# Patient Record
Sex: Male | Born: 1970 | Race: Black or African American | Hispanic: No | Marital: Married | Smoking: Never smoker
Health system: Southern US, Community
[De-identification: ages and names within clinical notes are randomized; demographics above are authoritative.]

## PROBLEM LIST (undated history)

## (undated) DIAGNOSIS — M549 Dorsalgia, unspecified: Secondary | ICD-10-CM

## (undated) DIAGNOSIS — R079 Chest pain, unspecified: Secondary | ICD-10-CM

## (undated) DIAGNOSIS — I10 Essential (primary) hypertension: Secondary | ICD-10-CM

## (undated) DIAGNOSIS — E785 Hyperlipidemia, unspecified: Secondary | ICD-10-CM

## (undated) DIAGNOSIS — Z87898 Personal history of other specified conditions: Secondary | ICD-10-CM

## (undated) DIAGNOSIS — T8859XA Other complications of anesthesia, initial encounter: Secondary | ICD-10-CM

## (undated) DIAGNOSIS — E119 Type 2 diabetes mellitus without complications: Secondary | ICD-10-CM

## (undated) HISTORY — DX: Essential (primary) hypertension: I10

## (undated) HISTORY — DX: Type 2 diabetes mellitus without complications: E11.9

## (undated) HISTORY — DX: Personal history of other specified conditions: Z87.898

## (undated) HISTORY — PX: OTHER SURGICAL HISTORY: SHX169

## (undated) HISTORY — DX: Dorsalgia, unspecified: M54.9

## (undated) HISTORY — PX: LUMBAR FUSION: SHX111

## (undated) HISTORY — PX: LUMBAR LAMINECTOMY: SHX95

## (undated) HISTORY — DX: Hyperlipidemia, unspecified: E78.5

## (undated) HISTORY — DX: Chest pain, unspecified: R07.9

---

## 2000-10-07 ENCOUNTER — Ambulatory Visit (HOSPITAL_BASED_OUTPATIENT_CLINIC_OR_DEPARTMENT_OTHER): Admission: RE | Admit: 2000-10-07 | Discharge: 2000-10-08 | Payer: Self-pay | Admitting: Surgery

## 2005-02-26 ENCOUNTER — Ambulatory Visit: Payer: Self-pay | Admitting: Internal Medicine

## 2005-04-02 ENCOUNTER — Ambulatory Visit: Payer: Self-pay | Admitting: Internal Medicine

## 2005-05-08 ENCOUNTER — Ambulatory Visit: Payer: Self-pay | Admitting: Internal Medicine

## 2005-05-10 ENCOUNTER — Encounter: Admission: RE | Admit: 2005-05-10 | Discharge: 2005-08-08 | Payer: Self-pay | Admitting: Internal Medicine

## 2005-10-23 ENCOUNTER — Ambulatory Visit: Payer: Self-pay | Admitting: Internal Medicine

## 2006-01-01 ENCOUNTER — Ambulatory Visit (HOSPITAL_COMMUNITY): Admission: RE | Admit: 2006-01-01 | Discharge: 2006-01-01 | Payer: Self-pay | Admitting: Surgery

## 2006-05-26 ENCOUNTER — Ambulatory Visit: Payer: Self-pay | Admitting: Internal Medicine

## 2006-05-30 ENCOUNTER — Ambulatory Visit (HOSPITAL_COMMUNITY): Admission: RE | Admit: 2006-05-30 | Discharge: 2006-05-30 | Payer: Self-pay | Admitting: Internal Medicine

## 2006-06-04 ENCOUNTER — Ambulatory Visit (HOSPITAL_COMMUNITY): Admission: RE | Admit: 2006-06-04 | Discharge: 2006-06-04 | Payer: Self-pay | Admitting: Surgery

## 2006-06-19 ENCOUNTER — Ambulatory Visit: Payer: Self-pay | Admitting: Internal Medicine

## 2006-10-22 ENCOUNTER — Ambulatory Visit: Payer: Self-pay | Admitting: Internal Medicine

## 2007-06-11 ENCOUNTER — Telehealth: Payer: Self-pay | Admitting: *Deleted

## 2007-06-11 DIAGNOSIS — E1159 Type 2 diabetes mellitus with other circulatory complications: Secondary | ICD-10-CM | POA: Insufficient documentation

## 2007-06-11 DIAGNOSIS — E1169 Type 2 diabetes mellitus with other specified complication: Secondary | ICD-10-CM

## 2007-06-11 DIAGNOSIS — I152 Hypertension secondary to endocrine disorders: Secondary | ICD-10-CM

## 2007-06-11 DIAGNOSIS — E785 Hyperlipidemia, unspecified: Secondary | ICD-10-CM

## 2007-06-11 DIAGNOSIS — I10 Essential (primary) hypertension: Secondary | ICD-10-CM

## 2007-06-11 HISTORY — DX: Type 2 diabetes mellitus with other specified complication: E11.69

## 2007-06-11 HISTORY — DX: Hypertension secondary to endocrine disorders: I15.2

## 2007-06-26 ENCOUNTER — Ambulatory Visit: Payer: Self-pay | Admitting: Internal Medicine

## 2007-06-26 LAB — CONVERTED CEMR LAB
AST: 44 units/L — ABNORMAL HIGH (ref 0–37)
BUN: 13 mg/dL (ref 6–23)
Basophils Absolute: 0 10*3/uL (ref 0.0–0.1)
Basophils Relative: 0.5 % (ref 0.0–1.0)
Bilirubin, Direct: 0.1 mg/dL (ref 0.0–0.3)
Blood in Urine, dipstick: NEGATIVE
Calcium: 9.8 mg/dL (ref 8.4–10.5)
Cholesterol: 226 mg/dL (ref 0–200)
GFR calc non Af Amer: 56 mL/min
Glucose, Bld: 95 mg/dL (ref 70–99)
HDL: 26.3 mg/dL — ABNORMAL LOW (ref >39.0)
Hemoglobin: 13 g/dL (ref 13.0–17.0)
Ketones, urine, test strip: NEGATIVE
Lymphocytes Relative: 53.6 % — ABNORMAL HIGH (ref 12.0–46.0)
Monocytes Absolute: 0.9 10*3/uL — ABNORMAL HIGH (ref 0.2–0.7)
Neutro Abs: 2.5 10*3/uL (ref 1.4–7.7)
Neutrophils Relative %: 32.7 % — ABNORMAL LOW (ref 43.0–77.0)
Nitrite: NEGATIVE
Platelets: 231 10*3/uL (ref 150–400)
TSH: 0.98 microintl units/mL (ref 0.35–5.50)
Total Bilirubin: 1.1 mg/dL (ref 0.3–1.2)
Total CHOL/HDL Ratio: 8.6
Total Protein: 7.8 g/dL (ref 6.0–8.3)
Urobilinogen, UA: 0.2
VLDL: 29 mg/dL (ref 0–40)
WBC Urine, dipstick: NEGATIVE

## 2007-07-03 ENCOUNTER — Ambulatory Visit: Payer: Self-pay | Admitting: Internal Medicine

## 2007-07-03 DIAGNOSIS — E876 Hypokalemia: Secondary | ICD-10-CM | POA: Insufficient documentation

## 2007-07-23 ENCOUNTER — Encounter: Payer: Self-pay | Admitting: Internal Medicine

## 2007-07-23 ENCOUNTER — Telehealth: Payer: Self-pay | Admitting: Internal Medicine

## 2007-08-19 ENCOUNTER — Ambulatory Visit: Payer: Self-pay | Admitting: Internal Medicine

## 2007-08-19 DIAGNOSIS — R609 Edema, unspecified: Secondary | ICD-10-CM | POA: Insufficient documentation

## 2007-08-19 LAB — CONVERTED CEMR LAB
ALT: 38 units/L (ref 0–53)
Alkaline Phosphatase: 76 units/L (ref 39–117)
Blood in Urine, dipstick: NEGATIVE
Calcium: 9.6 mg/dL (ref 8.4–10.5)
Creatinine, Ser: 1.6 mg/dL — ABNORMAL HIGH (ref 0.4–1.5)
GFR calc Af Amer: 63 mL/min
GFR calc non Af Amer: 52 mL/min
Ketones, urine, test strip: NEGATIVE
Microalb Creat Ratio: 9.4 mg/g (ref 0.0–30.0)
Total Bilirubin: 0.7 mg/dL (ref 0.3–1.2)
Urobilinogen, UA: 0.2

## 2007-09-25 ENCOUNTER — Ambulatory Visit: Payer: Self-pay | Admitting: Internal Medicine

## 2007-09-25 DIAGNOSIS — T887XXA Unspecified adverse effect of drug or medicament, initial encounter: Secondary | ICD-10-CM | POA: Insufficient documentation

## 2007-09-25 LAB — CONVERTED CEMR LAB
Albumin: 3.9 g/dL (ref 3.5–5.2)
Basophils Relative: 0.5 % (ref 0.0–1.0)
Bilirubin, Direct: 0.1 mg/dL (ref 0.0–0.3)
Direct LDL: 171.2 mg/dL
Eosinophils Absolute: 0.2 10*3/uL (ref 0.0–0.6)
HDL: 27.5 mg/dL — ABNORMAL LOW (ref >39.0)
Hemoglobin: 13.1 g/dL (ref 13.0–17.0)
Lymphocytes Relative: 52.7 % — ABNORMAL HIGH (ref 12.0–46.0)
MCHC: 33.9 g/dL (ref 30.0–36.0)
MCV: 83.8 fL (ref 78.0–100.0)
Monocytes Absolute: 1 10*3/uL — ABNORMAL HIGH (ref 0.2–0.7)
RDW: 13.7 % (ref 11.5–14.6)
Total Bilirubin: 1 mg/dL (ref 0.3–1.2)
Total Protein: 8.1 g/dL (ref 6.0–8.3)

## 2007-10-01 ENCOUNTER — Ambulatory Visit: Payer: Self-pay | Admitting: Internal Medicine

## 2007-12-31 ENCOUNTER — Ambulatory Visit: Payer: Self-pay | Admitting: Internal Medicine

## 2007-12-31 LAB — CONVERTED CEMR LAB
ALT: 46 units/L (ref 0–53)
Alkaline Phosphatase: 79 units/L (ref 39–117)
Bilirubin, Direct: 0.2 mg/dL (ref 0.0–0.3)
Cholesterol: 138 mg/dL (ref 0–200)
LDL Cholesterol: 93 mg/dL (ref 0–99)
VLDL: 20 mg/dL (ref 0–40)

## 2008-03-09 ENCOUNTER — Ambulatory Visit: Payer: Self-pay | Admitting: Internal Medicine

## 2008-03-09 LAB — CONVERTED CEMR LAB
CO2: 30 meq/L (ref 19–32)
Calcium: 9.7 mg/dL (ref 8.4–10.5)
Chloride: 105 meq/L (ref 96–112)
GFR calc non Af Amer: 51 mL/min
Glucose, Bld: 99 mg/dL (ref 70–99)
Sodium: 143 meq/L (ref 135–145)

## 2008-05-16 ENCOUNTER — Ambulatory Visit: Payer: Self-pay | Admitting: Internal Medicine

## 2008-11-07 ENCOUNTER — Ambulatory Visit: Payer: Self-pay | Admitting: Internal Medicine

## 2009-01-19 ENCOUNTER — Ambulatory Visit: Payer: Self-pay | Admitting: Internal Medicine

## 2009-01-19 ENCOUNTER — Telehealth: Payer: Self-pay | Admitting: Internal Medicine

## 2009-01-19 DIAGNOSIS — J069 Acute upper respiratory infection, unspecified: Secondary | ICD-10-CM | POA: Insufficient documentation

## 2009-01-30 ENCOUNTER — Ambulatory Visit: Payer: Self-pay | Admitting: Internal Medicine

## 2009-01-30 LAB — CONVERTED CEMR LAB
Calcium: 9.3 mg/dL (ref 8.4–10.5)
Chloride: 106 meq/L (ref 96–112)
Cholesterol, target level: 200 mg/dL
GFR calc Af Amer: 72 mL/min
GFR calc non Af Amer: 60 mL/min
Potassium: 3.4 meq/L — ABNORMAL LOW (ref 3.5–5.1)
Sodium: 141 meq/L (ref 135–145)
Triglycerides: 101 mg/dL (ref 0–149)

## 2009-03-22 ENCOUNTER — Telehealth: Payer: Self-pay | Admitting: Internal Medicine

## 2009-05-01 ENCOUNTER — Ambulatory Visit: Payer: Self-pay | Admitting: Internal Medicine

## 2009-05-01 LAB — CONVERTED CEMR LAB
ALT: 40 units/L (ref 0–53)
AST: 38 units/L — ABNORMAL HIGH (ref 0–37)
Alkaline Phosphatase: 82 units/L (ref 39–117)
Bilirubin Urine: NEGATIVE
Eosinophils Absolute: 0.2 10*3/uL (ref 0.0–0.7)
Glucose, Urine, Semiquant: NEGATIVE
Hemoglobin: 12.8 g/dL — ABNORMAL LOW (ref 13.0–17.0)
Ketones, urine, test strip: NEGATIVE
LDL Cholesterol: 92 mg/dL (ref 0–99)
MCHC: 33.5 g/dL (ref 30.0–36.0)
MCV: 83.4 fL (ref 78.0–100.0)
Neutrophils Relative %: 37.9 % — ABNORMAL LOW (ref 43.0–77.0)
Nitrite: NEGATIVE
Platelets: 193 10*3/uL (ref 150.0–400.0)
RBC: 4.58 M/uL (ref 4.22–5.81)
RDW: 13.8 % (ref 11.5–14.6)
Sodium: 136 meq/L (ref 135–145)
Total Bilirubin: 0.8 mg/dL (ref 0.3–1.2)
Total CHOL/HDL Ratio: 5
Urobilinogen, UA: 1
VLDL: 19.6 mg/dL (ref 0.0–40.0)

## 2009-05-08 ENCOUNTER — Ambulatory Visit: Payer: Self-pay | Admitting: Internal Medicine

## 2009-05-08 DIAGNOSIS — D6489 Other specified anemias: Secondary | ICD-10-CM | POA: Insufficient documentation

## 2009-05-10 ENCOUNTER — Telehealth: Payer: Self-pay | Admitting: Internal Medicine

## 2009-08-14 ENCOUNTER — Ambulatory Visit: Payer: Self-pay | Admitting: Internal Medicine

## 2009-08-14 DIAGNOSIS — D518 Other vitamin B12 deficiency anemias: Secondary | ICD-10-CM | POA: Insufficient documentation

## 2009-08-14 LAB — CONVERTED CEMR LAB
Basophils Absolute: 0 10*3/uL (ref 0.0–0.1)
Folate: 17.6 ng/mL
Lymphs Abs: 2.4 10*3/uL (ref 0.7–4.0)
MCHC: 32.9 g/dL (ref 30.0–36.0)
Saturation Ratios: 18.9 % — ABNORMAL LOW (ref 20.0–50.0)
Vitamin B-12: 221 pg/mL (ref 211–911)
WBC: 6.7 10*3/uL (ref 4.5–10.5)

## 2010-01-19 ENCOUNTER — Ambulatory Visit: Payer: Self-pay | Admitting: Internal Medicine

## 2010-01-19 LAB — CONVERTED CEMR LAB
Basophils Absolute: 0.1 10*3/uL (ref 0.0–0.1)
Basophils Relative: 0.8 % (ref 0.0–3.0)
Eosinophils Absolute: 0.2 10*3/uL (ref 0.0–0.7)
Folate: 12.5 ng/mL
HCT: 40.3 % (ref 39.0–52.0)
Lymphocytes Relative: 40.2 % (ref 12.0–46.0)
Lymphs Abs: 3.2 10*3/uL (ref 0.7–4.0)
MCHC: 32.7 g/dL (ref 30.0–36.0)
Monocytes Absolute: 1.1 10*3/uL — ABNORMAL HIGH (ref 0.1–1.0)
Monocytes Relative: 14.3 % — ABNORMAL HIGH (ref 3.0–12.0)
RBC: 4.74 M/uL (ref 4.22–5.81)
Vitamin B-12: 300 pg/mL (ref 211–911)
WBC: 8 10*3/uL (ref 4.5–10.5)

## 2010-03-23 ENCOUNTER — Telehealth: Payer: Self-pay | Admitting: Internal Medicine

## 2010-05-14 ENCOUNTER — Ambulatory Visit: Payer: Self-pay | Admitting: Internal Medicine

## 2010-05-14 LAB — CONVERTED CEMR LAB
AST: 35 units/L (ref 0–37)
Albumin: 4.2 g/dL (ref 3.5–5.2)
Alkaline Phosphatase: 96 units/L (ref 39–117)
Bilirubin, Direct: 0.1 mg/dL (ref 0.0–0.3)
Blood in Urine, dipstick: NEGATIVE
Calcium: 9.2 mg/dL (ref 8.4–10.5)
Glucose, Urine, Semiquant: NEGATIVE
HCT: 39.3 % (ref 39.0–52.0)
Hemoglobin: 13 g/dL (ref 13.0–17.0)
Lymphocytes Relative: 47.4 % — ABNORMAL HIGH (ref 12.0–46.0)
Lymphs Abs: 3.4 10*3/uL (ref 0.7–4.0)
MCHC: 33.2 g/dL (ref 30.0–36.0)
MCV: 84.5 fL (ref 78.0–100.0)
Monocytes Relative: 12.1 % — ABNORMAL HIGH (ref 3.0–12.0)
Neutrophils Relative %: 37.6 % — ABNORMAL LOW (ref 43.0–77.0)
Nitrite: NEGATIVE
PSA: 1.57 ng/mL (ref 0.10–4.00)
Platelets: 225 10*3/uL (ref 150.0–400.0)
RDW: 14.9 % — ABNORMAL HIGH (ref 11.5–14.6)
Specific Gravity, Urine: >=1.03
TSH: 1.12 microintl units/mL (ref 0.35–5.50)
Total CHOL/HDL Ratio: 5
Triglycerides: 86 mg/dL (ref 0.0–149.0)
WBC Urine, dipstick: NEGATIVE
pH: 5.5

## 2010-05-18 ENCOUNTER — Ambulatory Visit: Payer: Self-pay | Admitting: Internal Medicine

## 2010-10-23 ENCOUNTER — Telehealth: Payer: Self-pay | Admitting: Internal Medicine

## 2010-11-05 ENCOUNTER — Ambulatory Visit: Payer: Self-pay | Admitting: Internal Medicine

## 2010-11-05 LAB — CONVERTED CEMR LAB
ALT: 44 units/L (ref 0–53)
LDL Cholesterol: 89 mg/dL (ref 0–99)
Total CHOL/HDL Ratio: 5

## 2010-11-20 ENCOUNTER — Ambulatory Visit
Admission: RE | Admit: 2010-11-20 | Discharge: 2010-11-20 | Payer: Self-pay | Source: Home / Self Care | Attending: Internal Medicine | Admitting: Internal Medicine

## 2010-12-16 LAB — CONVERTED CEMR LAB
Folate: >20 ng/mL
Hgb F Quant: 0 % (ref 0.0–2.0)
Hgb S Quant: 0 % (ref 0.0–0.0)
Iron: 96 ug/dL (ref 42–165)
Transferrin: 251.7 mg/dL (ref 212.0–360.0)
Vitamin B-12: 226 pg/mL (ref 211–911)

## 2010-12-18 NOTE — Progress Notes (Signed)
Summary: REQUEST FOR SAMPLES / Rx  Phone Note Call from Patient   Caller: Patient   [phone] Summary of Call: Pt would like to have samples of Lipitor 20mg  if available..... If no samples are available then he would like to have a Rx for Lipitor 20mg  (30-day supply) [facility] [address]Marland KitchenMarland Kitchen Pt has changed insurance and his new policy will not go into effect for one more week / 7 days.Marland KitchenMarland KitchenTherefore, he is only requesting samples or Rx for enough of med to do him till his insurance goes into effect.  Initial call taken by: Debbra Riding,  October 23, 2010 3:30 PM    Prescriptions: LIPITOR 20 MG  TABS (ATORVASTATIN CALCIUM) one by mouth daily  #7 x 2   Entered by:   Willy Eddy, LPN   Authorized by:   Stacie Glaze MD   Signed by:   Willy Eddy, LPN on 09/81/1914   Method used:   [facility]ically [facility]        [facility] [address] [phone]* (retail)       [address]       [place], [place]  [address]       Ph: [phone] or [phone]       Fax: [phone]   RxID:   [id number]

## 2010-12-18 NOTE — Letter (Signed)
Summary: Out of Work  Adult nurse at [employer]  [address]   [place], [place] [address]   Phone: [phone]  Fax: [phone]    January 19, 2010   Employee:  Vincent Cantu    To Whom It May Concern:   For Medical reasons, please excuse the above named employee from work for the following dates:  Start:   01-16-2010  End:   01-19-2010  If you need additional information, please feel free to contact our office.         Sincerely,    Raechel Ache, RN

## 2010-12-18 NOTE — Assessment & Plan Note (Signed)
Summary: cpx/njr ok to create a slot/njr   Vital Signs:  Patient profile:   40 year old male Height:      75 inches Weight:      326 pounds BMI:     40.89 Temp:     98.2 degrees F oral Pulse rate:   76 / minute Resp:     14 per minute BP sitting:   124 / 80  (left arm) Cuff size:   large  Vitals Entered By: Willy Eddy, LPN (May 18, 6044 8:55 AM) CC: cpx   CC:  cpx.  History of Present Illness: The pt was asked about all immunizations, health maint. 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) 2)  Other Specified Anemias  (ICD-285.8) 3)  Uri  (ICD-465.9) 4)  Uns Advrs Eff Uns Rx Medicinal&biological Sbstnc  (ICD-995.20) 5)  Symptom, Edema  (ICD-782.3) 6)  Preventive Health Care  (ICD-V70.0) 7)  Hypokalemia, Mild  (ICD-276.8) 8)  Family History Seizures  (ICD-V17.2) 9)  Hypertension  (ICD-401.9) 10)  Hyperlipidemia  (ICD-272.4)  Current Problems (verified): 1)  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 Once Daily 2)  Hydrochlorothiazide 25 Mg  Tabs (Hydrochlorothiazide) .... Once Daily 3)  Azor 10-40 Mg Tabs (Amlodipine-Olmesartan) .Marland Kitchen.. 1 Once Daily 4)  Lipitor 20 Mg  Tabs (Atorvastatin Calcium) .... One By Mouth Daily 5)  Folbee 2.5-25-1 Mg Tabs (Folic Acid-Vit B6-Vit  B12) .... One By Mouth Daily  Current Medications (verified): 1)  Toprol Xl 100 Mg  Tb24 (Metoprolol Succinate) .... Once Daily 2)  Hydrochlorothiazide 25 Mg  Tabs (Hydrochlorothiazide) .... Once Daily 3)  Azor 10-40 Mg Tabs (Amlodipine-Olmesartan) .Marland Kitchen.. 1 Once Daily 4)  Lipitor 20 Mg  Tabs (Atorvastatin Calcium) .... One By Mouth Daily 5)  Folbee 2.5-25-1 Mg Tabs (Folic Acid-Vit B6-Vit B12) .... One By Mouth Daily  Allergies (verified): No Known Drug Allergies  Past History:  Family History: Last updated: 08/19/2007 Family History Hypertension Family History Seizures No family hx of DM or renal dz  Social History: Last updated: 06/11/2007 Occupation:[employer] Never Smoked Alcohol use-no  Risk Factors: Smoking Status: never (05/18/2010)  Past medical, surgical, family and social histories (including risk factors) reviewed, and no changes noted (except as noted below).  Past Medical History: Reviewed history from 06/11/2007 and no changes required. Hyperlipidemia Hypertension  Past Surgical History: Reviewed history from 07/03/2007 and no changes required. Inguinal herniorrhaphy Lumbar laminectomy Lumbar fusion arthroscopic to knee for meniscus tear  Family History: Reviewed history from 08/19/2007 and no changes required. Family History Hypertension Family History Seizures No family hx of DM or renal dz  Social History: Reviewed history from 06/11/2007 and no changes required. Occupation:[employer] Never Smoked Alcohol use-no  Review of Systems  The patient denies anorexia,  fever, weight loss, weight gain, vision loss, decreased hearing, hoarseness, chest pain, syncope, dyspnea on exertion, peripheral edema, prolonged cough, headaches, hemoptysis, abdominal pain, melena, hematochezia, severe indigestion/heartburn, hematuria, incontinence, genital sores, muscle weakness, suspicious skin lesions, transient blindness, difficulty  walking, depression, unusual weight change, abnormal bleeding, enlarged lymph nodes, angioedema, breast masses, and testicular masses.    Physical Exam  General:  overweight-appearing.  120/88 Head:  Normocephalic and atraumatic without obvious abnormalities. No apparent alopecia or balding. Eyes:  No corneal or conjunctival inflammation noted. EOMI. Perrla. Funduscopic exam benign, without hemorrhages, exudates or papilledema. Vision grossly normal. Ears:  R ear normal and L ear normal.   Nose:  no external deformity and no nasal discharge.   Neck:  No deformities, masses, or tenderness noted. Lungs:  Normal respiratory effort, chest expands symmetrically. Lungs are clear to auscultation, no crackles or wheezes. Heart:  Normal rate and regular rhythm. S1 and S2 normal without gallop, murmur, click, rub or other extra sounds. Msk:  No deformity or scoliosis noted of thoracic or lumbar spine.   Extremities:  No clubbing, cyanosis, edema, or deformity noted with normal full range of motion of all joints.   Neurologic:  No cranial nerve deficits noted. Station and gait are normal. Plantar reflexes are down-going bilaterally. DTRs are symmetrical throughout. Sensory, motor and coordinative functions appear intact.   Impression & Recommendations:  Problem # 1:  PREVENTIVE HEALTH CARE (ICD-V70.0) The pt was asked about all immunizations, health maint. services that are appropriate to their age and was given guidance on diet exercize  and weight management  Td Booster: Td (11/18/2002)   Flu Vax: Fluvax 3+ (08/14/2009)   Chol: 145 (01/19/2010)   HDL: 33.30 (01/19/2010)   LDL: 92 (05/01/2009)   TG: 98.0 (05/01/2009) TSH: 1.14 (05/01/2009)   PSA: 1.09 (05/01/2009)  Discussed using sunscreen, use of alcohol, drug use, self testicular exam, routine dental care, routine eye care, routine physical exam, seat belts, multiple vitamins, osteoporosis prevention, adequate calcium intake in diet, and  recommendations for immunizations.  Discussed exercise and checking cholesterol.  Discussed gun safety, safe sex, and contraception. Also recommend checking PSA.  Problem # 2:  HYPOKALEMIA, MILD (ICD-276.8) resolved  Complete Medication List: 1)  Toprol Xl 100 Mg Tb24 (Metoprolol succinate) .... Once daily 2)  Hydrochlorothiazide 25 Mg Tabs (Hydrochlorothiazide) .... Once daily 3)  Azor 10-40 Mg Tabs (Amlodipine-olmesartan) .Marland Kitchen.. 1 once daily 4)  Lipitor 20 Mg Tabs (Atorvastatin calcium) .... One by mouth daily 5)  Folbee 2.5-25-1 Mg Tabs (Folic acid-vit b6-vit b12) .... One by mouth daily  Patient Instructions: 1)  Please schedule a follow-up appointment in 6 months. 2)  Hepatic Panel prior to visit, ICD-9:995.20 3)  Lipid Panel prior to visit, ICD-9:272.4   Prevention & Chronic Care Immunizations   Influenza vaccine: Fluvax 3+  (08/14/2009)   Influenza vaccine due: 07/19/2010    Tetanus booster: 11/18/2002: Td   Tetanus booster due: 11/18/2012    Pneumococcal vaccine: Not documented  Other Screening   Smoking status: never  (05/18/2010)  Lipids   Total Cholesterol: 145  (01/19/2010)   Lipid panel action/deferral: Lipid Panel ordered   LDL: 92  (05/01/2009)   LDL Direct: 110.0  (01/19/2010)   HDL: 33.30  (01/19/2010)   Triglycerides: 98.0  (05/01/2009)    SGOT (AST): 38  (05/01/2009)   SGPT (ALT): 40  (05/01/2009)   Alkaline phosphatase: 82  (05/01/2009)   Total bilirubin: 0.8  (05/01/2009)    Lipid flowsheet reviewed?: Yes  Progress toward LDL goal: At goal    Stage of readiness to change (lipid management): Action  Hypertension   Last Blood Pressure: 124 / 80  (05/18/2010)   Serum creatinine: 1.6  (05/01/2009)   Serum potassium 3.4  (05/01/2009)    Hypertension flowsheet reviewed?: Yes   Progress toward BP goal: At goal  Self-Management Support :    Patient will work on the following items until the next clinic visit to reach self-care goals:      Medications and monitoring: take my medicines every day  (05/18/2010)     Eating: use fresh or frozen vegetables  (05/18/2010)     Activity: take a 30 minute walk every day  (05/18/2010)    Hypertension self-management support: BP self-monitoring log  (05/18/2010)    Lipid self-management support: Not documented

## 2010-12-18 NOTE — Progress Notes (Signed)
Summary: refill metoprolol  Phone Note Refill Request Message from:  Fax from Pharmacy on Mar 23, 2010 9:56 AM  Refills Requested: Medication #1:  TOPROL XL 100 MG  TB24 once daily [facility] fax   Method Requested: Fax to Local Pharmacy Initial call taken by: Duard Brady LPN,  Mar 23, 4009 9:57 AM    Prescriptions: TOPROL XL 100 MG  TB24 (METOPROLOL SUCCINATE) once daily  #90 x 3   Entered by:   Duard Brady LPN   Authorized by:   Gordy Savers  MD   Signed by:   Duard Brady LPN on 27/25/3664   Method used:   Faxed to ...       [facility]* (mail-order)             ,          Ph: [phone]       Fax: [phone]   RxID:   [id number]

## 2010-12-18 NOTE — Assessment & Plan Note (Signed)
Summary: 6 month rov/njr   Vital Signs:  Patient profile:   40 year old male Weight:      329 pounds BMI:     41.27 Temp:     98.4 degrees F oral BP sitting:   110 / 80  (left arm) Cuff size:   large  Vitals Entered By: Raechel Ache, RN (January 19, 2010 12:02 PM) CC: 6 mo F/u; c/o some congestion. Needs FMLA., Hypertension Management, Lipid Management   CC:  6 mo F/u; c/o some congestion. Needs FMLA., Hypertension Management, and Lipid Management.  Hypertension History:      He denies headache, chest pain, palpitations, dyspnea with exertion, orthopnea, PND, peripheral edema, visual symptoms, neurologic problems, syncope, and side effects from treatment.        Positive major cardiovascular risk factors include hyperlipidemia and hypertension.  Negative major cardiovascular risk factors include male age less than 26 years old and non-tobacco-user status.        Further assessment for target organ damage reveals no history of ASHD, stroke/TIA, or peripheral vascular disease.    Lipid Management History:      Positive NCEP/ATP III risk factors include HDL cholesterol less than 40 and hypertension.  Negative NCEP/ATP III risk factors include male age less than 16 years old, non-tobacco-user status, no ASHD (atherosclerotic heart disease), no prior stroke/TIA, no peripheral vascular disease, and no history of aortic aneurysm.      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 Once Daily 2)  Hydrochlorothiazide 25 Mg  Tabs  (Hydrochlorothiazide) .... Once Daily 3)  Azor 10-40 Mg Tabs (Amlodipine-Olmesartan) .Marland Kitchen.. 1 Once Daily 4)  Lipitor 20 Mg  Tabs (Atorvastatin Calcium) .... One By Mouth Daily  Current Medications (verified): 1)  Toprol Xl 100 Mg  Tb24 (Metoprolol Succinate) .... Once Daily 2)  Hydrochlorothiazide 25 Mg  Tabs (Hydrochlorothiazide) .... Once Daily 3)  Azor 10-40 Mg Tabs (Amlodipine-Olmesartan) .Marland Kitchen.. 1 Once Daily 4)  Lipitor 20 Mg  Tabs (Atorvastatin Calcium) .... One By Mouth Daily 5)  Folbee 2.5-25-1 Mg Tabs (Folic Acid-Vit B6-Vit B12) .... One By Mouth Daily  Allergies (verified): No Known Drug Allergies  Past History:  Family History: Last updated: 08/19/2007 Family History Hypertension Family History Seizures No family hx of DM or renal dz  Social History: Last updated: 06/11/2007 Occupation:[employer] Never Smoked Alcohol use-no  Risk Factors: Smoking Status: never (06/11/2007)  Past medical, surgical, family and social histories (including risk factors) reviewed, and no changes noted (except as noted below).  Past Medical History: Reviewed history from 06/11/2007 and no changes required. Hyperlipidemia Hypertension  Past Surgical History: Reviewed history from 07/03/2007 and no changes required. Inguinal herniorrhaphy Lumbar laminectomy Lumbar fusion arthroscopic to knee for meniscus tear  Family History: Reviewed history from 08/19/2007 and no changes required. Family History Hypertension Family History Seizures No family hx of DM or renal dz  Social History: Reviewed history from 06/11/2007 and no changes required. Occupation:[employer] Never Smoked Alcohol use-no  Review of  Systems  The patient denies anorexia, fever, weight loss, weight gain, vision loss, decreased hearing, hoarseness, chest pain, syncope, dyspnea on exertion, peripheral edema, prolonged cough, headaches, hemoptysis, abdominal pain, melena, hematochezia,  severe indigestion/heartburn, hematuria, incontinence, genital sores, muscle weakness, suspicious skin lesions, transient blindness, difficulty walking, depression, unusual weight change, abnormal bleeding, enlarged lymph nodes, angioedema, breast masses, and testicular masses.    Physical Exam  General:  overweight-appearing.  120/88 Head:  Normocephalic and atraumatic without obvious abnormalities. No apparent alopecia or balding. Eyes:  No corneal or conjunctival inflammation noted. EOMI. Perrla. Funduscopic exam benign, without hemorrhages, exudates or papilledema. Vision grossly normal. Ears:  R ear normal and L ear normal.   Nose:  no external deformity and no nasal discharge.   Lungs:  Normal respiratory effort, chest expands symmetrically. Lungs are clear to auscultation, no crackles or wheezes. Heart:  Normal rate and regular rhythm. S1 and S2 normal without gallop, murmur, click, rub or other extra sounds. Genitalia:  Testes bilaterally descended without nodularity, tenderness or masses. No scrotal masses or lesions. No penis lesions or urethral discharge. Prostate:  Prostate gland firm and smooth, no enlargement, nodularity, tenderness, mass, asymmetry or induration.   Impression & Recommendations:  Problem # 1:  OTHER VITAMIN B12 DEFICIENCY ANEMIA (ICD-281.1)  Hgb: 13.1 (08/14/2009)   Hct: 39.8 (08/14/2009)   Platelets: 193.0 (08/14/2009) RBC: 4.73 (08/14/2009)   RDW: 13.8 (08/14/2009)   WBC: 6.7 (08/14/2009) MCV: 84.2 (08/14/2009)   MCHC: 32.9 (08/14/2009) Iron: 61 (08/14/2009)   % Sat: 18.9 (08/14/2009) B12: 221 (08/14/2009)   Folate: 17.6 (08/14/2009)   TSH: 1.14 (05/01/2009)  Orders: Venipuncture ([address]) TLB-B12 + Folate Pnl (82746_82607-B12/FOL)  His updated medication list for this problem includes:    Folbee 2.5-25-1 Mg Tabs (Folic acid-vit b6-vit b12) ..... One by mouth daily  Problem # 2:  HYPERTENSION (ICD-401.9)  His updated medication list for this problem  includes:    Toprol Xl 100 Mg Tb24 (Metoprolol succinate) ..... Once daily    Hydrochlorothiazide 25 Mg Tabs (Hydrochlorothiazide) ..... Once daily    Azor 10-40 Mg Tabs (Amlodipine-olmesartan) .Marland Kitchen... 1 once daily  Orders: TLB-CBC Platelet - w/Differential (85025-CBCD)  BP today: 110/80 Prior BP: 136/84 (08/14/2009)  Prior 10 Yr Risk Heart Disease: 6 % (01/30/2009)  Labs Reviewed: K+: 3.4 (05/01/2009) Creat: : 1.6 (05/01/2009)   Chol: 138 (05/01/2009)   HDL: 26.90 (05/01/2009)   LDL: 92 (05/01/2009)   TG: 98.0 (05/01/2009)  Problem # 3:  HYPERLIPIDEMIA (ICD-272.4)  His updated medication list for this problem includes:    Lipitor 20 Mg Tabs (Atorvastatin calcium) ..... One by mouth daily  Orders: TLB-Cholesterol, HDL (83718-HDL) TLB-Cholesterol, Direct LDL (83721-DIRLDL) TLB-Cholesterol, Total (82465-CHO)  Labs Reviewed: SGOT: 38 (05/01/2009)   SGPT: 40 (05/01/2009)  Lipid Goals: Chol Goal: 200 (01/30/2009)   HDL Goal: 40 (01/30/2009)   LDL Goal: 130 (01/30/2009)   TG Goal: 150 (01/30/2009)  Prior 10 Yr Risk Heart Disease: 6 % (01/30/2009)   HDL:26.90 (05/01/2009), 23.8 (01/30/2009)  LDL:92 (05/01/2009), 94 (01/30/2009)  Chol:138 (05/01/2009), 138 (01/30/2009)  Trig:98.0 (05/01/2009), 101 (01/30/2009)  Complete Medication List: 1)  Toprol Xl 100 Mg Tb24 (Metoprolol succinate) .... Once daily 2)  Hydrochlorothiazide 25 Mg Tabs (Hydrochlorothiazide) .... Once daily 3)  Azor 10-40 Mg Tabs (Amlodipine-olmesartan) .Marland Kitchen.. 1 once daily 4)  Lipitor 20 Mg Tabs (Atorvastatin calcium) .... One by mouth daily 5)  Folbee 2.5-25-1 Mg Tabs (Folic acid-vit b6-vit b12) .... One by mouth daily  Hypertension Assessment/Plan:  The patient's hypertensive risk group is category B: At least one risk factor (excluding diabetes) with no target organ damage.  His calculated 10 year risk of coronary heart disease is 4 %.  Today's blood pressure is 110/80.  His blood pressure goal is <  140/90.  Lipid Assessment/Plan:      Based on NCEP/ATP III, the patient's risk factor category is "2 or more risk factors and a calculated 10 year CAD risk of < 20%".  The patient's lipid goals are as follows: Total cholesterol goal is 200; LDL cholesterol goal is 130; HDL cholesterol goal is 40; Triglyceride goal is 150.  His LDL cholesterol goal has been met.    Patient Instructions: 1)  June and August  range for CPX may place at 8 am OR 9 am ON DAYS i START AT 9  FOR 30 MG MAY CREATE CPX SPOT Prescriptions: FOLBEE 2.5-25-1 MG TABS (FOLIC ACID-VIT B6-VIT B12) one by mouth daily  #30 x 11   Entered and Authorized by:   Stacie Glaze MD   Signed by:   Stacie Glaze MD on 01/19/2010   Method used:   [facility]        [facility] [address] [phone]* (retail)       [address]       [place], [place]  [address]       Ph: [phone] or [phone]       Fax: [phone]   RxID:   [id number]

## 2010-12-20 NOTE — Assessment & Plan Note (Signed)
Summary: 6 month follow up/cjr----PT RSC (BMP) // RS   Vital Signs:  Patient profile:   40 year old male Height:      75 inches Weight:      322 pounds BMI:     40.39 Temp:     98.2 degrees F oral Pulse rate:   68 / minute Resp:     14 per minute BP sitting:   140 / 88  (left arm) Cuff size:   large  Vitals Entered By: Willy Eddy, LPN (November 20, 2010 9:19 AM) CC: roa -labs- has not taken meds this am, Lipid Management Is Patient Diabetic? No   Primary Care Provider:  Stacie Glaze MD  CC:  roa -labs- has not taken meds this am and Lipid Management.  History of Present Illness:  Hyperlipidemia Follow-Up      This is a 40 year old man who presents for Hyperlipidemia follow-up.  The patient denies muscle aches, GI upset, abdominal pain, flushing, itching, constipation, diarrhea, and fatigue.  The patient denies the following symptoms: chest pain/pressure, exercise intolerance, dypsnea, palpitations, syncope, and pedal edema.  Compliance with medications (by patient report) has been near 100%.  Dietary compliance has been excellent.  The patient reports exercising daily.  Adjunctive measures currently used by the patient include fish oil supplements.    Lipid Management History:      Positive NCEP/ATP III risk factors include HDL cholesterol less than 40 and hypertension.  Negative NCEP/ATP III risk factors include male age less than 63 years old, non-tobacco-user status, no ASHD (atherosclerotic heart disease), no prior stroke/TIA, no peripheral vascular disease, and no history of aortic aneurysm.     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 Once Daily 2)  Hydrochlorothiazide 25 Mg  Tabs (Hydrochlorothiazide) .... Once Daily 3)  Azor 10-40 Mg Tabs (Amlodipine-Olmesartan) .Marland Kitchen.. 1 Once Daily 4)  Lipitor 20 Mg  Tabs (Atorvastatin Calcium) .... One By Mouth Daily 5)  Folbee 2.5-25-1 Mg Tabs (Folic Acid-Vit B6-Vit B12) .... One By Mouth Daily  Allergies (verified): No Known Drug Allergies  Past History:  Family History: Last updated: 08/19/2007 Family History Hypertension Family History Seizures No family hx of DM or renal dz  Social History: Last updated: 06/11/2007 Occupation:[employer] Single Never Smoked Alcohol use-no  Risk Factors: Smoking Status: never (11/20/2010)  Past medical, surgical, family and social histories (including risk factors) reviewed, and no changes noted (except as noted below).  Past Medical History: Reviewed history from 06/11/2007 and no changes required. Hyperlipidemia Hypertension  Past Surgical History: Reviewed history from 07/03/2007 and no changes required. Inguinal herniorrhaphy Lumbar laminectomy Lumbar fusion arthroscopic to knee for meniscus tear  Family History: Reviewed history from 08/19/2007 and no changes required. Family History Hypertension Family History Seizures No family hx of DM or renal dz  Social History: Reviewed history from 06/11/2007 and no changes  required. Occupation:[employer] Single Never Smoked Alcohol use-no  Review of Systems  The patient denies anorexia, fever, weight loss, weight gain, vision loss, decreased hearing, hoarseness, chest pain, syncope, dyspnea on exertion, peripheral edema, prolonged cough, headaches, hemoptysis, abdominal  pain, melena, hematochezia, severe indigestion/heartburn, hematuria, incontinence, genital sores, muscle weakness, suspicious skin lesions, transient blindness, difficulty walking, depression, unusual weight change, abnormal bleeding, enlarged lymph nodes, angioedema, breast masses, and testicular masses.    Physical Exam  General:  overweight-appearing.  Head:  Normocephalic and atraumatic without obvious abnormalities. No apparent alopecia or balding. Eyes:  No corneal or conjunctival inflammation noted. EOMI. Perrla. Funduscopic exam benign, without hemorrhages, exudates or papilledema. Vision grossly normal. Nose:  no external deformity and no nasal discharge.   Neck:  No deformities, masses, or tenderness noted. Lungs:  Normal respiratory effort, chest expands symmetrically. Lungs are clear to auscultation, no crackles or wheezes. Heart:  Normal rate and regular rhythm. S1 and S2 normal without gallop, murmur, click, rub or other extra sounds. Abdomen:  soft and normal bowel sounds.   Msk:  No deformity or scoliosis noted of thoracic or lumbar spine.   Pulses:  R and L carotid,radial,femoral,dorsalis pedis and posterior tibial pulses are full and equal bilaterally Extremities:  No clubbing, cyanosis, edema, or deformity noted with normal full range of motion of all joints.     Impression & Recommendations:  Problem # 1:  HYPERTENSION (ICD-401.9) Assessment Deteriorated missed dose this AM but has been stable His updated medication list for this problem includes:    Toprol Xl 100 Mg Tb24 (Metoprolol succinate) ..... Once daily    Hydrochlorothiazide 25 Mg Tabs (Hydrochlorothiazide) ..... Once daily    Azor 10-40 Mg Tabs (Amlodipine-olmesartan) .Marland Kitchen... 1 once daily  BP today: 140/88 Prior BP: 124/80 (05/18/2010)  10 Yr Risk Heart Disease: 6 % Prior 10 Yr Risk Heart Disease: 4 % (01/19/2010)  Labs Reviewed: K+: 3.8 (05/14/2010) Creat: : 1.5 (05/14/2010)   Chol: 141 (11/05/2010)    HDL: 28.20 (11/05/2010)   LDL: 89 (11/05/2010)   TG: 117.0 (11/05/2010)  Problem # 2:  HYPERLIPIDEMIA (ICD-272.4) Assessment: Improved  His updated medication list for this problem includes:    Lipitor 20 Mg Tabs (Atorvastatin calcium) ..... One by mouth daily  Labs Reviewed: SGOT: 33 (11/05/2010)   SGPT: 44 (11/05/2010)  Lipid Goals: Chol Goal: 200 (01/30/2009)   HDL Goal: 40 (01/30/2009)   LDL Goal: 130 (01/30/2009)   TG Goal: 150 (01/30/2009)  10 Yr Risk Heart Disease: 6 % Prior 10 Yr Risk Heart Disease: 4 % (01/19/2010)   HDL:28.20 (11/05/2010), 29.50 (05/14/2010)  LDL:89 (11/05/2010), 105 (04/54/0981)  Chol:141 (11/05/2010), 152 (05/14/2010)  Trig:117.0 (11/05/2010), 86.0 (05/14/2010)  Problem # 3:  SYMPTOM, EDEMA (ICD-782.3) Assessment: Unchanged resolved His updated medication list for this problem includes:    Hydrochlorothiazide 25 Mg Tabs (Hydrochlorothiazide) ..... Once daily  Discussed elevation of the legs, use of compression stockings, sodium restiction, and medication use.   Problem # 4:  OTHER VITAMIN B12 DEFICIENCY ANEMIA (ICD-281.1) Assessment: Unchanged  His updated medication list for this problem includes:    Folbee 2.5-25-1 Mg Tabs (Folic acid-vit b6-vit b12) ..... One by mouth daily  Hgb: 13.0 (05/14/2010)   Hct: 39.3 (05/14/2010)   Platelets: 225.0 (05/14/2010) RBC: 4.65 (05/14/2010)   RDW: 14.9 (05/14/2010)   WBC: 7.1 (05/14/2010) MCV: 84.5 (05/14/2010)   MCHC: 33.2 (05/14/2010) Iron: 61 (08/14/2009)   % Sat: 18.9 (08/14/2009) B12: 300 (01/19/2010)   Folate: 12.5 (01/19/2010)  TSH: 1.12 (05/14/2010)  Complete Medication List: 1)  Toprol Xl 100 Mg Tb24 (Metoprolol succinate) .... Once daily 2)  Hydrochlorothiazide 25 Mg Tabs (Hydrochlorothiazide) .... Once daily 3)  Azor 10-40 Mg Tabs (Amlodipine-olmesartan) .Marland Kitchen.. 1 once daily 4)  Lipitor 20 Mg Tabs (Atorvastatin calcium) .... One by mouth daily 5)  Folbee 2.5-25-1 Mg Tabs (Folic acid-vit b6-vit b12)  .... One by mouth daily  Lipid Assessment/Plan:      Based on NCEP/ATP III, the patient's risk factor category is "2 or more risk factors and a calculated 10 year CAD risk of < 20%".  The patient's lipid goals are as follows: Total cholesterol goal is 200; LDL cholesterol goal is 130; HDL cholesterol goal is 40; Triglyceride goal is 150.  His LDL cholesterol goal has been met.    Patient Instructions: 1)  July for CPX   Orders Added: 1)  Est. Patient Level IV [47829]  Appended Document: Orders Update    Clinical Lists Changes  Orders: Added new Service order of Admin 1st Vaccine (56213) - Signed Added new Service order of Flu Vaccine 15yrs + [id number]) - Signed Observations: Added new observation of ROS: Flu Vaccine Consent Questions     Do you have a history of severe allergic reactions to this vaccine? no    Any prior history of allergic reactions to egg and/or gelatin? no    Do you have a sensitivity to the preservative Thimersol? no    Do you have a past history of Guillan-Barre Syndrome? no    Do you currently have an acute febrile illness? no    Have you ever had a severe reaction to latex? no    Vaccine information given and explained to patient? yes    Are you currently pregnant? no    Lot Number:[id number]   Exp Date:05/18/2011   Site Given  Left Deltoid IM given by C.Wyrick,cma (11/20/2010 11:07) Added new observation of FLU VAX VIS: 06/12/10 version (11/20/2010 11:07) Added new observation of FLU VAXLOT: [id number] (11/20/2010 11:07) Added new observation of FLU VAXMFR: [employer] (11/20/2010 11:07) Added new observation of FLU VAX EXP: 05/18/2011 (11/20/2010 11:07) Added new observation of FLU VAX DSE: 0.63ml (11/20/2010 11:07) Added new observation of FLU VAX: Fluvax 3+ (11/20/2010 11:07)       Orders Added: 1)  Admin 1st Vaccine [90471] 2)  Flu Vaccine 71yrs + [41324]   Review of Systems       Flu Vaccine Consent Questions     Do you have a history of  severe allergic reactions to this vaccine? no    Any prior history of allergic reactions to egg and/or gelatin? no    Do you have a sensitivity to the preservative Thimersol? no    Do you have a past history of Guillan-Barre Syndrome? no    Do you currently have an acute febrile illness? no    Have you ever had a severe reaction to latex? no    Vaccine information given and explained to patient? yes    Are you currently pregnant? no    Lot Number:[id number]   Exp Date:05/18/2011   Site Given  Left Deltoid IM given by C.Wyrick,cma

## 2011-01-13 ENCOUNTER — Other Ambulatory Visit: Payer: Self-pay | Admitting: Internal Medicine

## 2011-02-01 ENCOUNTER — Other Ambulatory Visit: Payer: Self-pay | Admitting: Internal Medicine

## 2011-04-05 NOTE — Op Note (Signed)
[name], Vincent Cantu               ACCOUNT NO.:  [id number]   MEDICAL RECORD NO.:  [id number]          PATIENT TYPE:  AMB   LOCATION:  DAY                          FACILITY:  [facility]   PHYSICIAN:  Thornton Park. Daphine Deutscher, MD  DATE OF BIRTH:  [birth year]   DATE OF PROCEDURE:  01/01/2006  DATE OF DISCHARGE:                                 OPERATIVE REPORT   PREOPERATIVE DIAGNOSIS:  Large umbilical hernia.   POSTOPERATIVE DIAGNOSIS:  Fairly complex umbilical hernia with status post  repair with ventral X-8-cm mesh ring.   SURGEON:  Thornton Park. Daphine Deutscher, MD   ANESTHESIA:  General endotracheal.   DESCRIPTION OF PROCEDURE:  Lawson Mahone was brought to the OR January 01, 2006 and given general anesthesia. The abdomen was prepped widely with  chlorhexidine and draped sterilely. I made a curvilinear incision beneath  his umbilicus and then elevated the umbilical skin off of a very large  complex umbilical hernia. It had a portion going cephalad; and a portion  going to the right, that were separate pockets. I reduced a lot of omental  fat that was up in it; and cleaned it back to where it was one ring. I then  inserted a piece of ventral X-mesh and sutured it with at least seven  horizontal mattress sutures of #0 Prolene through the intercises of the  polypropylene on the inside and this seemed to  deploy nicely into the the  abdomen with the Gore-Tex facing the bowel and the polypropylene side  sutured to the upper fascia. I went around this and sutured it in nicely. It  was well secured. There was no bleeding noted. I then tacked the umbilical  skin down to the fascia and closed the skin with 4-0 Vicryl with Benzoin and  Steri-Strips. The patient seemed to tolerate the procedure well. He was  given an abdominal binder. I also injected the area with Marcaine at the end  of the case. He will be given Percocet to take for pain; and will be  discharged home.      Thornton Park Daphine Deutscher, MD  Electronically Signed     MBM/MEDQ  D:  01/01/2006  T:  01/01/2006  Job:  [id number]   cc:   Stacie Glaze, M.D. [facility]  [address] [place]  [place] [address]

## 2011-04-05 NOTE — Op Note (Signed)
[name], Vincent Cantu               ACCOUNT NO.:  [id number]   MEDICAL RECORD NO.:  [id number]          PATIENT TYPE:  AMB   LOCATION:  SDS                          FACILITY:  [facility]   PHYSICIAN:  Thornton Park. Daphine Deutscher, MD  DATE OF BIRTH:  04/10/1971   DATE OF PROCEDURE:  06/04/2006  DATE OF DISCHARGE:                                 OPERATIVE REPORT   PREOPERATIVE DIAGNOSIS:  Recurrent umbilical hernia with incarceration of  omentum.   POSTOPERATIVE DIAGNOSIS:  Recurrent umbilical hernia with incarceration of  omentum.   PROCEDURE:  Removal of Ventralex mesh patch and repair with Prolite mesh  anteriorly.   SURGEON:  Thornton Park. Daphine Deutscher, MD   ASSISTANT:  None.   ANESTHESIA:  General.   DESCRIPTION OF PROCEDURE:  Mr. Goldman was taken to room 17 at [facility] on  06/04/06 and given general anesthesia.  A curvilinear incision was made  removing his old incision.  I carried this down and then had to dissect free  the umbilical skin from the incarcerated hernia containing fat by CT scan.  I got around this and was able to reduce the fat and then grasp the edges of  fascia and Bovied backward to enter the fascia to get a good rim of fascia  present.  I found the mesh had torn out inferiorly and had rotated upward  allowing this hernia to recur.  With some tedious dissection, I was able to  retrieve the entire specimen and I sent it back to the manufacturer.  When I  had nice free edges present,  I then closed the defect transversely with  interrupted #1 Novofils, first placing interrupted sutures and then  threading them back on to a piece of Prolite mesh and then tying this down  through the mesh so that the mesh actually buttressed the sutures in the  fascial defect.  Intact closure was present.  I infiltrated the area with  0.5% Marcaine and then closed the skin and subcutaneous tissue with 4-0  Vicryl and with Dermabond on the skin.  An abdominal binder will be applied.  The patient  will be taken to recovery room and be given Tylox to take for  pain.  Will be followed up in the office in 3 weeks.      Thornton Park Daphine Deutscher, MD  Electronically Signed     MBM/MEDQ  D:  06/04/2006  T:  06/04/2006  Job:  [id number]

## 2011-05-14 ENCOUNTER — Other Ambulatory Visit: Payer: Self-pay

## 2011-05-21 ENCOUNTER — Encounter: Payer: Self-pay | Admitting: Internal Medicine

## 2011-06-12 ENCOUNTER — Other Ambulatory Visit (INDEPENDENT_AMBULATORY_CARE_PROVIDER_SITE_OTHER): Payer: BC Managed Care – PPO

## 2011-06-12 DIAGNOSIS — Z0000 Encounter for general adult medical examination without abnormal findings: Secondary | ICD-10-CM

## 2011-06-12 LAB — HEPATIC FUNCTION PANEL
AST: 39 U/L — ABNORMAL HIGH (ref 0–37)
Albumin: 4.3 g/dL (ref 3.5–5.2)
Total Bilirubin: 0.8 mg/dL (ref 0.3–1.2)
Total Protein: 8 g/dL (ref 6.0–8.3)

## 2011-06-12 LAB — CBC WITH DIFFERENTIAL/PLATELET
Eosinophils Absolute: 0.1 10*3/uL (ref 0.0–0.7)
Eosinophils Relative: 1.4 % (ref 0.0–5.0)
Lymphocytes Relative: 50 % — ABNORMAL HIGH (ref 12.0–46.0)
Lymphs Abs: 2.8 10*3/uL (ref 0.7–4.0)
MCV: 84.9 fl (ref 78.0–100.0)
Monocytes Absolute: 0.6 10*3/uL (ref 0.1–1.0)
Neutro Abs: 2 10*3/uL (ref 1.4–7.7)
Neutrophils Relative %: 36.9 % — ABNORMAL LOW (ref 43.0–77.0)
Platelets: 215 10*3/uL (ref 150.0–400.0)
RDW: 15.1 % — ABNORMAL HIGH (ref 11.5–14.6)
WBC: 5.6 10*3/uL (ref 4.5–10.5)

## 2011-06-12 LAB — LIPID PANEL
HDL: 33.9 mg/dL — ABNORMAL LOW (ref >39.00)
LDL Cholesterol: 91 mg/dL (ref 0–99)
VLDL: 16.6 mg/dL (ref 0.0–40.0)

## 2011-06-12 LAB — POCT URINALYSIS DIPSTICK
Glucose, UA: NEGATIVE
Ketones, UA: NEGATIVE
Protein, UA: NEGATIVE
Spec Grav, UA: 1.03

## 2011-06-12 LAB — BASIC METABOLIC PANEL
BUN: 12 mg/dL (ref 6–23)
Chloride: 108 mEq/L (ref 96–112)
Creatinine, Ser: 1.4 mg/dL (ref 0.4–1.5)
Potassium: 3.8 mEq/L (ref 3.5–5.1)

## 2011-06-17 ENCOUNTER — Encounter: Payer: Self-pay | Admitting: Internal Medicine

## 2011-06-19 ENCOUNTER — Ambulatory Visit (INDEPENDENT_AMBULATORY_CARE_PROVIDER_SITE_OTHER): Payer: BC Managed Care – PPO | Admitting: Internal Medicine

## 2011-06-19 ENCOUNTER — Encounter: Payer: Self-pay | Admitting: Internal Medicine

## 2011-06-19 VITALS — BP 136/90 | HR 76 | Temp 98.6°F | Resp 16 | Ht 75.0 in | Wt 326.0 lb

## 2011-06-19 DIAGNOSIS — Z0000 Encounter for general adult medical examination without abnormal findings: Secondary | ICD-10-CM

## 2011-06-19 DIAGNOSIS — B351 Tinea unguium: Secondary | ICD-10-CM

## 2011-06-19 DIAGNOSIS — I10 Essential (primary) hypertension: Secondary | ICD-10-CM

## 2011-06-19 DIAGNOSIS — E785 Hyperlipidemia, unspecified: Secondary | ICD-10-CM

## 2011-06-19 DIAGNOSIS — T887XXA Unspecified adverse effect of drug or medicament, initial encounter: Secondary | ICD-10-CM

## 2011-06-19 MED ORDER — TERBINAFINE HCL 250 MG PO TABS: 250.0000 mg | ORAL_TABLET | Freq: Every day | ORAL | Status: DC

## 2011-06-19 NOTE — Progress Notes (Signed)
Addended by: Stacie Glaze MD E on: 06/19/2011 11:07 AM   Modules accepted: Orders

## 2011-06-19 NOTE — Progress Notes (Signed)
  Subjective:    Patient ID: Vincent Cantu, male    DOB: 04-19-1971, 40 y.o.   MRN: [id number]  HPI Patient is a 40 year old African American male who presents for complete physical examination his current active problems include hyperlipidemia which is treated hypertension which is treated keloid on his neck which was removed once and has recurred.  He also has a chief complaint of fungal toenails involving both feet have become discolored and painful. There   Review of Systems  Constitutional: Negative for fever and fatigue.  HENT: Negative for hearing loss, congestion, neck pain and postnasal drip.   Eyes: Negative for discharge, redness and visual disturbance.  Respiratory: Negative for cough, shortness of breath and wheezing.   Cardiovascular: Negative for leg swelling.  Gastrointestinal: Negative for abdominal pain, constipation and abdominal distention.  Genitourinary: Negative for urgency and frequency.  Musculoskeletal: Negative for joint swelling and arthralgias.  Skin: Negative for color change and rash.  Neurological: Negative for weakness and light-headedness.  Hematological: Negative for adenopathy.  Psychiatric/Behavioral: Negative for behavioral problems.   Past Medical History  Diagnosis Date  . Hyperlipidemia   . Hypertension    Past Surgical History  Procedure Date  . Inguinal herniorrhapy   . Lumbar laminectomy   . Lumbar fusion   . Arthroscopic to knee for meniscus tear     reports that he has never smoked. He does not have any smokeless tobacco history on file. He reports that he does not drink alcohol or use illicit drugs. family history includes Diabetes in his other; Hypertension in his other; and Seizures in his other. Allergies not on file     Objective:   Physical Exam  Nursing note and vitals reviewed. Constitutional: He appears well-developed and well-nourished.  HENT:  Head: Normocephalic and atraumatic.  Eyes: Conjunctivae are normal.  Pupils are equal, round, and reactive to light.  Neck: Normal range of motion. Neck supple.  Cardiovascular: Normal rate and regular rhythm.   Pulmonary/Chest: Effort normal and breath sounds normal.  Abdominal: Soft. Bowel sounds are normal.  Genitourinary: Rectum normal and prostate normal.  Skin: Skin is warm and dry.       Normal skin texture keloid on side of right neck toenails with fungal involvement of all 10 toenails both feet  Psychiatric: He has a normal mood and affect. His behavior is normal.          Assessment & Plan:   Patient presents for yearly preventative medicine examination.   all immunizations and health maintenance protocols were reviewed with the patient and they are up to date with these protocols.   screening laboratory values were reviewed with the patient including screening of hyperlipidemia PSA renal function and hepatic function.   There medications past medical history social history problem list and allergies were reviewed in detail.   Goals were established with regard to weight loss exercise diet in compliance with medications  Patient's blood toes well controlled at goal his current medications his blood pressures excellent control.  He is involved in an exercise program which resulted in weight loss.  He has fungal toenails bilaterally which we will treat with Lamisil 250 mg by mouth daily for 90 days we also recommended vinegar foot soaks

## 2011-08-02 ENCOUNTER — Telehealth: Payer: Self-pay | Admitting: Orthopedic Surgery

## 2011-08-02 DIAGNOSIS — I10 Essential (primary) hypertension: Secondary | ICD-10-CM

## 2011-08-02 MED ORDER — AMLODIPINE-OLMESARTAN 10-40 MG PO TABS: 1.0000 | ORAL_TABLET | Freq: Every day | ORAL | Status: DC

## 2011-08-02 NOTE — Telephone Encounter (Signed)
Pt informed

## 2011-08-02 NOTE — Telephone Encounter (Signed)
Pt requesting refillof samples on Azor 10mg . Pt is requesting a 90 day supply. Please contact pt.

## 2011-09-12 ENCOUNTER — Other Ambulatory Visit (INDEPENDENT_AMBULATORY_CARE_PROVIDER_SITE_OTHER): Payer: BC Managed Care – PPO

## 2011-09-12 DIAGNOSIS — T887XXA Unspecified adverse effect of drug or medicament, initial encounter: Secondary | ICD-10-CM

## 2011-09-12 LAB — HEPATIC FUNCTION PANEL
ALT: 35 U/L (ref 0–53)
AST: 31 U/L (ref 0–37)
Albumin: 4.1 g/dL (ref 3.5–5.2)
Alkaline Phosphatase: 92 U/L (ref 39–117)

## 2011-09-19 ENCOUNTER — Ambulatory Visit (INDEPENDENT_AMBULATORY_CARE_PROVIDER_SITE_OTHER): Payer: BC Managed Care – PPO | Admitting: Internal Medicine

## 2011-09-19 ENCOUNTER — Encounter: Payer: Self-pay | Admitting: Internal Medicine

## 2011-09-19 VITALS — BP 122/80 | HR 80 | Temp 98.2°F | Resp 16 | Ht 75.0 in | Wt 330.0 lb

## 2011-09-19 DIAGNOSIS — Z23 Encounter for immunization: Secondary | ICD-10-CM

## 2011-09-19 NOTE — Progress Notes (Signed)
  Subjective:    Patient ID: Vincent Cantu, male    DOB: 1971-10-20, 40 y.o.   MRN: [id number]  HPI any and a recent history of elevated lower functions.  We discussed fatty infiltration of the liver and the need to modify fats and lose weight he was able to modify his intake but he did not lose weight.  His blood pressure stable his current medications he has no chest pain shortness of breath PND orthopnea he has no sequelae from the liver disease and his liver functions were normal a recheck of his liver functions.      Review of Systems  Constitutional: Negative for fever and fatigue.  HENT: Negative for hearing loss, congestion, neck pain and postnasal drip.   Eyes: Negative for discharge, redness and visual disturbance.  Respiratory: Negative for cough, shortness of breath and wheezing.   Cardiovascular: Negative for leg swelling.  Gastrointestinal: Negative for abdominal pain, constipation and abdominal distention.  Genitourinary: Negative for urgency and frequency.  Musculoskeletal: Negative for joint swelling and arthralgias.  Skin: Negative for color change and rash.  Neurological: Negative for weakness and light-headedness.  Hematological: Negative for adenopathy.  Psychiatric/Behavioral: Negative for behavioral problems.   Past Medical History  Diagnosis Date  . Hyperlipidemia   . Hypertension    Past Surgical History  Procedure Date  . Inguinal herniorrhapy   . Lumbar laminectomy   . Lumbar fusion   . Arthroscopic to knee for meniscus tear     reports that he has never smoked. He does not have any smokeless tobacco history on file. He reports that he does not drink alcohol or use illicit drugs. family history includes Diabetes in his other; Hypertension in his other; and Seizures in his other. No Known Allergies      Objective:   Physical Exam  Nursing note and vitals reviewed. Constitutional: He appears well-developed and well-nourished.  HENT:  Head:  Normocephalic and atraumatic.  Eyes: Conjunctivae are normal. Pupils are equal, round, and reactive to light.  Neck: Normal range of motion. Neck supple.  Cardiovascular: Normal rate and regular rhythm.   Pulmonary/Chest: Effort normal and breath sounds normal.  Abdominal: Soft. Bowel sounds are normal.          Assessment & Plan:  Follow up of blood pressure currently very stable on the combination of amlodipine Benicar and HCTZ. Side effects of medications.  Discussed diet and the use of liquid meal replacement for one of his meals to help lose weight he'll return in 4 months for monitoring of his weight.

## 2011-09-19 NOTE — Patient Instructions (Signed)
Myoplex shake for lunch

## 2011-10-30 ENCOUNTER — Other Ambulatory Visit: Payer: Self-pay | Admitting: *Deleted

## 2011-10-30 ENCOUNTER — Telehealth: Payer: Self-pay

## 2011-10-30 DIAGNOSIS — I10 Essential (primary) hypertension: Secondary | ICD-10-CM

## 2011-10-30 MED ORDER — METOPROLOL SUCCINATE ER 100 MG PO TB24: 100.0000 mg | ORAL_TABLET | Freq: Every day | ORAL | Status: DC

## 2011-10-30 MED ORDER — AMLODIPINE-OLMESARTAN 10-40 MG PO TABS: 1.0000 | ORAL_TABLET | Freq: Every day | ORAL | Status: DC

## 2011-10-30 MED ORDER — ATORVASTATIN CALCIUM 20 MG PO TABS: 20.0000 mg | ORAL_TABLET | Freq: Every day | ORAL | Status: DC

## 2011-10-30 NOTE — Telephone Encounter (Signed)
Done an d Left message on machine For pt that it has been done

## 2011-10-30 NOTE — Telephone Encounter (Signed)
Pt states he needs a refill of 5 pills for Lipitor and metoprolol.  Pls send to pharmacy.

## 2012-01-20 ENCOUNTER — Ambulatory Visit (INDEPENDENT_AMBULATORY_CARE_PROVIDER_SITE_OTHER): Payer: BC Managed Care – PPO | Admitting: Internal Medicine

## 2012-01-20 ENCOUNTER — Encounter: Payer: Self-pay | Admitting: Internal Medicine

## 2012-01-20 DIAGNOSIS — E785 Hyperlipidemia, unspecified: Secondary | ICD-10-CM

## 2012-01-20 DIAGNOSIS — T887XXA Unspecified adverse effect of drug or medicament, initial encounter: Secondary | ICD-10-CM

## 2012-01-20 LAB — LDL CHOLESTEROL, DIRECT: Direct LDL: 103.5 mg/dL

## 2012-01-20 LAB — BASIC METABOLIC PANEL
CO2: 25 mEq/L (ref 19–32)
Calcium: 9.3 mg/dL (ref 8.4–10.5)
Creatinine, Ser: 1.5 mg/dL (ref 0.4–1.5)
Glucose, Bld: 110 mg/dL — ABNORMAL HIGH (ref 70–99)
Potassium: 3.6 mEq/L (ref 3.5–5.1)

## 2012-01-20 LAB — LIPID PANEL: VLDL: 19.6 mg/dL (ref 0.0–40.0)

## 2012-01-20 NOTE — Progress Notes (Signed)
  Subjective:    Patient ID: Vincent Cantu, male    DOB: 27-May-1971, 41 y.o.   MRN: [id number]  HPI  [place] weight loss and hypertension  Review of Systems  Constitutional: Negative for fever and fatigue.  HENT: Negative for hearing loss, congestion, neck pain and postnasal drip.   Eyes: Negative for discharge, redness and visual disturbance.  Respiratory: Negative for cough, shortness of breath and wheezing.   Cardiovascular: Negative for leg swelling.  Gastrointestinal: Negative for abdominal pain, constipation and abdominal distention.  Genitourinary: Negative for urgency and frequency.  Musculoskeletal: Negative for joint swelling and arthralgias.  Skin: Negative for color change and rash.  Neurological: Negative for weakness and light-headedness.  Hematological: Negative for adenopathy.  Psychiatric/Behavioral: Negative for behavioral problems.       Objective:   Physical Exam  Nursing note reviewed. Constitutional: He appears well-developed and well-nourished.  HENT:  Head: Normocephalic and atraumatic.  Eyes: Conjunctivae are normal. Pupils are equal, round, and reactive to light.  Neck: Normal range of motion. Neck supple.  Cardiovascular: Normal rate and regular rhythm.   Pulmonary/Chest: Effort normal and breath sounds normal.  Abdominal: Soft. Bowel sounds are normal.          Assessment & Plan:  Patient presents for followup of weight hyperlipidemia and hypertension.  His blood pressure is well-controlled with current medications he has a history of hypokalemia which appears to have resolved he has continued to gain weight and has not lost weight as we had set up as a goal for him but his blood pressure is within normal limits.   We will see him back in 3 months with a goal of 10-20 pounds of weight loss continue his medication and we'll schedule him for his physical

## 2012-01-20 NOTE — Patient Instructions (Signed)
One cup of white vinegar in i gallon of warm water and soak feet for 15 min three times a week

## 2012-04-23 ENCOUNTER — Other Ambulatory Visit: Payer: Self-pay | Admitting: Internal Medicine

## 2012-05-05 ENCOUNTER — Telehealth: Payer: Self-pay | Admitting: Internal Medicine

## 2012-05-05 DIAGNOSIS — I10 Essential (primary) hypertension: Secondary | ICD-10-CM

## 2012-05-05 MED ORDER — ATORVASTATIN CALCIUM 20 MG PO TABS: 20.0000 mg | ORAL_TABLET | Freq: Every day | ORAL | Status: DC

## 2012-05-05 MED ORDER — AMLODIPINE-OLMESARTAN 10-40 MG PO TABS: 1.0000 | ORAL_TABLET | Freq: Every day | ORAL | Status: DC

## 2012-05-05 MED ORDER — METOPROLOL SUCCINATE ER 100 MG PO TB24: 100.0000 mg | ORAL_TABLET | Freq: Every day | ORAL | Status: DC

## 2012-05-05 MED ORDER — HYDROCHLOROTHIAZIDE 25 MG PO TABS: 25.0000 mg | ORAL_TABLET | Freq: Every day | ORAL | Status: DC

## 2012-05-05 MED ORDER — FOLIC ACID-VIT B6-VIT B12 2.5-25-1 MG PO TABS: 1.0000 | ORAL_TABLET | Freq: Every day | ORAL | Status: DC

## 2012-05-05 NOTE — Telephone Encounter (Signed)
Pt is in [place]. His car has broken down and they are stranded there. He is calling MD to see if he can get several days of his meds called in (he told nurse he didn't know them all-- we should know that); Toprol; HCTZ, etc. Please call them into [facility]/ [address] [place] [address] [place] [address]. Pt uses mail order usually

## 2012-05-06 ENCOUNTER — Telehealth: Payer: Self-pay | Admitting: Family Medicine

## 2012-05-06 NOTE — Telephone Encounter (Signed)
confidential Office Message [address] [place], [place] [address] p. [phone] f. [phone] To: Lacey Jensen (Daytime Triage) Fax: [phone] From: [facility] Date/ Time: 05/05/2012 4:28 PM Taken By: Lesli Albee, RN Caller: Earvin Sohum [place] Facility: not collected Patient: Vincent Cantu, Vincent Cantu DOB: October 03, 1971 Phone: [phone] Reason for Call: Pts car broke down in [place]. He is stuck there for several days. Pt is calling to say he [facility] [address] [place] [address] [place] [address]. Pt doesn't know all the meds he takes but "Toprol and HCTZ, etc". 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 Regarding Appointment: Appt Date: Appt Time: Unknown

## 2012-06-05 ENCOUNTER — Other Ambulatory Visit: Payer: Self-pay | Admitting: *Deleted

## 2012-06-08 ENCOUNTER — Other Ambulatory Visit (INDEPENDENT_AMBULATORY_CARE_PROVIDER_SITE_OTHER): Payer: BC Managed Care – PPO

## 2012-06-08 DIAGNOSIS — Z0000 Encounter for general adult medical examination without abnormal findings: Secondary | ICD-10-CM

## 2012-06-08 LAB — POCT URINALYSIS DIPSTICK
Bilirubin, UA: NEGATIVE
Blood, UA: NEGATIVE
Glucose, UA: NEGATIVE
Nitrite, UA: NEGATIVE
Spec Grav, UA: >=1.03
Urobilinogen, UA: 1

## 2012-06-08 LAB — LIPID PANEL
Cholesterol: 142 mg/dL (ref 0–200)
LDL Cholesterol: 96 mg/dL (ref 0–99)
Triglycerides: 76 mg/dL (ref 0.0–149.0)

## 2012-06-08 LAB — HEPATIC FUNCTION PANEL
ALT: 56 U/L — ABNORMAL HIGH (ref 0–53)
AST: 44 U/L — ABNORMAL HIGH (ref 0–37)
Albumin: 4.1 g/dL (ref 3.5–5.2)
Alkaline Phosphatase: 83 U/L (ref 39–117)

## 2012-06-08 LAB — BASIC METABOLIC PANEL
GFR: 61.75 mL/min (ref >60.00)
Potassium: 3.8 mEq/L (ref 3.5–5.1)
Sodium: 140 mEq/L (ref 135–145)

## 2012-06-08 LAB — CBC WITH DIFFERENTIAL/PLATELET
Eosinophils Relative: 1.4 % (ref 0.0–5.0)
HCT: 39.3 % (ref 39.0–52.0)
Hemoglobin: 12.6 g/dL — ABNORMAL LOW (ref 13.0–17.0)
Lymphs Abs: 3.4 10*3/uL (ref 0.7–4.0)
Monocytes Relative: 11.1 % (ref 3.0–12.0)
Neutro Abs: 2.6 10*3/uL (ref 1.4–7.7)
RBC: 4.58 Mil/uL (ref 4.22–5.81)
WBC: 7 10*3/uL (ref 4.5–10.5)

## 2012-06-08 LAB — TSH: TSH: 2.17 u[IU]/mL (ref 0.35–5.50)

## 2012-06-08 LAB — PSA: PSA: 1.13 ng/mL (ref 0.10–4.00)

## 2012-06-15 ENCOUNTER — Ambulatory Visit (INDEPENDENT_AMBULATORY_CARE_PROVIDER_SITE_OTHER): Payer: BC Managed Care – PPO | Admitting: Internal Medicine

## 2012-06-15 ENCOUNTER — Encounter: Payer: Self-pay | Admitting: Internal Medicine

## 2012-06-15 VITALS — BP 140/82 | HR 72 | Temp 98.2°F | Resp 16 | Ht 75.0 in | Wt 342.0 lb

## 2012-06-15 DIAGNOSIS — E785 Hyperlipidemia, unspecified: Secondary | ICD-10-CM

## 2012-06-15 DIAGNOSIS — I10 Essential (primary) hypertension: Secondary | ICD-10-CM

## 2012-06-15 DIAGNOSIS — Z0000 Encounter for general adult medical examination without abnormal findings: Secondary | ICD-10-CM

## 2012-06-15 NOTE — Progress Notes (Signed)
Subjective:    Patient ID: Vincent Cantu, male    DOB: November 01, 1971, 41 y.o.   MRN: [id number]  HPI The patient  Admits to having increased sweets the weekend prior to labs as an explanation for elevated liver enzymes   Review of Systems  Constitutional: Negative for fever and fatigue.  HENT: Negative for hearing loss, congestion, neck pain and postnasal drip.   Eyes: Negative for discharge, redness and visual disturbance.  Respiratory: Negative for cough, shortness of breath and wheezing.   Cardiovascular: Negative for leg swelling.  Gastrointestinal: Negative for abdominal pain, constipation and abdominal distention.  Genitourinary: Negative for urgency and frequency.  Musculoskeletal: Negative for joint swelling and arthralgias.  Skin: Negative for color change and rash.  Neurological: Negative for weakness and light-headedness.  Hematological: Negative for adenopathy.  Psychiatric/Behavioral: Negative for behavioral problems.       Past Medical History  Diagnosis Date  . Hyperlipidemia   . Hypertension     History   Social History  . Marital Status: Married    Spouse Name: N/A    Number of Children: N/A  . Years of Education: N/A   Occupational History  . Not on file.   Social History Main Topics  . Smoking status: Never Smoker   . Smokeless tobacco: Not on file  . Alcohol Use: No  . Drug Use: No  . Sexually Active: Not on file   Other Topics Concern  . Not on file   Social History Narrative   Time Berlinda Last Single    Past Surgical History  Procedure Date  . Inguinal herniorrhapy   . Lumbar laminectomy   . Lumbar fusion   . Arthroscopic to knee for meniscus tear     Family History  Problem Relation Age of Onset  . Hypertension Other   . Seizures Other   . Diabetes Other     with renal dz    No Known Allergies  Current Outpatient Prescriptions on File Prior to Visit  Medication Sig Dispense Refill  . amLODipine-olmesartan (AZOR) 10-40  MG per tablet Take 1 tablet by mouth daily.  10 tablet  3  . atorvastatin (LIPITOR) 20 MG tablet Take 1 tablet (20 mg total) by mouth daily.  10 tablet  3  . Folic Acid-Vit B6-Vit B12 (FOLBEE) 2.5-25-1 MG TABS Take 1 tablet by mouth daily.  10 tablet  3  . hydrochlorothiazide (HYDRODIURIL) 25 MG tablet Take 1 tablet (25 mg total) by mouth daily.  10 tablet  3  . metoprolol succinate (TOPROL-XL) 100 MG 24 hr tablet Take 1 tablet (100 mg total) by mouth daily. Take with or immediately following a meal.  10 tablet  3    BP 140/82  Pulse 72  Temp 98.2 F (36.8 C)  Resp 16  Ht 6\' 3"  (1.905 m)  Wt 342 lb (155.13 kg)  BMI 42.75 kg/m2    Objective:   Physical Exam  Nursing note and vitals reviewed. Constitutional: He is oriented to person, place, and time. He appears well-developed and well-nourished.  HENT:  Head: Normocephalic and atraumatic.  Eyes: Conjunctivae are normal. Pupils are equal, round, and reactive to light.  Neck: Normal range of motion. Neck supple.  Cardiovascular: Normal rate and regular rhythm.   Pulmonary/Chest: Effort normal and breath sounds normal.  Abdominal: Soft. Bowel sounds are normal.  Genitourinary: Rectum normal and prostate normal.  Musculoskeletal: Normal range of motion.  Neurological: He is alert and oriented to person, place, and time.  Skin: Skin is warm and dry.  Psychiatric: He has a normal mood and affect. His behavior is normal.          Assessment & Plan:   Patient presents for yearly preventative medicine examination.   all immunizations and health maintenance protocols were reviewed with the patient and they are up to date with these protocols.   screening laboratory values were reviewed with the patient including screening of hyperlipidemia PSA renal function and hepatic function.   There medications past medical history social history problem list and allergies were reviewed in detail.   Goals were established with regard to  weight loss exercise diet in compliance with medications   Elevated liver functions Mild anemia Repeat liver today If still high stop lipitor, order [place] of liver resumed

## 2012-06-15 NOTE — Patient Instructions (Signed)
The patient is instructed to continue all medications as prescribed. Schedule followup with check out clerk upon leaving the clinic reviewed "paleo: diet"

## 2012-08-24 ENCOUNTER — Telehealth: Payer: Self-pay | Admitting: Internal Medicine

## 2012-08-24 DIAGNOSIS — I10 Essential (primary) hypertension: Secondary | ICD-10-CM

## 2012-08-24 MED ORDER — AMLODIPINE-OLMESARTAN 10-40 MG PO TABS: 1.0000 | ORAL_TABLET | Freq: Every day | ORAL | Status: DC

## 2012-08-24 NOTE — Telephone Encounter (Signed)
No samples available- pt informed and sent to [facility]

## 2012-08-24 NOTE — Telephone Encounter (Signed)
Patient called stating that he would like to have samples of Azor 10mg . Please assist.

## 2012-09-01 ENCOUNTER — Ambulatory Visit (INDEPENDENT_AMBULATORY_CARE_PROVIDER_SITE_OTHER): Payer: BC Managed Care – PPO | Admitting: Family Medicine

## 2012-09-01 ENCOUNTER — Encounter: Payer: Self-pay | Admitting: Family Medicine

## 2012-09-01 VITALS — BP 142/100 | HR 99 | Temp 98.4°F | Wt 344.0 lb

## 2012-09-01 DIAGNOSIS — J309 Allergic rhinitis, unspecified: Secondary | ICD-10-CM

## 2012-09-01 DIAGNOSIS — Z2089 Contact with and (suspected) exposure to other communicable diseases: Secondary | ICD-10-CM

## 2012-09-01 DIAGNOSIS — Z20818 Contact with and (suspected) exposure to other bacterial communicable diseases: Secondary | ICD-10-CM

## 2012-09-01 DIAGNOSIS — J069 Acute upper respiratory infection, unspecified: Secondary | ICD-10-CM

## 2012-09-01 LAB — POCT RAPID STREP A (OFFICE): Rapid Strep A Screen: NEGATIVE

## 2012-09-01 MED ORDER — FLUTICASONE PROPIONATE 50 MCG/ACT NA SUSP: 2.0000 | Freq: Every day | NASAL | Status: DC

## 2012-09-01 NOTE — Progress Notes (Signed)
Chief Complaint  Patient presents with  . Sinusitis    HPI:  Started 3 days ago: Symptoms: sinus pressure in nose, L frontal pain, nasal congestion, pressure around L eye Denies: SOB, cough, tooth pain, sore throat, NVD Has history of rare headaches Seems to have improved some with tylenol sinus Youngest son is sick with strep  ROS: See pertinent positives and negatives per HPI.  Past Medical History  Diagnosis Date  . Hyperlipidemia   . Hypertension     Family History  Problem Relation Age of Onset  . Hypertension Other   . Seizures Other   . Diabetes Other     with renal dz    History   Social History  . Marital Status: Married    Spouse Name: N/A    Number of Children: N/A  . Years of Education: N/A   Social History Main Topics  . Smoking status: Never Smoker   . Smokeless tobacco: None  . Alcohol Use: No  . Drug Use: No  . Sexually Active: None   Other Topics Concern  . None   Social History Narrative   Time Physiological scientist Single    Current outpatient prescriptions:amLODipine-olmesartan (AZOR) 10-40 MG per tablet, Take 1 tablet by mouth daily., Disp: 90 tablet, Rfl: 3;  atorvastatin (LIPITOR) 20 MG tablet, Take 1 tablet (20 mg total) by mouth daily., Disp: 10 tablet, Rfl: 3;  Folic Acid-Vit B6-Vit B12 (FOLBEE) 2.5-25-1 MG TABS, Take 1 tablet by mouth daily., Disp: 10 tablet, Rfl: 3 hydrochlorothiazide (HYDRODIURIL) 25 MG tablet, Take 1 tablet (25 mg total) by mouth daily., Disp: 10 tablet, Rfl: 3;  metoprolol succinate (TOPROL-XL) 100 MG 24 hr tablet, Take 1 tablet (100 mg total) by mouth daily. Take with or immediately following a meal., Disp: 10 tablet, Rfl: 3;  fluticasone (FLONASE) 50 MCG/ACT nasal spray, Place 2 sprays into the nose daily., Disp: 16 g, Rfl: 6  EXAM:  Filed Vitals:   09/01/12 1416  BP: 142/100  Pulse: 99  Temp: 98.4 F (36.9 C)    There is no height on file to calculate BMI.  GENERAL: vitals reviewed and listed above, alert,  oriented, appears well hydrated and in no acute distress  HEENT: atraumatic, conjunttiva clear, no obvious abnormalities on inspection of external nose and ears, ear canals clear and TMs normal, clear rhinorrhea with large boggy turbinates, PND with enlarged exudative tonsil. TTP over maxillary sinuses and above L eyebrow. No Temp art TTP.  NECK: no obvious masses on inspection, No LAD  LUNGS: clear to auscultation bilaterally, no wheezes, rales or rhonchi, good air movement  CV: HRRR, no peripheral edema  MS: moves all extremities without noticeable abnormality  PSYCH: pleasant and cooperative, no obvious depression or anxiety  ASSESSMENT AND PLAN:  Discussed the following assessment and plan:  1. Upper respiratory infection  POC Rapid Strep A  2. Allergic rhinitis  fluticasone (FLONASE) 50 MCG/ACT nasal spray  3. Strep throat exposure  POC Rapid Strep A   -on exam tonsils enlarged and exudative, also with impressive turbinate enlargement and bogginess. Will screen for strep given recent exposure and tx if Positive. Otherwise with tx as rhinosinusitis per orders and instructions below with return precautions. -Patient advised to return or notify a doctor immediately if symptoms worsen or persist or new concerns arise.  There are no Patient Instructions on file for this visit.   Kriste Basque R.

## 2012-09-01 NOTE — Patient Instructions (Addendum)
NSTRUCTIONS FOR UPPER RESPIRATORY INFECTION:  -plenty of rest and fluids  -nasal saline wash 2-3 times daily (use prepackaged nasal saline or bottled/distilled water if making your own)   -clean nose with nasal saline before using the nasal steroid   -can use tylenol or ibuprofen as directed for aches   -in the winter time, using a humidifier at night is helpful (please follow cleaning instructions)  -if you are taking a cough medication - use only as directed, may also try a teaspoon of honey to coat the throat and throat lozenges  -follow up if you have fevers, are worsening or not getting better over next several days

## 2012-09-17 ENCOUNTER — Ambulatory Visit: Payer: BC Managed Care – PPO | Admitting: Internal Medicine

## 2012-10-26 ENCOUNTER — Ambulatory Visit: Payer: BC Managed Care – PPO | Admitting: Internal Medicine

## 2012-11-02 ENCOUNTER — Ambulatory Visit: Payer: BC Managed Care – PPO | Admitting: Internal Medicine

## 2012-11-19 ENCOUNTER — Ambulatory Visit: Payer: BC Managed Care – PPO | Admitting: Internal Medicine

## 2013-04-29 ENCOUNTER — Telehealth: Payer: Self-pay | Admitting: Internal Medicine

## 2013-04-29 DIAGNOSIS — I10 Essential (primary) hypertension: Secondary | ICD-10-CM

## 2013-04-29 MED ORDER — FOLIC ACID-VIT B6-VIT B12 2.5-25-1 MG PO TABS: 1.0000 | ORAL_TABLET | Freq: Every day | ORAL | Status: DC

## 2013-04-29 MED ORDER — ATORVASTATIN CALCIUM 20 MG PO TABS: 20.0000 mg | ORAL_TABLET | Freq: Every day | ORAL | Status: DC

## 2013-04-29 MED ORDER — METOPROLOL SUCCINATE ER 100 MG PO TB24: 100.0000 mg | ORAL_TABLET | Freq: Every day | ORAL | Status: DC

## 2013-04-29 MED ORDER — HYDROCHLOROTHIAZIDE 25 MG PO TABS: 25.0000 mg | ORAL_TABLET | Freq: Every day | ORAL | Status: DC

## 2013-04-29 MED ORDER — AMLODIPINE-OLMESARTAN 10-40 MG PO TABS: 1.0000 | ORAL_TABLET | Freq: Every day | ORAL | Status: DC

## 2013-04-29 NOTE — Telephone Encounter (Signed)
Pt has switched pharmacies due to work change. Now using [facility]. However, he is OUT of his meds. Pt requesting we send a 30 day supply of [facility] to [facility]. Then send 90-day supply for all of them to the [facility] to start after the 30 days. Please send his rx for:  amLODipine-olmesartan (AZOR) 10-40 MG per tablet atorvastatin (LIPITOR) 20 MG tablet Folic Acid-Vit B6-Vit B12 (FOLBEE) 2.5-25-1 MG TABS hydrochlorothiazide (HYDRODIURIL) 25 MG tablet metoprolol succinate (TOPROL-XL) 100 MG 24 hr tablet

## 2013-04-29 NOTE — Telephone Encounter (Signed)
This was done.

## 2013-05-03 ENCOUNTER — Telehealth: Payer: Self-pay | Admitting: Internal Medicine

## 2013-05-03 NOTE — Telephone Encounter (Signed)
Caller left a message with questions regarding medications. RN attempted to reach patient. RN left a voicemail for patient to return our call.

## 2013-05-06 NOTE — Telephone Encounter (Signed)
Pt is on 3 different BP meds. Pt states one is particulaly expensive. Not sure which one, but would like to know if he could possibly go to something less expensive, all generic, or go off some of these. Pt past due for CPX. Made appt first available 10/18/13.  Pls advise

## 2013-05-06 NOTE — Telephone Encounter (Signed)
Pt states it is azor he would like changed-will be Friday before call is returned

## 2013-05-07 ENCOUNTER — Other Ambulatory Visit: Payer: Self-pay | Admitting: *Deleted

## 2013-05-07 MED ORDER — OLMESARTAN MEDOXOMIL 40 MG PO TABS: 40.0000 mg | ORAL_TABLET | Freq: Every day | ORAL | Status: DC

## 2013-05-07 MED ORDER — AMLODIPINE BESYLATE 10 MG PO TABS: 10.0000 mg | ORAL_TABLET | Freq: Every day | ORAL | Status: DC

## 2013-05-07 NOTE — Telephone Encounter (Signed)
Per dr Lovell Sheehan- may break up into norvasc and olmesartan-Left message on machine For pt and sent to [facility]

## 2013-05-27 ENCOUNTER — Telehealth: Payer: Self-pay | Admitting: Family Medicine

## 2013-05-27 NOTE — Telephone Encounter (Signed)
TSD  

## 2013-06-22 ENCOUNTER — Encounter: Payer: Self-pay | Admitting: Family Medicine

## 2013-06-22 ENCOUNTER — Ambulatory Visit (INDEPENDENT_AMBULATORY_CARE_PROVIDER_SITE_OTHER): Payer: Managed Care, Other (non HMO) | Admitting: Family Medicine

## 2013-06-22 VITALS — BP 140/100 | HR 80 | Ht 75.0 in | Wt 342.0 lb

## 2013-06-22 DIAGNOSIS — Z0000 Encounter for general adult medical examination without abnormal findings: Secondary | ICD-10-CM

## 2013-06-22 DIAGNOSIS — J309 Allergic rhinitis, unspecified: Secondary | ICD-10-CM

## 2013-06-22 DIAGNOSIS — R195 Other fecal abnormalities: Secondary | ICD-10-CM

## 2013-06-22 DIAGNOSIS — I10 Essential (primary) hypertension: Secondary | ICD-10-CM

## 2013-06-22 DIAGNOSIS — E785 Hyperlipidemia, unspecified: Secondary | ICD-10-CM

## 2013-06-22 DIAGNOSIS — K439 Ventral hernia without obstruction or gangrene: Secondary | ICD-10-CM

## 2013-06-22 HISTORY — DX: Allergic rhinitis, unspecified: J30.9

## 2013-06-22 HISTORY — DX: Morbid (severe) obesity due to excess calories: E66.01

## 2013-06-22 LAB — CBC WITH DIFFERENTIAL/PLATELET
Eosinophils Absolute: 0.1 10*3/uL (ref 0.0–0.7)
Eosinophils Relative: 1 % (ref 0–5)
HCT: 38.1 % — ABNORMAL LOW (ref 39.0–52.0)
Lymphocytes Relative: 46 % (ref 12–46)
Lymphs Abs: 3.4 10*3/uL (ref 0.7–4.0)
MCH: 28 pg (ref 26.0–34.0)
MCV: 80.9 fL (ref 78.0–100.0)
Monocytes Absolute: 0.8 10*3/uL (ref 0.1–1.0)
Monocytes Relative: 11 % (ref 3–12)
Platelets: 232 10*3/uL (ref 150–400)
RBC: 4.71 MIL/uL (ref 4.22–5.81)
WBC: 7.3 10*3/uL (ref 4.0–10.5)

## 2013-06-22 LAB — POCT URINALYSIS DIPSTICK
Bilirubin, UA: NEGATIVE
Blood, UA: NEGATIVE
Ketones, UA: NEGATIVE
Protein, UA: NEGATIVE
Spec Grav, UA: 1.02
pH, UA: 5

## 2013-06-22 LAB — HEMOCCULT GUIAC POC 1CARD (OFFICE)

## 2013-06-22 NOTE — Patient Instructions (Signed)
Take a good multivitamin. Bring in your list of blood pressures medicines that the insurance will cover him up which on some based on that and keep the cost down

## 2013-06-22 NOTE — Progress Notes (Signed)
Pt is scheduled with Dr. Elnoria Howard Monday August 11th @ 3:00pm for his + stool. Faxed over notes to them @ [phone]

## 2013-06-22 NOTE — Progress Notes (Signed)
Subjective:    Patient ID: Vincent Cantu, male    DOB: Dec 25, 1970, 42 y.o.   MRN: [id number]  HPI He is here as a new patient for complete examination. He is switching to my care from Dr. Lovell Sheehan. He has an underlying history of hypertension however his medications are quite extensive. He has run out of one of them recently. He has hyperlipidemia. He and his wife are working on making dietary as well as exercise changes. He has lost 5 pounds within the last month or so with making these changes. He has a previous history of ventral hernia repair however has noted a recurrence of this in the last several months. He has no other concerns or complaints.   Review of Systems  HENT: Positive for congestion, rhinorrhea, sneezing and postnasal drip.   Eyes: Negative.   Respiratory: Negative.   Cardiovascular: Negative.   Gastrointestinal: Negative.   Endocrine: Negative.   Genitourinary: Negative.   Musculoskeletal: Negative.   Allergic/Immunologic: Negative.   Neurological: Negative.   Hematological: Negative.   Psychiatric/ Behavioral: Negative.        Objective:   Physical Exam BP 140/100  Pulse 80  Ht 6\' 3"  (1.905 m)  Wt 342 lb (155.13 kg)  BMI 42.75 kg/m2  General Appearance:    Alert, cooperative, no distress, appears stated age  Head:    Normocephalic, without obvious abnormality, atraumatic  Eyes:    PERRL, conjunctiva/corneas clear, EOM's intact, fundi    benign  Ears:    Normal TM's and external ear canals  Nose:   Nares normal, mucosa normal, no drainage or sinus   tenderness  Throat:   Lips, mucosa, and tongue normal; teeth and gums normal  Neck:   Supple, no lymphadenopathy;  thyroid:  no   enlargement/tenderness/nodules; no carotid   bruit or JVD  Back:    Spine nontender, no curvature, ROM normal, no CVA     tenderness  Lungs:     Clear to auscultation bilaterally without wheezes, rales or     ronchi; respirations unlabored  Chest Wall:    No tenderness or  deformity   Heart:    Regular rate and rhythm, S1 and S2 normal, no murmur, rub   or gallop  Breast Exam:    No chest wall tenderness, masses or gynecomastia  Abdomen:     Soft, non-tender, nondistended, normoactive bowel sounds,    no masses, no hepatosplenomegaly  Genitalia:    Normal male external genitalia without lesions.  Testicles without masses.  No inguinal hernias.  Rectal:    Normal sphincter tone, no masses or tenderness; guaiac positive stool.  Prostate smooth, no nodules, not enlarged.  Extremities:   No clubbing, cyanosis or edema  Pulses:   2+ and symmetric all extremities  Skin:   Skin color, texture, turgor normal, no rashes or lesions  Lymph nodes:   Cervical, supraclavicular, and axillary nodes normal  Neurologic:   CNII-XII intact, normal strength, sensation and gait; reflexes 2+ and symmetric throughout          Psych:   Normal mood, affect, hygiene and grooming.          Assessment & Plan:  Routine general medical examination at a health care facility - Plan: CBC with Differential, Comprehensive metabolic panel, Lipid panel, Hemoccult - 1 Card (office)  HYPERLIPIDEMIA  HYPERTENSION  Morbid obesity - Plan: Amb ref to Medical Nutrition Therapy-MNT  Ventral hernia  Stool guaiac positive - Plan: HM COLONOSCOPY  Allergic  rhinitis  he is to get a copy of the insurance company's  hypertension medications and I would just his medications accordingly. Discussed diet and exercise with him extensively. Discussed low carbohydrate diet specifically the [employer] or bacteremia diet. I will refer to nutritionist for help with this. We will readdress of ventral hernia at some point in the future.

## 2013-06-22 NOTE — Addendum Note (Signed)
Addended by: Barbette Or A on: 06/22/2013 04:48 PM   Modules accepted: Orders

## 2013-06-23 LAB — LIPID PANEL
LDL Cholesterol: 150 mg/dL — ABNORMAL HIGH (ref 0–99)
Triglycerides: 110 mg/dL (ref <150)
VLDL: 22 mg/dL (ref 0–40)

## 2013-06-23 LAB — COMPREHENSIVE METABOLIC PANEL
ALT: 65 U/L — ABNORMAL HIGH (ref 0–53)
BUN: 13 mg/dL (ref 6–23)
CO2: 24 mEq/L (ref 19–32)
Calcium: 9.5 mg/dL (ref 8.4–10.5)
Chloride: 103 mEq/L (ref 96–112)
Creat: 1.4 mg/dL — ABNORMAL HIGH (ref 0.50–1.35)
Glucose, Bld: 98 mg/dL (ref 70–99)
Total Bilirubin: 0.9 mg/dL (ref 0.3–1.2)

## 2013-07-02 ENCOUNTER — Ambulatory Visit: Payer: Managed Care, Other (non HMO) | Admitting: Medical

## 2013-07-14 ENCOUNTER — Encounter: Payer: Managed Care, Other (non HMO) | Attending: Family Medicine | Admitting: Dietician

## 2013-07-14 ENCOUNTER — Encounter: Payer: Self-pay | Admitting: Dietician

## 2013-07-14 DIAGNOSIS — Z713 Dietary counseling and surveillance: Secondary | ICD-10-CM | POA: Insufficient documentation

## 2013-07-14 NOTE — Progress Notes (Signed)
  Medical Nutrition Therapy:  Appt start time: 1630 end time:  1730.   Assessment:  Primary concerns today: Vincent Cantu states that he would like to lose weight. Reports being "rail thin" in high school and was about 260 pounds 12 years ago. Weight gain has been gradual. Has started making some changes such as cutting out fast food and and started exercising and lost 5-10 pounds so far. Weight loss goal is 260 lbs.   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 States that he primarily does the grocery shopping and shares the cooking duties with his wife. Says that he doesn't feel hungry ever.   MEDICATIONS: see list   DIETARY INTAKE:  24-hr recall:  B ( AM): nutrigrain bar with sweet tea, will skip 6  Snk ( AM): none  L ( PM): wraps with [place] from [employer] with salad with soda (20 oz) Snk ( PM): none D ( PM): pasta with chicken and bread with sweet tea Snk ( PM): chips or ice cream (not everyday) Beverages: drinks mostly sweet tea (16 oz) or water with flavor packet  Usual physical activity: walk and elliptical for 30 minutes 5 x day/week   Estimated energy needs: 2200 calories 248 g carbohydrates 165 g protein 61 g fat  Progress Towards Goal(s):  In progress.   Nutritional Diagnosis:  NB-1.1 Food and nutrition-related knowledge deficit As related to large portion sizes and meal skipping.  As evidenced by BMI of 42.2.    Intervention:  Nutrition counseling provided. Discussed appropriate portion sizes and explained the importance of eating 3 meals and 2-3 snacks per day. Recommend paying attention to fullness cues and avoiding distractions such as eating with the TV on.   Plan: Eat 3 meals per day and 2-3 snacks with protein. Switch beverages to mostly water or no calorie beverages. Fill half of your plate with vegetables, limit start to a quarter of plate, and protein the size of the palm of your hand. Continue getting 30 minutes of physical activity 5 x day.   Consider having only a small amount of potato chips at night in a small bowl or try peanut butter crackers/bananas.  Eat meals at the table with the TV off.  Remember the two fists rule - serve up small portions of food and you can eat more if you are hungry after 20 minutes.    Handouts given during visit include:  MyPlate Handout  40J CHO Snacks  Monitoring/Evaluation:  Dietary intake, exercise, mindful eating, and body weight in 4 week(s).

## 2013-07-14 NOTE — Patient Instructions (Addendum)
Eat 3 meals per day and 2-3 snacks with protein. Switch beverages to mostly water or no calorie beverages. Fill half of your plate with vegetables, limit start to a quarter of plate, and protein the size of the palm of your hand. Continue getting 30 minutes of physical activity 5 x day.  Consider having only a small amount of potato chips at night in a small bowl or try peanut butter crackers/bananas.  Eat meals at the table with the TV off.  Remember the two fists rule - serve up small portions of food and you can eat more if you are hungry after 20 minutes.

## 2013-07-15 ENCOUNTER — Encounter: Payer: Self-pay | Admitting: Internal Medicine

## 2013-07-23 ENCOUNTER — Other Ambulatory Visit: Payer: Self-pay | Admitting: Internal Medicine

## 2013-07-29 ENCOUNTER — Encounter: Payer: Self-pay | Admitting: Family Medicine

## 2013-08-11 ENCOUNTER — Ambulatory Visit: Payer: BC Managed Care – PPO | Admitting: Dietician

## 2013-09-10 ENCOUNTER — Other Ambulatory Visit: Payer: Self-pay | Admitting: Internal Medicine

## 2013-09-16 ENCOUNTER — Ambulatory Visit (INDEPENDENT_AMBULATORY_CARE_PROVIDER_SITE_OTHER): Payer: Managed Care, Other (non HMO) | Admitting: Family Medicine

## 2013-09-16 VITALS — BP 130/90 | HR 93 | Temp 98.9°F | Wt 343.0 lb

## 2013-09-16 DIAGNOSIS — M722 Plantar fascial fibromatosis: Secondary | ICD-10-CM

## 2013-09-16 DIAGNOSIS — I10 Essential (primary) hypertension: Secondary | ICD-10-CM

## 2013-09-16 DIAGNOSIS — J069 Acute upper respiratory infection, unspecified: Secondary | ICD-10-CM

## 2013-09-16 MED ORDER — LOSARTAN POTASSIUM-HCTZ 100-12.5 MG PO TABS: 1.0000 | ORAL_TABLET | Freq: Every day | ORAL | Status: DC

## 2013-09-16 MED ORDER — AMLODIPINE BESYLATE 10 MG PO TABS: 10.0000 mg | ORAL_TABLET | Freq: Every day | ORAL | Status: DC

## 2013-09-16 NOTE — Progress Notes (Signed)
  Subjective:    Patient ID: Vincent Cantu, male    DOB: 1971/09/26, 42 y.o.   MRN: [id number]  HPI He has a 2 day history of sore throat, nasal congestion and cough. No fever, earache, he also is been having difficulty with right heel pain especially when he gets up in the morning or if he sits for long periods of time. He would also like his blood pressure medications readjusted to help cut down on the cost.   Review of Systems     Objective:   Physical Exam alert and in no distress. Tympanic membranes and canals are normal. Throat is clear. Tonsils are normal. Neck is supple without adenopathy or thyromegaly. Cardiac exam shows a regular sinus rhythm without murmurs or gallops. Lungs are clear to auscultation. Tender to palpation over the calcaneal spur with good motion of the ankle. No Achilles tendon pain. Foot is relatively flat.        Assessment & Plan:  HYPERTENSION - Plan: losartan-hydrochlorothiazide (HYZAAR) 100-12.5 MG per tablet, amLODipine (NORVASC) 10 MG tablet  URI (upper respiratory infection)  Plantar fasciitis of right foot  his medications were readjusted to cut down on cost. History turned here in one month for recheck on that. No therapy for the URI. Discussed proper care of plantar fasciitis with stretching, heel cups and possibly use of arch supports. Return here in one month.

## 2013-09-16 NOTE — Patient Instructions (Signed)
Stop the HydroDIURIL and the Azor. start the 2 new medications. Come back here in one month

## 2013-10-06 ENCOUNTER — Other Ambulatory Visit: Payer: Self-pay | Admitting: Internal Medicine

## 2013-10-12 ENCOUNTER — Other Ambulatory Visit: Payer: BC Managed Care – PPO

## 2013-10-15 ENCOUNTER — Other Ambulatory Visit: Payer: Self-pay | Admitting: Internal Medicine

## 2013-10-18 ENCOUNTER — Encounter: Payer: BC Managed Care – PPO | Admitting: Internal Medicine

## 2013-10-21 ENCOUNTER — Encounter: Payer: Self-pay | Admitting: Family Medicine

## 2013-10-21 ENCOUNTER — Ambulatory Visit (INDEPENDENT_AMBULATORY_CARE_PROVIDER_SITE_OTHER): Payer: Managed Care, Other (non HMO) | Admitting: Family Medicine

## 2013-10-21 VITALS — BP 126/84 | HR 96 | Wt 340.0 lb

## 2013-10-21 DIAGNOSIS — I10 Essential (primary) hypertension: Secondary | ICD-10-CM

## 2013-10-21 MED ORDER — METOPROLOL SUCCINATE ER 100 MG PO TB24: ORAL_TABLET | ORAL | Status: DC

## 2013-10-21 MED ORDER — ATORVASTATIN CALCIUM 20 MG PO TABS: ORAL_TABLET | ORAL | Status: DC

## 2013-10-21 NOTE — Progress Notes (Signed)
   Subjective:    Patient ID: Vincent Cantu, male    DOB: 01/04/1971, 42 y.o.   MRN: [id number]  HPI Will put her in a new blood pressure medication. He was started to help cut down on the cost of his medications. He has no particular concerns or complaints.   Review of Systems     Objective:   Physical Exam Alert and in no distress. Blood pressure is recorded.       Assessment & Plan:  HYPERTENSION  he will continue on his present medication regimen. Encouraged him to call me at the beginning of the year when he goes on to a different nsurance plan to get 90 day supply.

## 2013-12-21 ENCOUNTER — Other Ambulatory Visit: Payer: Self-pay | Admitting: Family Medicine

## 2013-12-22 ENCOUNTER — Other Ambulatory Visit: Payer: Self-pay | Admitting: Internal Medicine

## 2014-01-05 ENCOUNTER — Telehealth: Payer: Self-pay | Admitting: Family Medicine

## 2014-01-05 DIAGNOSIS — I10 Essential (primary) hypertension: Secondary | ICD-10-CM

## 2014-01-05 NOTE — Telephone Encounter (Signed)
[facility] for Atorvastatin 20 mg 90 day with 4 refills, Amlodipine 10 mg 90 day with 4 refills,Metoprolo 100 mg succ er tab 90 day with 4 refills, Losartan/hctz 100/12h  90 day with 4 refills.

## 2014-01-06 MED ORDER — AMLODIPINE BESYLATE 10 MG PO TABS: 10.0000 mg | ORAL_TABLET | Freq: Every day | ORAL | Status: DC

## 2014-01-06 MED ORDER — ATORVASTATIN CALCIUM 20 MG PO TABS: ORAL_TABLET | ORAL | Status: DC

## 2014-01-06 MED ORDER — METOPROLOL SUCCINATE ER 100 MG PO TB24: ORAL_TABLET | ORAL | Status: DC

## 2014-01-06 NOTE — Telephone Encounter (Signed)
Refilled all med except losartan/hctz. It looks like he discontinued that med

## 2014-01-10 ENCOUNTER — Telehealth: Payer: Self-pay | Admitting: Internal Medicine

## 2014-01-10 NOTE — Telephone Encounter (Signed)
Refill request for losartan/hctz 100-12.5mg  [facility]

## 2014-01-10 NOTE — Telephone Encounter (Signed)
Looks like med was discontinued, faxed back to pharmacy

## 2014-01-16 ENCOUNTER — Other Ambulatory Visit: Payer: Self-pay | Admitting: Family Medicine

## 2014-01-17 NOTE — Telephone Encounter (Signed)
Medication sent in. 

## 2014-01-17 NOTE — Telephone Encounter (Signed)
Forwarding to you :)

## 2014-02-02 ENCOUNTER — Ambulatory Visit (INDEPENDENT_AMBULATORY_CARE_PROVIDER_SITE_OTHER): Payer: BC Managed Care – PPO | Admitting: Family Medicine

## 2014-02-02 ENCOUNTER — Encounter: Payer: Self-pay | Admitting: Family Medicine

## 2014-02-02 VITALS — BP 130/92 | HR 68 | Temp 98.5°F | Wt 339.0 lb

## 2014-02-02 DIAGNOSIS — J029 Acute pharyngitis, unspecified: Secondary | ICD-10-CM

## 2014-02-02 DIAGNOSIS — G479 Sleep disorder, unspecified: Secondary | ICD-10-CM

## 2014-02-02 LAB — POCT RAPID STREP A (OFFICE): Rapid Strep A Screen: NEGATIVE

## 2014-02-02 NOTE — Progress Notes (Signed)
   Subjective:    Patient ID: Vincent Cantu, male    DOB: [birth year], 43 y.o.   MRN: [id number]  HPI 3 days ago he woke up with sore throat, nasal congestion, dry cough, chills . No ear ache. He does not smoke. He also has difficulty with his sleep. He states that 2 years ago he went from third to first shift and has had difficulty since then. Further discussion indicates he is waking up every few hours stating that he feels as if his bladder is full. When he does urinate it is light yellow. He has tried to cut back on fluids after 8:00 at night.   Review of Systems     Objective:   Physical Exam alert and in no distress. Tympanic membranes and canals are normal. Throat is clear. Tonsils are normal. Neck is supple without adenopathy or thyromegaly. Cardiac exam shows a regular sinus rhythm without murmurs or gallops. Lungs are clear to auscultation. Strep screen is negative       Assessment & Plan:  Acute pharyngitis  Sleep disturbance  recommend supportive care for the pharyngitis and if continued difficulty after 7-10 days, he will call me. Also discussed cutting back further on his fluids. Recommend no fluids after dinner around 6:00. If he continues to have difficulty, I will have him return for followup.

## 2014-02-02 NOTE — Patient Instructions (Addendum)
Pharyngitis Pharyngitis is redness, pain, and swelling (inflammation) of your pharynx.  CAUSES  Pharyngitis is usually caused by infection. Most of the time, these infections are from viruses (viral) and are part of a cold. However, sometimes pharyngitis is caused by bacteria (bacterial). Pharyngitis can also be caused by allergies. Viral pharyngitis may be spread from person to person by coughing, sneezing, and personal items or utensils (cups, forks, spoons, toothbrushes). Bacterial pharyngitis may be spread from person to person by more intimate contact, such as kissing.  SIGNS AND SYMPTOMS  Symptoms of pharyngitis include:   Sore throat.   Tiredness (fatigue).   Low-grade fever.   Headache.  Joint pain and muscle aches.  Skin rashes.  Swollen lymph nodes.  Plaque-like film on throat or tonsils (often seen with bacterial pharyngitis). DIAGNOSIS  Your health care provider will ask you questions about your illness and your symptoms. Your medical history, along with a physical exam, is often all that is needed to diagnose pharyngitis. Sometimes, a rapid strep test is done. Other lab tests may also be done, depending on the suspected cause.  TREATMENT  Viral pharyngitis will usually get better in 3 4 days without the use of medicine. Bacterial pharyngitis is treated with medicines that kill germs (antibiotics).  HOME CARE INSTRUCTIONS   Drink enough water and fluids to keep your urine clear or pale yellow.   Only take over-the-counter or prescription medicines as directed by your health care provider:   If you are prescribed antibiotics, make sure you finish them even if you start to feel better.   Do not take aspirin.   Get lots of rest.   Gargle with 8 oz of salt water ( tsp of salt per 1 qt of water) as often as every 1 2 hours to soothe your throat.   Throat lozenges (if you are not at risk for choking) or sprays may be used to soothe your throat. SEEK MEDICAL  CARE IF:   You have large, tender lumps in your neck.  You have a rash.  You cough up green, yellow-brown, or bloody spit. SEEK IMMEDIATE MEDICAL CARE IF:   Your neck becomes stiff.  You drool or are unable to swallow liquids.  You vomit or are unable to keep medicines or liquids down.  You have severe pain that does not go away with the use of recommended medicines.  You have trouble breathing (not caused by a stuffy nose). MAKE SURE YOU:   Understand these instructions.  Will watch your condition.  Will get help right away if you are not doing well or get worse. Document Released: 11/04/2005 Document Revised: 08/25/2013 Document Reviewed: 07/12/2013 [facility] Patient Information 2014 [place]. No fluids after dinner make sure you him to your bladder before you go to bed

## 2014-04-21 ENCOUNTER — Ambulatory Visit (INDEPENDENT_AMBULATORY_CARE_PROVIDER_SITE_OTHER): Payer: BC Managed Care – PPO | Admitting: Family Medicine

## 2014-04-21 ENCOUNTER — Encounter: Payer: Self-pay | Admitting: Family Medicine

## 2014-04-21 VITALS — BP 140/86 | Wt 348.0 lb

## 2014-04-21 DIAGNOSIS — I10 Essential (primary) hypertension: Secondary | ICD-10-CM

## 2014-04-21 MED ORDER — LOSARTAN POTASSIUM-HCTZ 100-12.5 MG PO TABS: ORAL_TABLET | ORAL | Status: DC

## 2014-04-21 MED ORDER — AMLODIPINE BESYLATE 10 MG PO TABS: 10.0000 mg | ORAL_TABLET | Freq: Every day | ORAL | Status: DC

## 2014-04-21 MED ORDER — METOPROLOL SUCCINATE ER 100 MG PO TB24: ORAL_TABLET | ORAL | Status: DC

## 2014-04-21 NOTE — Progress Notes (Signed)
   Subjective:    Patient ID: Vincent Cantu, male    DOB: 1971-04-27, 43 y.o.   MRN: [id number]  HPI He is here for recheck on his blood pressure. Presently he is on Toprol and Norvasc however did recently run out of Hyzaar.   Review of Systems     Objective:   Physical Exam Alert and in no distress. Blood pressure is recorded.       Assessment & Plan:  HYPERTENSION - Plan: metoprolol succinate (TOPROL-XL) 100 MG 24 hr tablet, losartan-hydrochlorothiazide (HYZAAR) 100-12.5 MG per tablet, amLODipine (NORVASC) 10 MG tablet  I will do all of his medications today and keep him on the present 3.

## 2014-05-18 DIAGNOSIS — E119 Type 2 diabetes mellitus without complications: Secondary | ICD-10-CM

## 2014-05-18 HISTORY — DX: Type 2 diabetes mellitus without complications: E11.9

## 2014-06-06 ENCOUNTER — Encounter: Payer: Self-pay | Admitting: Medical

## 2014-06-06 ENCOUNTER — Ambulatory Visit (INDEPENDENT_AMBULATORY_CARE_PROVIDER_SITE_OTHER): Payer: BC Managed Care – PPO | Admitting: Medical

## 2014-06-06 VITALS — BP 112/70 | HR 92 | Temp 98.0°F | Resp 16 | Wt 313.0 lb

## 2014-06-06 DIAGNOSIS — R3589 Other polyuria: Secondary | ICD-10-CM

## 2014-06-06 DIAGNOSIS — R682 Dry mouth, unspecified: Secondary | ICD-10-CM

## 2014-06-06 DIAGNOSIS — R358 Other polyuria: Secondary | ICD-10-CM

## 2014-06-06 DIAGNOSIS — E669 Obesity, unspecified: Secondary | ICD-10-CM

## 2014-06-06 DIAGNOSIS — R252 Cramp and spasm: Secondary | ICD-10-CM

## 2014-06-06 DIAGNOSIS — K117 Disturbances of salivary secretion: Secondary | ICD-10-CM

## 2014-06-06 DIAGNOSIS — R209 Unspecified disturbances of skin sensation: Secondary | ICD-10-CM

## 2014-06-06 DIAGNOSIS — R202 Paresthesia of skin: Secondary | ICD-10-CM

## 2014-06-06 DIAGNOSIS — I10 Essential (primary) hypertension: Secondary | ICD-10-CM

## 2014-06-06 LAB — BASIC METABOLIC PANEL
BUN: 21 mg/dL (ref 6–23)
CO2: 25 mEq/L (ref 19–32)
CREATININE: 1.67 mg/dL — AB (ref 0.50–1.35)
Calcium: 10.1 mg/dL (ref 8.4–10.5)
Chloride: 92 mEq/L — ABNORMAL LOW (ref 96–112)
Glucose, Bld: 517 mg/dL (ref 70–99)
Potassium: 4.3 mEq/L (ref 3.5–5.3)
Sodium: 130 mEq/L — ABNORMAL LOW (ref 135–145)

## 2014-06-06 LAB — LIPID PANEL
CHOL/HDL RATIO: 5.4 ratio
CHOLESTEROL: 145 mg/dL (ref 0–200)
HDL: 27 mg/dL — ABNORMAL LOW (ref >39)
LDL Cholesterol: 75 mg/dL (ref 0–99)
Triglycerides: 214 mg/dL — ABNORMAL HIGH (ref <150)
VLDL: 43 mg/dL — ABNORMAL HIGH (ref 0–40)

## 2014-06-06 LAB — POCT URINALYSIS DIPSTICK
BILIRUBIN UA: NEGATIVE
Blood, UA: NEGATIVE
Glucose, UA: 500
Leukocytes, UA: NEGATIVE
Nitrite, UA: NEGATIVE
PROTEIN UA: NEGATIVE
Urobilinogen, UA: NEGATIVE
pH, UA: 5

## 2014-06-06 LAB — HEPATIC FUNCTION PANEL
ALT: 43 U/L (ref 0–53)
AST: 31 U/L (ref 0–37)
Albumin: 4.2 g/dL (ref 3.5–5.2)
Alkaline Phosphatase: 132 U/L — ABNORMAL HIGH (ref 39–117)
BILIRUBIN INDIRECT: 0.8 mg/dL (ref 0.2–1.2)
Bilirubin, Direct: 0.3 mg/dL (ref 0.0–0.3)
Total Bilirubin: 1.1 mg/dL (ref 0.2–1.2)
Total Protein: 8.3 g/dL (ref 6.0–8.3)

## 2014-06-06 LAB — TSH: TSH: 1.13 u[IU]/mL (ref 0.350–4.500)

## 2014-06-06 LAB — CBC
HCT: 41.5 % (ref 39.0–52.0)
Hemoglobin: 13.6 g/dL (ref 13.0–17.0)
MCH: 26.9 pg (ref 26.0–34.0)
MCHC: 32.8 g/dL (ref 30.0–36.0)
MCV: 82 fL (ref 78.0–100.0)
PLATELETS: 222 10*3/uL (ref 150–400)
RBC: 5.06 MIL/uL (ref 4.22–5.81)
RDW: 13.9 % (ref 11.5–15.5)
WBC: 7.6 10*3/uL (ref 4.0–10.5)

## 2014-06-06 NOTE — Progress Notes (Signed)
   Subjective:    Patient ID: Vincent Cantu, male    DOB: July 30, 1971, 43 y.o.   MRN: [id number]  HPI  Here for several symptoms he attributes to medication, Losartan HCT.  Was started on this in January.  Had run out of the medication for a few months, and it was restarted last visit June.  Since then he notes dry mouth for 3 weeks. Has been having tingling in left index finger and thumb, frequent urination ~20x (nocturia every hour) x 3 wk, weight loss, leg cramps at night, wakes him up at night, fatigue.  Diet consists of a banana or fast food for breakfast or lunch, some salads or vegetables but also starchy foods like pastas, drinks plenty of water, cut back on sweet teas, 20 oz soda per day. Elliptical for 30 minutes just a few times a month. He is unable to tell if he's had vision changes.  Last meal last night.  Fasting this morning.  Still taking Norvasc, Toprol, been on those for years.  Non-smoker, not drinking alcohol.  denies urinary hesitance, dribbling.  No pelvic pain.  No other aggravating or relieving factors.  No other c/o.  Review of Systems As in subjective.    Objective:   Physical Exam  BP 112/70  Pulse 92  Temp(Src) 98 F (36.7 C) (Oral)  Resp 16  Wt 313 lb (141.976 kg)  Wt Readings from Last 3 Encounters:  06/06/14 313 lb (141.976 kg)  04/21/14 348 lb (157.852 kg)  02/02/14 339 lb (153.769 kg)   BP Readings from Last 3 Encounters:  06/06/14 112/70  04/21/14 140/86  02/02/14 130/92    General appearance: alert, no distress, WD/WN, obese African-American male HEENT: normocephalic, sclerae anicteric, TMs pearly, nares patent, no discharge or erythema, pharynx normal Oral cavity: MMM, no lesions Neck: supple, no lymphadenopathy, no thyromegaly, no masses Heart: RRR, normal S1, S2, no murmurs Lungs: CTA bilaterally, no wheezes, rhonchi, or rales Abdomen: +bs, soft, non tender, non distended, umbilical hernia non-reducible nontender, no hepatomegaly, no  splenomegaly Pulses: 2+ symmetric, upper extremities, normal cap refill, pedal pulses 1+ Ext: no edema Neuro: +tinels and phalens on left hand, otherwise nonfocal exam       Assessment & Plan:   Encounter Diagnoses  Name Primary?  . Polyuria   . Dry mouth Yes  . Essential hypertension, benign   . Cramp of both lower extremities   . Paresthesia   . Obesity, unspecified    We discussed possible causes of his symptoms.  Could be diuretic related, doesn't have typical BPH symptoms, but can't rule out new onset diabetes.   discussed his weight, medications, possible side effects, diet, exercise.   Advised the need to work harder on weight loss, improving diet, getting more exercise.  Will call with lab results and plan.

## 2014-06-07 ENCOUNTER — Ambulatory Visit (INDEPENDENT_AMBULATORY_CARE_PROVIDER_SITE_OTHER): Payer: BC Managed Care – PPO | Admitting: Medical

## 2014-06-07 ENCOUNTER — Telehealth: Payer: Self-pay | Admitting: Medical

## 2014-06-07 ENCOUNTER — Encounter: Payer: Self-pay | Admitting: Medical

## 2014-06-07 VITALS — BP 120/80 | HR 80 | Temp 98.2°F | Resp 16 | Wt 313.0 lb

## 2014-06-07 DIAGNOSIS — E669 Obesity, unspecified: Secondary | ICD-10-CM

## 2014-06-07 DIAGNOSIS — R682 Dry mouth, unspecified: Secondary | ICD-10-CM

## 2014-06-07 DIAGNOSIS — R358 Other polyuria: Secondary | ICD-10-CM

## 2014-06-07 DIAGNOSIS — K117 Disturbances of salivary secretion: Secondary | ICD-10-CM

## 2014-06-07 DIAGNOSIS — R3589 Other polyuria: Secondary | ICD-10-CM

## 2014-06-07 DIAGNOSIS — E119 Type 2 diabetes mellitus without complications: Secondary | ICD-10-CM

## 2014-06-07 LAB — POCT URINALYSIS DIPSTICK
Bilirubin, UA: NEGATIVE
Blood, UA: NEGATIVE
Glucose, UA: 500
LEUKOCYTES UA: NEGATIVE
Nitrite, UA: NEGATIVE
PROTEIN UA: NEGATIVE
Spec Grav, UA: <=1.005
UROBILINOGEN UA: NEGATIVE
pH, UA: 5

## 2014-06-07 LAB — INSULIN, RANDOM: INSULIN: 52 u[IU]/mL — AB (ref 3–28)

## 2014-06-07 LAB — KETONES, QUALITATIVE: Acetone, Bld: NEGATIVE

## 2014-06-07 LAB — POCT CBG (FASTING - GLUCOSE)-MANUAL ENTRY: Glucose Fasting, POC: 566 mg/dL — AB (ref 70–99)

## 2014-06-07 LAB — C-PEPTIDE: C-Peptide: 3.01 ng/mL (ref 0.80–3.90)

## 2014-06-07 LAB — HEMOGLOBIN A1C
Hgb A1c MFr Bld: 14.1 % — ABNORMAL HIGH (ref <5.7)
MEAN PLASMA GLUCOSE: 358 mg/dL — AB (ref <117)

## 2014-06-07 MED ORDER — INSULIN ASPART 100 UNIT/ML ~~LOC~~ SOLN: 10.0000 [IU] | Freq: Three times a day (TID) | SUBCUTANEOUS | Status: DC

## 2014-06-07 MED ORDER — INSULIN DETEMIR 100 UNIT/ML ~~LOC~~ SOLN: 10.0000 [IU] | Freq: Two times a day (BID) | SUBCUTANEOUS | Status: DC

## 2014-06-07 NOTE — Telephone Encounter (Signed)
pls call as he left before I was actually done.  I just wanted to make sure he was clear on instructions, comfortable with the plan.    If so, good, and I want him to call tomorrow to give me glucose readings for the last 24 hours.    We may adjust insulin if numbers still quite high.   Just make sure he is doing 10 units Novolog before meals, and 10 units Levemir BID long acting insulin.

## 2014-06-07 NOTE — Patient Instructions (Signed)
Thank you for giving me the opportunity to serve you today.    Your diagnosis today includes: Encounter Diagnoses  Name Primary?  . Diabetes mellitus, new onset Yes  . Dry mouth      Specific recommendations today include:  Begin Novolog meal time insulin, 10 units with meals.  This is a FAST acting insulin.  Begin Levemir LONG ACTING insulin, 10 units morning and 10 units night time.  Start making the diet changes we discussed  Increase water intake as we discussed  Check glucose BEFORE each meal and at bedtime, and log them in the chart.   Call back daily with some glucose updates for me  Return call back 48 hours with glucose readings.    I have included other useful information below for your review.  Diabetes Meal Planning Guide The diabetes meal planning guide is a tool to help you plan your meals and snacks. It is important for people with diabetes to manage their blood glucose (sugar) levels. Choosing the right foods and the right amounts throughout your day will help control your blood glucose. Eating right can even help you improve your blood pressure and reach or maintain a healthy weight.  CARBOHYDRATE COUNTING MADE EASY When you eat carbohydrates, they turn to sugar. This raises your blood glucose level. Counting carbohydrates can help you control this level so you feel better. When you plan your meals by counting carbohydrates, you can have more flexibility in what you eat and balance your medicine with your food intake.  Carbohydrate counting simply means adding up the total amount of carbohydrate grams in your meals and snacks. Try to eat about the same amount at each meal. Foods with carbohydrates are listed below.   Each portion below is 1 carbohydrate serving or 15 grams of carbohydrates.    1800 Calorie Diet for Diabetes Meal Planning The 1800 calorie diet is designed for eating up to 1800 calories each day. Following this diet and making healthy meal  choices can help improve overall health. This diet controls blood sugar (glucose) levels and can also help lower blood pressure and cholesterol.  SERVING SIZES Measuring foods and serving sizes helps to make sure you are getting the right amount of food. The list below tells how big or small some common serving sizes are:  1 oz.........4 stacked dice.  3 oz........Marland KitchenDeck of cards.  1 tsp.......Marland KitchenTip of little finger.  1 tbs......Marland KitchenMarland KitchenThumb.  2 tbs.......Marland KitchenGolf ball.   cup......Marland KitchenHalf of a fist.  1 cup.......Marland KitchenA fist.   GUIDELINES FOR CHOOSING FOODS The goal of this diet is to eat a variety of foods and limit calories to 1800 each day. This can be done by choosing foods that are low in calories and fat. The diet also suggests eating small amounts of food frequently. Doing this helps control your blood glucose levels so they do not get too high or too low. Each meal or snack may include a protein food source to help you feel more satisfied and to stabilize your blood glucose. Try to eat about the same amount of food around the same time each day. This includes weekend days, travel days, and days off work. Space your meals about 4 to 5 hours apart and add a snack between them if you wish.  For example, a daily food plan could include breakfast, a morning snack, lunch, dinner, and an evening snack. Healthy meals and snacks include whole grains, vegetables, fruits, lean meats, poultry, fish, and dairy products. As you plan  your meals, select a variety of foods. Choose from the bread and starch, vegetable, fruit, dairy, and meat/protein groups. Examples of foods from each group and their suggested serving sizes are listed below. Use measuring cups and spoons to become familiar with what a healthy portion looks like. Bread and Starch Each serving equals 15 grams of carbohydrates.  1 slice bread.   bagel.   cup cold cereal (unsweetened).   cup hot cereal or mashed potatoes.  1 small potato  (size of a computer mouse).   cup cooked pasta or rice.   English muffin.  1 cup broth-based soup.  3 cups of popcorn.  4 to 6 whole-wheat crackers.   cup cooked beans, peas, or corn. Vegetable Each serving equals 5 grams of carbohydrates.   cup cooked vegetables.  1 cup raw vegetables.   cup tomato or vegetable juice. Fruit Each serving equals 15 grams of carbohydrates.  1 small apple or orange.  1 cup watermelon or strawberries.   cup applesauce (no sugar added).  2 tbs raisins.   banana.   cup canned fruit, packed in water, its own juice, or sweetened with a sugar substitute.   cup unsweetened fruit juice. Dairy Each serving equals 12 to 15 grams of carbohydrates.  1 cup fat-free milk.  6 oz artificially sweetened yogurt or plain yogurt.  1 cup low-fat buttermilk.  1 cup soy milk.  1 cup almond milk. Meat/Protein  1 large egg.  2 to 3 oz meat, poultry, or fish.   cup low-fat cottage cheese.  1 tbs peanut butter.  1 oz low-fat cheese.   cup tuna in water.   cup tofu. Fat  1 tsp oil.  1 tsp trans-fat-free margarine.  1 tsp butter.  1 tsp mayonnaise.  2 tbs avocado.  1 tbs salad dressing.  1 tbs cream cheese.  2 tbs sour cream.  SAMPLE 1800 CALORIE DIET PLAN Breakfast   cup unsweetened cereal (1 carb serving).  1 cup fat-free milk (1 carb serving).  1 slice whole-wheat toast (1 carb serving).   small banana (1 carb serving).  1 scrambled egg.  1 tsp trans-fat-free margarine. Lunch  Tuna sandwich.  2 slices whole-wheat bread (2 carb servings).   cup canned tuna in water, drained.  1 tbs reduced fat mayonnaise.  1 stalk celery, chopped.  2 slices tomato.  1 lettuce leaf.  1 cup carrot sticks.  24 to 30 seedless grapes (2 carb servings).  6 oz light yogurt (1 carb serving). Afternoon Snack  3 graham cracker squares (1 carb serving).  Fat-free milk, 1 cup (1 carb serving).  1 tbs  peanut butter. Dinner  3 oz salmon, broiled with 1 tsp oil.  1 cup mashed potatoes (2 carb servings) with 1 tsp trans-fat-free margarine.  1 cup fresh or frozen green beans.  1 cup steamed asparagus.  1 cup fat-free milk (1 carb serving). Evening Snack  3 cups air-popped popcorn (1 carb serving).  2 tbs parmesan cheese sprinkled on top.  MEAL PLAN Use this worksheet to help you make a daily meal plan based on the 1800 calorie diet suggestions. If you are using this plan to help you control your blood glucose, you may interchange carbohydrate-containing foods (dairy, starches, and fruits). Select a variety of fresh foods of varying colors and flavors. The total amount of carbohydrate in your meals or snacks is more important than making sure you include all of the food groups every time you eat. Choose from the  following foods to build your day's meals:  8 Starches.  4 Vegetables.  3 Fruits.  2 Dairy.  6 to 7 oz Meat/Protein.  Up to 4 Fats.  Your dietician can use this worksheet to help you decide how many servings and which types of foods are right for you. BREAKFAST Food Group and Servings / Food Choice Starch ________________________________________________________ Dairy _________________________________________________________ Fruit _________________________________________________________ Meat/Protein __________________________________________________ Fat ___________________________________________________________ LUNCH Food Group and Servings / Food Choice Starch ________________________________________________________ Meat/Protein __________________________________________________ Vegetable _____________________________________________________ Fruit _________________________________________________________ Dairy _________________________________________________________ Fat ___________________________________________________________ Wilhemina Bonito Food Group  and Servings / Food Choice Starch ________________________________________________________ Meat/Protein __________________________________________________ Fruit __________________________________________________________ Dairy _________________________________________________________ Wonda Cheng Food Group and Servings / Food Choice Starch _________________________________________________________ Meat/Protein ___________________________________________________ Dairy __________________________________________________________ Vegetable ______________________________________________________ Fruit ___________________________________________________________ Fat ____________________________________________________________ Fermin Schwab Food Group and Servings / Food Choice Fruit __________________________________________________________ Meat/Protein ___________________________________________________ Dairy __________________________________________________________ Starch _________________________________________________________ DAILY TOTALS Starch ____________________________ Vegetable _________________________ Fruit _____________________________ Dairy _____________________________ Meat/Protein______________________ Fat _______________________________ Document Released: 05/27/2005 Document Revised: 01/27/2012 Document Reviewed: 09/20/2011 [employer] Patient Information 2013 [place], [place].

## 2014-06-07 NOTE — Progress Notes (Signed)
   Subjective:    Patient ID: Vincent Cantu, male    DOB: 09/12/1971, 43 y.o.   MRN: [id number]  HPI  Here for work in appt at my request.  We saw him yesterday morning for symptoms concerning for diabetes, but after getting labs back abnormal, had him return for aggressive therapy.   This morning he feels fine, no c/o.  He did take to heart the diet and exercise recommendations we discussed yesterday.   Again accompanied by wife today.  Hx/o from yesterday - lately having several symptoms he attributes to medication, Losartan HCT.  Was started on this in January.  Had run out of the medication for a few months, and it was restarted last visit June.  Since then he notes dry mouth for 3 weeks. Has been having tingling in left index finger and thumb, frequent urination ~20x (nocturia every hour) x 3 wk, weight loss, leg cramps at night, wakes him up at night, fatigue.  Diet consists of a banana or fast food for breakfast or lunch, some salads or vegetables but also starchy foods like pastas, drinks plenty of water, cut back on sweet teas, 20 oz soda per day. Elliptical for 30 minutes just a few times a month. He is unable to tell if he's had vision changes.  Last meal last night.  Fasting this morning.  Still taking Norvasc, Toprol, been on those for years.  Non-smoker, not drinking alcohol.  denies urinary hesitance, dribbling.  No pelvic pain.  No other aggravating or relieving factors.  No other c/o.  Review of Systems As in subjective.    Objective:   Physical Exam  BP 120/80  Pulse 80  Temp(Src) 98.2 F (36.8 C) (Oral)  Resp 16  Wt 313 lb (141.976 kg)   General appearance: alert, no distress, WD/WN, obese African-American male HEENT: normocephalic, sclerae anicteric, TMs pearly, nares patent, no discharge or erythema, pharynx normal Oral cavity: MMM, no lesions Neck: supple, no lymphadenopathy, no thyromegaly, no masses Heart: RRR, normal S1, S2, no murmurs Lungs: CTA bilaterally, no  wheezes, rhonchi, or rales Abdomen: +bs, soft, non tender, non distended, umbilical hernia reducible nontender, no hepatomegaly, no splenomegaly Pulses: 2+ symmetric, upper extremities, normal cap refill, pedal pulses 1+ Ext: no edema      Assessment & Plan:   Encounter Diagnoses  Name Primary?  . Diabetes mellitus, new onset Yes  . Dry mouth   . Obesity, unspecified   . Polyuria    Discussed the new diabetes diagnosis.   Additional labs today.  Discussed the need to get him out of the toxic range quickly.    Today CMA educated him on diabetic testing, glucometer kit given, he was educated on use of long acting and meal time insulins.  Gave first injections of Levemir 10u and Novolog 10u in office.    Discussed diet and education in great detail.  Educated about hypoglycemia.   Specific recommendations today include:  Begin Novolog meal time insulin, 10 units with meals.  This is a FAST acting insulin.  Begin Levemir LONG ACTING insulin, 10 units morning and 10 units night time.  Start making the diet changes we discussed  Increase water intake as we discussed  Check glucose BEFORE each meal and at bedtime, and log them in the chart.   Call back daily with some glucose updates for me  We will monitor closely the next few days.

## 2014-06-08 ENCOUNTER — Telehealth: Payer: Self-pay | Admitting: Family Medicine

## 2014-06-08 ENCOUNTER — Telehealth: Payer: Self-pay | Admitting: Medical

## 2014-06-08 NOTE — Telephone Encounter (Signed)
Patients wife called and said the husbands before lunch BS was 420. She wanted to know if they should be concerned? I explain to her that Dorothea Ogle [facility] and Dr. Tomi Bamberger wanted to decrease his BS slowly and not too fast, but if her husband is having any Sx. Of increase sweating or trouble breathing to please call us or head over to the ER. I ask them to please call us back on Friday to give [place] an up date on his BS reading after increasing his Novolog to 12 units 3 x a day. CLS

## 2014-06-08 NOTE — Telephone Encounter (Signed)
What was pre breakfast glucose this morning?  I assume he gave himself 10 Novolog and 10 Levemir this morning, correct?  If pre-breakfast glucose this morning was over 300, then bump up meal time insulin to 12 units each meal the next few days and lets see how this does.

## 2014-06-08 NOTE — Telephone Encounter (Signed)
Patient states that his pre- breakfast reading was 345. I advise him to up his Novolog to 12 units each meal. I ask him to call us back around lunch time with a up date of his Sx. CLS

## 2014-06-10 ENCOUNTER — Telehealth: Payer: Self-pay | Admitting: Medical

## 2014-06-10 DIAGNOSIS — R7989 Other specified abnormal findings of blood chemistry: Secondary | ICD-10-CM

## 2014-06-10 NOTE — Telephone Encounter (Signed)
Entered in error

## 2014-06-10 NOTE — Telephone Encounter (Signed)
Patient called in glucose numbers Thursday 7/24 All numbers Wednesday mid to high 300s, some low 400s. I increased his Levemir to 13 u BID and Novolog increased to 15 u TID   Patient called in glucose number Friday 7/25: All numbers Thursday low to mid 300s QID I advised he increase levemir to 16u BID, increase mealtime Novolog to 16u TID  Advise he return Monday for BMET lab, and we will likely add Metformin at that time.

## 2014-06-13 ENCOUNTER — Other Ambulatory Visit: Payer: BC Managed Care – PPO

## 2014-06-13 ENCOUNTER — Other Ambulatory Visit: Payer: Self-pay | Admitting: Family Medicine

## 2014-06-13 ENCOUNTER — Telehealth: Payer: Self-pay | Admitting: Internal Medicine

## 2014-06-13 DIAGNOSIS — R7989 Other specified abnormal findings of blood chemistry: Secondary | ICD-10-CM

## 2014-06-13 LAB — BASIC METABOLIC PANEL
BUN: 18 mg/dL (ref 6–23)
CALCIUM: 10 mg/dL (ref 8.4–10.5)
CO2: 23 mEq/L (ref 19–32)
CREATININE: 1.49 mg/dL — AB (ref 0.50–1.35)
Chloride: 101 mEq/L (ref 96–112)
Glucose, Bld: 200 mg/dL — ABNORMAL HIGH (ref 70–99)
Potassium: 3.9 mEq/L (ref 3.5–5.3)
Sodium: 133 mEq/L — ABNORMAL LOW (ref 135–145)

## 2014-06-13 NOTE — Telephone Encounter (Signed)
Patient came in for his lab work and he also drop off FMLA forms to be filled out. CLS     Patient is aware of Dorothea Ogle [facility] message. CLS

## 2014-06-13 NOTE — Telephone Encounter (Signed)
Pt gave his blood sugar reading. Yesterday @ 10:05am 194, @ 12:54pm 251, @2 :31pm 251, @7 :59pm 174, and this morning it was 140.  Also pt can not make it in this morning for his labs

## 2014-06-13 NOTE — Telephone Encounter (Signed)
I didn't actually take him out of work other than the time needed for appt here.  FMLA is for protecting job when missing work.  I don't anticipate him needing to be out of work other than the 2 times I saw him, so FMLA not warrented.

## 2014-06-13 NOTE — Telephone Encounter (Signed)
Have him continue current insulin regimen, but I need him to check lab/BMET ASAP

## 2014-06-14 NOTE — Telephone Encounter (Signed)
Patient states that his FMLA is intermit in case he needs to be out any as far as a full day to a half a day due to his high blood pressure and his diabetes. CLS

## 2014-06-14 NOTE — Telephone Encounter (Signed)
I am not trying to be difficult, but he should not be out regularly for either issue. If he is having that much symptoms even now that he's been on insulin then have him back in for office visit to figure out a better plan.  We wouldn't expect him to miss work for either issue other than the 2 times he was out for recent visits

## 2014-06-15 NOTE — Telephone Encounter (Signed)
Get paper back to me

## 2014-06-15 NOTE — Telephone Encounter (Signed)
I called the patient back and I went over your message with him in detail. He states that he has to have this in for his office to cover his job for when he has to come in for our office for doctor appointments. CLS

## 2014-06-21 ENCOUNTER — Telehealth: Payer: Self-pay | Admitting: Medical

## 2014-06-21 ENCOUNTER — Other Ambulatory Visit: Payer: Self-pay | Admitting: Family Medicine

## 2014-06-21 MED ORDER — INSULIN DETEMIR 100 UNIT/ML ~~LOC~~ SOLN: 10.0000 [IU] | Freq: Two times a day (BID) | SUBCUTANEOUS | Status: DC

## 2014-06-21 MED ORDER — INSULIN ASPART 100 UNIT/ML ~~LOC~~ SOLN: 10.0000 [IU] | Freq: Three times a day (TID) | SUBCUTANEOUS | Status: DC

## 2014-06-21 NOTE — Telephone Encounter (Signed)
I sent over the refill for his insulin to the [facility]. CLS

## 2014-06-22 ENCOUNTER — Telehealth: Payer: Self-pay | Admitting: Medical

## 2014-06-22 NOTE — Telephone Encounter (Signed)
I sent a 30 day supply to his local pharmacy until he receives his medication through the mail. CLS

## 2014-06-23 ENCOUNTER — Other Ambulatory Visit: Payer: Self-pay

## 2014-06-23 NOTE — Telephone Encounter (Signed)
Called new prescription for Levemir Flex pen to pharmacy, patient uses 18 units bid, and I provided 3 refills.

## 2014-06-28 ENCOUNTER — Encounter: Payer: Self-pay | Admitting: Medical

## 2014-06-28 ENCOUNTER — Ambulatory Visit (INDEPENDENT_AMBULATORY_CARE_PROVIDER_SITE_OTHER): Payer: BC Managed Care – PPO | Admitting: Medical

## 2014-06-28 ENCOUNTER — Telehealth: Payer: Self-pay | Admitting: Medical

## 2014-06-28 VITALS — BP 120/78 | HR 74 | Temp 98.1°F | Resp 16 | Wt 315.0 lb

## 2014-06-28 DIAGNOSIS — IMO0001 Reserved for inherently not codable concepts without codable children: Secondary | ICD-10-CM

## 2014-06-28 DIAGNOSIS — E1165 Type 2 diabetes mellitus with hyperglycemia: Principal | ICD-10-CM

## 2014-06-28 DIAGNOSIS — I10 Essential (primary) hypertension: Secondary | ICD-10-CM

## 2014-06-28 DIAGNOSIS — N289 Disorder of kidney and ureter, unspecified: Secondary | ICD-10-CM

## 2014-06-28 NOTE — Progress Notes (Signed)
Subjective: His last visit in July he was a new onset symptomatic diabetic with glucose in the 450-500 range. We started him on mealtime insulin around 16 units each meal and 18 units Levemir twice daily. Week by week he gradually has seen his sugar is improving as he is significantly modified his diet and exercise. In the last 2 weeks his numbers pre-meal 3 times a day have ranged in the mid 100s to low 100s.  He call the after hours line last week and Dr. Redmond School had him stop the mealtime insulin and just use Levemir twice daily 18u each which he is doing.  He feels much better, he is happy that he has been able to get his diet and exercising check. He has no complaints with the medication.  No other concerns  ROS as in subjective  Objective: Filed Vitals:   06/28/14 1419  BP: 120/78  Pulse: 74  Temp: 98.1 F (36.7 C)  Resp: 16    General appearence: alert, no distress, WD/WN  Assessment: Encounter Diagnoses  Name Primary?  . Type II or unspecified type diabetes mellitus without mention of complication, uncontrolled Yes  . Renal insufficiency   . Essential hypertension, benign    Plan: We reviewed his last several week glucose readings.  3-4 weeks ago he was in the mid to low 200 range pre-meal.  In the last 2 weeks he has seen a significant decrease down into the low to mid 100 glucose premeal.  He has significantly make diet changes, exercise changes, exercising daily, drinking a lot of water, limiting portion size, cut out sweets and soda.  A week ago he stopped mealtime NovoLog insulin after calling the after hours line and speaking with Dr Redmond School about his sugars.  He will continue Levemir at 18 units twice daily.  Labs today, and if his renal function is back down we'll consider adding metformin or other medication.  congratuated him on his efforts.  He has stopped NovoLog. Continue the medications as usual

## 2014-06-28 NOTE — Telephone Encounter (Signed)
error 

## 2014-06-29 ENCOUNTER — Other Ambulatory Visit: Payer: Self-pay | Admitting: Medical

## 2014-06-29 LAB — BASIC METABOLIC PANEL
BUN: 12 mg/dL (ref 6–23)
CO2: 26 mEq/L (ref 19–32)
Calcium: 9.8 mg/dL (ref 8.4–10.5)
Chloride: 106 mEq/L (ref 96–112)
Creat: 1.25 mg/dL (ref 0.50–1.35)
Glucose, Bld: 113 mg/dL — ABNORMAL HIGH (ref 70–99)
POTASSIUM: 3.8 meq/L (ref 3.5–5.3)
SODIUM: 140 meq/L (ref 135–145)

## 2014-06-29 MED ORDER — INSULIN DETEMIR 100 UNIT/ML FLEXPEN: 18.0000 [IU] | PEN_INJECTOR | Freq: Two times a day (BID) | SUBCUTANEOUS | Status: DC

## 2014-06-29 MED ORDER — METFORMIN HCL 1000 MG PO TABS: 1000.0000 mg | ORAL_TABLET | Freq: Two times a day (BID) | ORAL | Status: DC

## 2014-06-30 ENCOUNTER — Telehealth: Payer: Self-pay

## 2014-06-30 NOTE — Telephone Encounter (Signed)
Spoke with patient about FMLA dates, which he confirms. Faxed forms to [employer] per Audelia Acton.

## 2014-07-08 ENCOUNTER — Telehealth: Payer: Self-pay | Admitting: Family Medicine

## 2014-07-08 NOTE — Telephone Encounter (Signed)
Please call blood sugar at 292, normally runs about 100-120   Just ate

## 2014-07-08 NOTE — Telephone Encounter (Signed)
PT WAS ASKED WHEN DID HE LAST ATE HE SAID 30 MIN AGO I ASKED WHEN DID HE TAKE THAT BLOOD SUGAR READING HE SAID 15 MIN AGO I HAD TO LET HIM KNOW THAT HE IS TO TEST BEFORE EATING OR 2 HRS AFTER AND TO DISREGARD THAT READING PT VERBALIZED UNDERSTANDING

## 2014-07-11 ENCOUNTER — Telehealth: Payer: Self-pay | Admitting: Internal Medicine

## 2014-07-11 NOTE — Telephone Encounter (Signed)
Pt called stating that his FMLA was not done correctly and needs the duration of the immediate flma and would like to be 6 months for him to finish the year out incase he needs to come to the doctor or something else. Please correct

## 2014-07-11 NOTE — Telephone Encounter (Signed)
I am not sure what the issue is, I just took the phone call and sent it back to you. Also I have put fmla papers on your desk for review.

## 2014-07-11 NOTE — Telephone Encounter (Signed)
I completed the forms, and updated the forms appropriately.  Not sure what the issue is.   You can get the prior form to me to review.

## 2014-07-13 NOTE — Telephone Encounter (Signed)
See forms

## 2014-07-13 NOTE — Telephone Encounter (Signed)
Vincent Cantu- shane says these forms should be coming up from the back. If you see these can you fax these back.

## 2014-07-13 NOTE — Telephone Encounter (Signed)
I add ended the FMLA to show up to 4 hours missed every 3 months for office visit.  These forms are generally good for 6-7mo depending upon the employer policy.   Thus, this should be adequate.

## 2014-07-13 NOTE — Telephone Encounter (Signed)
This needs to be faxed back, so please fax forms back as I do not have them.

## 2014-07-14 NOTE — Telephone Encounter (Signed)
done

## 2014-07-18 ENCOUNTER — Other Ambulatory Visit: Payer: Self-pay | Admitting: Family Medicine

## 2014-07-19 ENCOUNTER — Telehealth: Payer: Self-pay | Admitting: Medical

## 2014-07-19 ENCOUNTER — Other Ambulatory Visit: Payer: Self-pay | Admitting: Family Medicine

## 2014-07-19 ENCOUNTER — Other Ambulatory Visit: Payer: Self-pay | Admitting: Medical

## 2014-07-19 MED ORDER — METFORMIN HCL 1000 MG PO TABS: 1000.0000 mg | ORAL_TABLET | Freq: Two times a day (BID) | ORAL | Status: DC

## 2014-07-19 MED ORDER — INSULIN DETEMIR 100 UNIT/ML FLEXPEN: 18.0000 [IU] | PEN_INJECTOR | Freq: Two times a day (BID) | SUBCUTANEOUS | Status: DC

## 2014-07-19 NOTE — Telephone Encounter (Signed)
Rx sent. CLS

## 2014-07-19 NOTE — Telephone Encounter (Signed)
i sent Metformin.  Please call out the needles.

## 2014-07-29 ENCOUNTER — Telehealth: Payer: Self-pay | Admitting: Family Medicine

## 2014-07-29 NOTE — Telephone Encounter (Signed)
   Please call re: FMLA forms   Forms state 3 hours but there is no duration

## 2014-07-29 NOTE — Telephone Encounter (Signed)
pls add/amend to 3 hours per quarter for office visits.

## 2014-07-29 NOTE — Telephone Encounter (Signed)
This one was for you

## 2014-08-01 NOTE — Telephone Encounter (Signed)
Forward to [employer]

## 2014-08-02 NOTE — Telephone Encounter (Signed)
LMOM TO CB. CLS 

## 2014-08-03 NOTE — Telephone Encounter (Signed)
Patient is aware of [facility] message. CLS

## 2014-08-03 NOTE — Telephone Encounter (Signed)
Vincent Cantu states that he has filled out the forms and there is nothing more that he can do on the forms. CLS

## 2014-08-23 ENCOUNTER — Ambulatory Visit (INDEPENDENT_AMBULATORY_CARE_PROVIDER_SITE_OTHER): Payer: BC Managed Care – PPO | Admitting: Family Medicine

## 2014-08-23 ENCOUNTER — Encounter: Payer: Self-pay | Admitting: Family Medicine

## 2014-08-23 VITALS — BP 130/86 | HR 71 | Ht 75.0 in | Wt 300.0 lb

## 2014-08-23 DIAGNOSIS — E119 Type 2 diabetes mellitus without complications: Secondary | ICD-10-CM

## 2014-08-23 DIAGNOSIS — I10 Essential (primary) hypertension: Secondary | ICD-10-CM

## 2014-08-23 DIAGNOSIS — E782 Mixed hyperlipidemia: Secondary | ICD-10-CM

## 2014-08-23 DIAGNOSIS — Z23 Encounter for immunization: Secondary | ICD-10-CM

## 2014-08-23 DIAGNOSIS — Z0000 Encounter for general adult medical examination without abnormal findings: Secondary | ICD-10-CM

## 2014-08-23 DIAGNOSIS — E1159 Type 2 diabetes mellitus with other circulatory complications: Secondary | ICD-10-CM

## 2014-08-23 DIAGNOSIS — E1169 Type 2 diabetes mellitus with other specified complication: Secondary | ICD-10-CM

## 2014-08-23 DIAGNOSIS — I152 Hypertension secondary to endocrine disorders: Secondary | ICD-10-CM

## 2014-08-23 LAB — POCT URINALYSIS DIPSTICK
Bilirubin, UA: NEGATIVE
Blood, UA: NEGATIVE
Glucose, UA: NEGATIVE
KETONES UA: NEGATIVE
LEUKOCYTES UA: NEGATIVE
Nitrite, UA: NEGATIVE
PROTEIN UA: NEGATIVE
Urobilinogen, UA: NEGATIVE
pH, UA: 7

## 2014-08-23 LAB — POCT GLYCOSYLATED HEMOGLOBIN (HGB A1C): Hemoglobin A1C: 7.2

## 2014-08-23 LAB — LIPID PANEL
CHOL/HDL RATIO: 3.5 ratio
Cholesterol: 115 mg/dL (ref 0–200)
HDL: 33 mg/dL — ABNORMAL LOW (ref >39)
LDL CALC: 70 mg/dL (ref 0–99)
Triglycerides: 62 mg/dL (ref <150)
VLDL: 12 mg/dL (ref 0–40)

## 2014-08-23 NOTE — Patient Instructions (Signed)
Down to 16 units twice a day and monitor your blood sugars. As long as her blood sugars stay under 120 you can keep reducing your insulin. Go to the American diabetes association website or [facility]

## 2014-08-23 NOTE — Progress Notes (Signed)
Subjective:    Patient ID: Vincent Cantu, male    DOB: [birth year], 43 y.o.   MRN: [id number]  HPI He is here for a complete examination. He was recently diagnosed with diabetes and did have a hemoglobin A1c of 14. Since then he has made major dietary and exercise changes. He is also placed on insulin. He has been checking his blood sugars and states that in the mornings they run between 90 and 110. He is taking his insulin twice per day and continues on metformin. He has not been signed up to go to the diabetes education class as yet. He is exercising regularly. His wife is helping with his dietary modification. He is now taking food to work rather than eating out as he used to do. He has no other concerns or complaints. Family and social history was reviewed  Review of Systems  All other systems reviewed and are negative.      Objective:   Physical Exam BP 130/86  Pulse 71  Ht 6\' 3"  (1.905 m)  Wt 300 lb (136.079 kg)  BMI 37.50 kg/m2  SpO2 98%  General Appearance:    Alert, cooperative, no distress, appears stated age  Head:    Normocephalic, without obvious abnormality, atraumatic  Eyes:    PERRL, conjunctiva/corneas clear, EOM's intact, fundi    benign  Ears:    Normal TM's and external ear canals  Nose:   Nares normal, mucosa normal, no drainage or sinus   tenderness  Throat:   Lips, mucosa, and tongue normal; teeth and gums normal  Neck:   Supple, no lymphadenopathy;  thyroid:  no   enlargement/tenderness/nodules; no carotid   bruit or JVD  Back:    Spine nontender, no curvature, ROM normal, no CVA     tenderness  Lungs:     Clear to auscultation bilaterally without wheezes, rales or     ronchi; respirations unlabored  Chest Wall:    No tenderness or deformity   Heart:    Regular rate and rhythm, S1 and S2 normal, no murmur, rub   or gallop  Breast Exam:    No chest wall tenderness, masses or gynecomastia  Abdomen:     Soft, non-tender, nondistended, normoactive bowel  sounds,    no masses, no hepatosplenomegaly  Genitalia:   deferred   Rectal:   deferred   Extremities:   No clubbing, cyanosis or edema  Pulses:   2+ and symmetric all extremities  Skin:   Skin color, texture, turgor normal, no rashes or lesions  Lymph nodes:   Cervical, supraclavicular, and axillary nodes normal  Neurologic:   CNII-XII intact, normal strength, sensation and gait; reflexes 2+ and symmetric throughout          Psych:   Normal mood, affect, hygiene and grooming.          Assessment & Plan:  Routine general medical examination at a health care facility  Hypertension associated with diabetes  Morbid obesity  Immunization due - Plan: Flu Vaccine QUAD 36+ mos IM, Tdap vaccine greater than or equal to 7yo IM  Diabetes mellitus without complication - Plan: POCT glycosylated hemoglobin (Hb A1C), POCT Urinalysis Dipstick, Amb Referral to Nutrition and Diabetic E  Mixed hyperlipidemia due to type 2 diabetes mellitus - Plan: Lipid panel  he does work and 95 Monday through Friday and will therefore need an FMLA which has been filled out. He is to call me in one month to let me know  how his blood sugars are doing. Would like to slowly withdraw him from insulin and will probably need at least one other oral medication. I will do this over the next several months. Strongly encouraged him to continue with his diet and exercise regimen. He will be sent to diabetes and nutrition classes. He and his wife do tend to have good idea what to do for his eating habits.

## 2014-08-28 ENCOUNTER — Encounter (HOSPITAL_COMMUNITY): Payer: BC Managed Care – PPO | Admitting: Anesthesiology

## 2014-08-28 ENCOUNTER — Encounter (HOSPITAL_COMMUNITY): Payer: Self-pay | Admitting: Emergency Medicine

## 2014-08-28 ENCOUNTER — Encounter (HOSPITAL_COMMUNITY): Admission: EM | Disposition: A | Discharge status: Home / Self Care | Payer: Self-pay | Source: Home / Self Care

## 2014-08-28 ENCOUNTER — Emergency Department (INDEPENDENT_AMBULATORY_CARE_PROVIDER_SITE_OTHER)
Admission: EM | Admit: 2014-08-28 | Discharge: 2014-08-28 | Disposition: A | Discharge status: Nursing Home (Includes ICF) | Payer: BC Managed Care – PPO | Source: Home / Self Care | Attending: Family Medicine | Admitting: Family Medicine

## 2014-08-28 ENCOUNTER — Observation Stay (HOSPITAL_COMMUNITY): Payer: BC Managed Care – PPO | Admitting: Anesthesiology

## 2014-08-28 ENCOUNTER — Observation Stay (HOSPITAL_COMMUNITY): Payer: BC Managed Care – PPO

## 2014-08-28 ENCOUNTER — Inpatient Hospital Stay (HOSPITAL_COMMUNITY)
Admission: EM | Admit: 2014-08-28 | Discharge: 2014-09-01 | DRG: 355 | Disposition: A | Discharge status: Home / Self Care | Payer: BC Managed Care – PPO | Attending: General Surgery | Admitting: General Surgery

## 2014-08-28 DIAGNOSIS — I10 Essential (primary) hypertension: Secondary | ICD-10-CM | POA: Diagnosis present

## 2014-08-28 DIAGNOSIS — K430 Incisional hernia with obstruction, without gangrene: Secondary | ICD-10-CM | POA: Diagnosis present

## 2014-08-28 DIAGNOSIS — E785 Hyperlipidemia, unspecified: Secondary | ICD-10-CM | POA: Diagnosis present

## 2014-08-28 DIAGNOSIS — E119 Type 2 diabetes mellitus without complications: Secondary | ICD-10-CM | POA: Diagnosis present

## 2014-08-28 DIAGNOSIS — Z833 Family history of diabetes mellitus: Secondary | ICD-10-CM

## 2014-08-28 DIAGNOSIS — K460 Unspecified abdominal hernia with obstruction, without gangrene: Secondary | ICD-10-CM

## 2014-08-28 DIAGNOSIS — Y93K1 Activity, walking an animal: Secondary | ICD-10-CM

## 2014-08-28 DIAGNOSIS — K420 Umbilical hernia with obstruction, without gangrene: Principal | ICD-10-CM | POA: Diagnosis present

## 2014-08-28 DIAGNOSIS — K436 Other and unspecified ventral hernia with obstruction, without gangrene: Secondary | ICD-10-CM

## 2014-08-28 HISTORY — PX: VENTRAL HERNIA REPAIR: SHX424

## 2014-08-28 LAB — URINALYSIS, ROUTINE W REFLEX MICROSCOPIC
BILIRUBIN URINE: NEGATIVE
Glucose, UA: NEGATIVE mg/dL
HGB URINE DIPSTICK: NEGATIVE
KETONES UR: NEGATIVE mg/dL
Leukocytes, UA: NEGATIVE
Nitrite: NEGATIVE
PH: 7 (ref 5.0–8.0)
Protein, ur: NEGATIVE mg/dL
Specific Gravity, Urine: 1.009 (ref 1.005–1.030)
Urobilinogen, UA: 1 mg/dL (ref 0.0–1.0)

## 2014-08-28 LAB — CBC WITH DIFFERENTIAL/PLATELET
Basophils Absolute: 0 10*3/uL (ref 0.0–0.1)
Basophils Relative: 1 % (ref 0–1)
Eosinophils Absolute: 0 10*3/uL (ref 0.0–0.7)
Eosinophils Relative: 1 % (ref 0–5)
HCT: 37.7 % — ABNORMAL LOW (ref 39.0–52.0)
HEMOGLOBIN: 12 g/dL — AB (ref 13.0–17.0)
LYMPHS ABS: 1.3 10*3/uL (ref 0.7–4.0)
LYMPHS PCT: 24 % (ref 12–46)
MCH: 26.2 pg (ref 26.0–34.0)
MCHC: 31.8 g/dL (ref 30.0–36.0)
MCV: 82.3 fL (ref 78.0–100.0)
MONOS PCT: 10 % (ref 3–12)
Monocytes Absolute: 0.5 10*3/uL (ref 0.1–1.0)
NEUTROS ABS: 3.4 10*3/uL (ref 1.7–7.7)
NEUTROS PCT: 64 % (ref 43–77)
PLATELETS: 209 10*3/uL (ref 150–400)
RBC: 4.58 MIL/uL (ref 4.22–5.81)
RDW: 13.3 % (ref 11.5–15.5)
WBC: 5.3 10*3/uL (ref 4.0–10.5)

## 2014-08-28 LAB — COMPREHENSIVE METABOLIC PANEL
ALK PHOS: 86 U/L (ref 39–117)
ALT: 11 U/L (ref 0–53)
AST: 18 U/L (ref 0–37)
Albumin: 3.8 g/dL (ref 3.5–5.2)
Anion gap: 13 (ref 5–15)
BUN: 14 mg/dL (ref 6–23)
CHLORIDE: 104 meq/L (ref 96–112)
CO2: 23 mEq/L (ref 19–32)
Calcium: 8.9 mg/dL (ref 8.4–10.5)
Creatinine, Ser: 1.13 mg/dL (ref 0.50–1.35)
GFR calc non Af Amer: 77 mL/min — ABNORMAL LOW (ref >90)
GFR, EST AFRICAN AMERICAN: 89 mL/min — AB (ref >90)
GLUCOSE: 101 mg/dL — AB (ref 70–99)
POTASSIUM: 3.5 meq/L — AB (ref 3.7–5.3)
Sodium: 140 mEq/L (ref 137–147)
TOTAL PROTEIN: 7.8 g/dL (ref 6.0–8.3)
Total Bilirubin: 0.4 mg/dL (ref 0.3–1.2)

## 2014-08-28 LAB — GLUCOSE, CAPILLARY
GLUCOSE-CAPILLARY: 84 mg/dL (ref 70–99)
GLUCOSE-CAPILLARY: 106 mg/dL — AB (ref 70–99)

## 2014-08-28 LAB — CBG MONITORING, ED: Glucose-Capillary: 91 mg/dL (ref 70–99)

## 2014-08-28 LAB — SURGICAL PCR SCREEN
MRSA, PCR: NEGATIVE
Staphylococcus aureus: NEGATIVE

## 2014-08-28 SURGERY — REPAIR, HERNIA, VENTRAL
Anesthesia: General | Site: Abdomen

## 2014-08-28 MED ORDER — HYDROMORPHONE HCL 1 MG/ML IJ SOLN
0.2500 mg | INTRAMUSCULAR | Status: DC | PRN
  Administered 2014-08-28 (×4): 0.5 mg via INTRAVENOUS

## 2014-08-28 MED ORDER — POVIDONE-IODINE 10 % EX OINT
TOPICAL_OINTMENT | CUTANEOUS | Status: AC
  Filled 2014-08-28: qty 28.35

## 2014-08-28 MED ORDER — LOSARTAN POTASSIUM-HCTZ 100-12.5 MG PO TABS: 1.0000 | ORAL_TABLET | Freq: Every day | ORAL | Status: DC

## 2014-08-28 MED ORDER — LOSARTAN POTASSIUM 50 MG PO TABS
100.0000 mg | ORAL_TABLET | Freq: Every day | ORAL | Status: DC
  Administered 2014-08-28 – 2014-09-01 (×5): 100 mg via ORAL
  Filled 2014-08-28 (×5): qty 2

## 2014-08-28 MED ORDER — POTASSIUM CHLORIDE IN NACL 20-0.9 MEQ/L-% IV SOLN
INTRAVENOUS | Status: DC
  Administered 2014-08-28: 17:00:00 via INTRAVENOUS
  Filled 2014-08-28 (×2): qty 1000

## 2014-08-28 MED ORDER — MIDAZOLAM HCL 2 MG/2ML IJ SOLN
INTRAMUSCULAR | Status: AC
  Filled 2014-08-28: qty 2

## 2014-08-28 MED ORDER — ENOXAPARIN SODIUM 40 MG/0.4ML ~~LOC~~ SOLN
40.0000 mg | SUBCUTANEOUS | Status: DC
  Administered 2014-08-29 – 2014-08-31 (×3): 40 mg via SUBCUTANEOUS
  Filled 2014-08-28 (×6): qty 0.4

## 2014-08-28 MED ORDER — FENTANYL CITRATE 0.05 MG/ML IJ SOLN
INTRAMUSCULAR | Status: AC
  Filled 2014-08-28: qty 5

## 2014-08-28 MED ORDER — DIPHENHYDRAMINE HCL 50 MG/ML IJ SOLN: 12.5000 mg | Freq: Four times a day (QID) | INTRAMUSCULAR | Status: DC | PRN

## 2014-08-28 MED ORDER — GLYCOPYRROLATE 0.2 MG/ML IJ SOLN
INTRAMUSCULAR | Status: DC | PRN
  Administered 2014-08-28: .8 mg via INTRAVENOUS

## 2014-08-28 MED ORDER — INSULIN ASPART 100 UNIT/ML ~~LOC~~ SOLN
0.0000 [IU] | Freq: Three times a day (TID) | SUBCUTANEOUS | Status: DC
  Administered 2014-08-29 – 2014-08-31 (×2): 1 [IU] via SUBCUTANEOUS

## 2014-08-28 MED ORDER — NEOSTIGMINE METHYLSULFATE 10 MG/10ML IV SOLN
INTRAVENOUS | Status: DC | PRN
  Administered 2014-08-28: 5 mg via INTRAVENOUS

## 2014-08-28 MED ORDER — HYDROMORPHONE HCL 1 MG/ML IJ SOLN
INTRAMUSCULAR | Status: AC
  Filled 2014-08-28: qty 1

## 2014-08-28 MED ORDER — DIPHENHYDRAMINE HCL 12.5 MG/5ML PO ELIX: 12.5000 mg | ORAL_SOLUTION | Freq: Four times a day (QID) | ORAL | Status: DC | PRN

## 2014-08-28 MED ORDER — HYDROMORPHONE HCL 1 MG/ML IJ SOLN
INTRAMUSCULAR | Status: AC
Start: 2014-08-28 — End: 2014-08-29
  Filled 2014-08-28: qty 1

## 2014-08-28 MED ORDER — ONDANSETRON HCL 4 MG/2ML IJ SOLN
INTRAMUSCULAR | Status: DC | PRN
  Administered 2014-08-28: 4 mg via INTRAVENOUS

## 2014-08-28 MED ORDER — HYDROMORPHONE HCL 1 MG/ML IJ SOLN
1.0000 mg | Freq: Once | INTRAMUSCULAR | Status: AC
  Administered 2014-08-28: 1 mg via INTRAVENOUS
  Filled 2014-08-28: qty 1

## 2014-08-28 MED ORDER — CEFAZOLIN SODIUM-DEXTROSE 2-3 GM-% IV SOLR
2.0000 g | INTRAVENOUS | Status: AC
  Administered 2014-08-28: 2 g via INTRAVENOUS
  Filled 2014-08-28 (×2): qty 50

## 2014-08-28 MED ORDER — ACETAMINOPHEN 325 MG PO TABS: 650.0000 mg | ORAL_TABLET | Freq: Four times a day (QID) | ORAL | Status: DC | PRN

## 2014-08-28 MED ORDER — SUCCINYLCHOLINE CHLORIDE 20 MG/ML IJ SOLN
INTRAMUSCULAR | Status: DC | PRN
  Administered 2014-08-28: 120 mg via INTRAVENOUS

## 2014-08-28 MED ORDER — FENTANYL CITRATE 0.05 MG/ML IJ SOLN
INTRAMUSCULAR | Status: DC | PRN
  Administered 2014-08-28: 50 ug via INTRAVENOUS
  Administered 2014-08-28: 100 ug via INTRAVENOUS
  Administered 2014-08-28: 150 ug via INTRAVENOUS
  Administered 2014-08-28: 100 ug via INTRAVENOUS

## 2014-08-28 MED ORDER — POVIDONE-IODINE 10 % EX OINT
TOPICAL_OINTMENT | CUTANEOUS | Status: DC | PRN
  Administered 2014-08-28: 1 via TOPICAL

## 2014-08-28 MED ORDER — ROCURONIUM BROMIDE 100 MG/10ML IV SOLN
INTRAVENOUS | Status: DC | PRN
  Administered 2014-08-28: 10 mg via INTRAVENOUS
  Administered 2014-08-28: 40 mg via INTRAVENOUS
  Administered 2014-08-28: 20 mg via INTRAVENOUS
  Administered 2014-08-28: 10 mg via INTRAVENOUS

## 2014-08-28 MED ORDER — POTASSIUM CHLORIDE IN NACL 20-0.9 MEQ/L-% IV SOLN
INTRAVENOUS | Status: DC
  Administered 2014-08-29: 02:00:00 via INTRAVENOUS
  Filled 2014-08-28 (×4): qty 1000

## 2014-08-28 MED ORDER — ONDANSETRON HCL 4 MG/2ML IJ SOLN
4.0000 mg | Freq: Once | INTRAMUSCULAR | Status: AC
  Administered 2014-08-28: 4 mg via INTRAVENOUS
  Filled 2014-08-28: qty 2

## 2014-08-28 MED ORDER — ONDANSETRON HCL 4 MG/2ML IJ SOLN
4.0000 mg | Freq: Four times a day (QID) | INTRAMUSCULAR | Status: DC | PRN
  Administered 2014-08-29 – 2014-08-30 (×3): 4 mg via INTRAVENOUS
  Filled 2014-08-28 (×4): qty 2

## 2014-08-28 MED ORDER — MORPHINE SULFATE 2 MG/ML IJ SOLN
1.0000 mg | INTRAMUSCULAR | Status: DC | PRN
  Administered 2014-08-28 – 2014-08-30 (×11): 4 mg via INTRAVENOUS
  Filled 2014-08-28 (×12): qty 2

## 2014-08-28 MED ORDER — PROPOFOL 10 MG/ML IV BOLUS
INTRAVENOUS | Status: AC
  Filled 2014-08-28: qty 20

## 2014-08-28 MED ORDER — PROPOFOL 10 MG/ML IV BOLUS
INTRAVENOUS | Status: DC | PRN
  Administered 2014-08-28: 200 mg via INTRAVENOUS

## 2014-08-28 MED ORDER — MIDAZOLAM HCL 2 MG/2ML IJ SOLN
INTRAMUSCULAR | Status: DC | PRN
  Administered 2014-08-28: 2 mg via INTRAVENOUS

## 2014-08-28 MED ORDER — AMLODIPINE BESYLATE 10 MG PO TABS
10.0000 mg | ORAL_TABLET | Freq: Every day | ORAL | Status: DC
  Administered 2014-08-28 – 2014-09-01 (×5): 10 mg via ORAL
  Filled 2014-08-28 (×5): qty 1

## 2014-08-28 MED ORDER — LACTATED RINGERS IV SOLN
INTRAVENOUS | Status: DC | PRN
  Administered 2014-08-28 (×3): via INTRAVENOUS

## 2014-08-28 MED ORDER — METOPROLOL SUCCINATE ER 100 MG PO TB24
100.0000 mg | ORAL_TABLET | Freq: Every day | ORAL | Status: DC
  Administered 2014-08-28 – 2014-09-01 (×5): 100 mg via ORAL
  Filled 2014-08-28 (×5): qty 1

## 2014-08-28 MED ORDER — LIDOCAINE HCL (CARDIAC) 20 MG/ML IV SOLN
INTRAVENOUS | Status: DC | PRN
  Administered 2014-08-28: 60 mg via INTRAVENOUS

## 2014-08-28 MED ORDER — IOHEXOL 300 MG/ML  SOLN
50.0000 mL | Freq: Once | INTRAMUSCULAR | Status: AC | PRN
  Administered 2014-08-28: 50 mL via ORAL

## 2014-08-28 MED ORDER — HYDROCHLOROTHIAZIDE 12.5 MG PO CAPS
12.5000 mg | ORAL_CAPSULE | Freq: Every day | ORAL | Status: DC
  Administered 2014-08-28 – 2014-09-01 (×5): 12.5 mg via ORAL
  Filled 2014-08-28 (×5): qty 1

## 2014-08-28 MED ORDER — ACETAMINOPHEN 650 MG RE SUPP: 650.0000 mg | Freq: Four times a day (QID) | RECTAL | Status: DC | PRN

## 2014-08-28 MED ORDER — LIDOCAINE HCL (CARDIAC) 20 MG/ML IV SOLN
INTRAVENOUS | Status: AC
  Filled 2014-08-28: qty 5

## 2014-08-28 MED ORDER — 0.9 % SODIUM CHLORIDE (POUR BTL) OPTIME
TOPICAL | Status: DC | PRN
  Administered 2014-08-28 (×2): 1000 mL

## 2014-08-28 SURGICAL SUPPLY — 52 items
BINDER ABD UNIV 12 45-62 (WOUND CARE) ×1 IMPLANT
BINDER ABDOMINAL 46IN 62IN (WOUND CARE)
BLADE SURG ROTATE 9660 (MISCELLANEOUS) ×1 IMPLANT
CANISTER SUCTION 2500CC (MISCELLANEOUS) ×2 IMPLANT
CHLORAPREP W/TINT 26ML (MISCELLANEOUS) ×2 IMPLANT
COVER SURGICAL LIGHT HANDLE (MISCELLANEOUS) ×2 IMPLANT
DRAIN JACKSON PRT FLT 10 (DRAIN) ×1 IMPLANT
DRAIN JACKSON RD 7FR 3/32 (WOUND CARE) IMPLANT
DRAPE LAPAROSCOPIC ABDOMINAL (DRAPES) ×2 IMPLANT
DRAPE UTILITY 15X26 W/TAPE STR (DRAPE) ×4 IMPLANT
ELECT CAUTERY BLADE 6.4 (BLADE) ×2 IMPLANT
ELECT REM PT RETURN 9FT ADLT (ELECTROSURGICAL) ×2
ELECTRODE REM PT RTRN 9FT ADLT (ELECTROSURGICAL) ×1 IMPLANT
EVACUATOR SILICONE 100CC (DRAIN) ×1 IMPLANT
GAUZE SPONGE 4X4 12PLY STRL (GAUZE/BANDAGES/DRESSINGS) ×1 IMPLANT
GLOVE BIOGEL PI IND STRL 7.0 (GLOVE) IMPLANT
GLOVE BIOGEL PI IND STRL 7.5 (GLOVE) IMPLANT
GLOVE BIOGEL PI IND STRL 8 (GLOVE) ×1 IMPLANT
GLOVE BIOGEL PI INDICATOR 7.0 (GLOVE) ×1
GLOVE BIOGEL PI INDICATOR 7.5 (GLOVE) ×1
GLOVE BIOGEL PI INDICATOR 8 (GLOVE) ×1
GLOVE ECLIPSE 7.5 STRL STRAW (GLOVE) ×4 IMPLANT
GOWN L4 XXLG W/PAP TWL (GOWN DISPOSABLE) ×1 IMPLANT
GOWN STRL REUS W/ TWL LRG LVL3 (GOWN DISPOSABLE) ×3 IMPLANT
GOWN STRL REUS W/TWL LRG LVL3 (GOWN DISPOSABLE) ×4
KIT BASIN OR (CUSTOM PROCEDURE TRAY) ×2 IMPLANT
KIT ROOM TURNOVER OR (KITS) ×2 IMPLANT
NS IRRIG 1000ML POUR BTL (IV SOLUTION) ×2 IMPLANT
PACK GENERAL/GYN (CUSTOM PROCEDURE TRAY) ×2 IMPLANT
PAD ARMBOARD 7.5X6 YLW CONV (MISCELLANEOUS) ×2 IMPLANT
SPONGE GAUZE 4X4 12PLY STER LF (GAUZE/BANDAGES/DRESSINGS) ×1 IMPLANT
SPONGE LAP 18X18 X RAY DECT (DISPOSABLE) ×1 IMPLANT
STAPLER VISISTAT 35W (STAPLE) ×1 IMPLANT
SUT ETHIBOND NAB CTX #1 30IN (SUTURE) IMPLANT
SUT ETHILON 3 0 FSL (SUTURE) ×1 IMPLANT
SUT MNCRL AB 4-0 PS2 18 (SUTURE) ×1 IMPLANT
SUT NOVA 1 T20/GS 25DT (SUTURE) ×3 IMPLANT
SUT NOVA T20/GS 25 (SUTURE) ×2 IMPLANT
SUT PROLENE 1 CT (SUTURE) IMPLANT
SUT SILK 2 0 (SUTURE) ×2
SUT SILK 2-0 18XBRD TIE 12 (SUTURE) ×1 IMPLANT
SUT VIC AB 2-0 CT1 27 (SUTURE)
SUT VIC AB 2-0 CT1 TAPERPNT 27 (SUTURE) IMPLANT
SUT VIC AB 3-0 CT1 27 (SUTURE) ×4
SUT VIC AB 3-0 CT1 TAPERPNT 27 (SUTURE) IMPLANT
SUT VICRYL AB 2 0 TIES (SUTURE) ×2 IMPLANT
SUT VICRYL AB 3 0 TIES (SUTURE) IMPLANT
SYR CONTROL 10ML LL (SYRINGE) IMPLANT
TAPE CLOTH SURG 6X10 WHT LF (GAUZE/BANDAGES/DRESSINGS) ×1 IMPLANT
TOWEL OR 17X24 6PK STRL BLUE (TOWEL DISPOSABLE) ×2 IMPLANT
TOWEL OR 17X26 10 PK STRL BLUE (TOWEL DISPOSABLE) ×2 IMPLANT
TRAY FOLEY CATH 14FRSI W/METER (CATHETERS) ×1 IMPLANT

## 2014-08-28 NOTE — Anesthesia Procedure Notes (Signed)
Procedure Name: Intubation Date/Time: 08/28/2014 7:48 PM Performed by: Valetta Fuller Pre-anesthesia Checklist: Patient identified, Emergency Drugs available, Suction available and Patient being monitored Patient Re-evaluated:Patient Re-evaluated prior to inductionOxygen Delivery Method: Circle system utilized Preoxygenation: Pre-oxygenation with 100% oxygen Intubation Type: IV induction, Rapid sequence and Cricoid Pressure applied Laryngoscope Size: Miller and 3 Grade View: Grade II Tube size: 7.5 mm Number of attempts: 1 Airway Equipment and Method: Stylet Placement Confirmation: ETT inserted through vocal cords under direct vision,  positive ETCO2 and breath sounds checked- equal and bilateral Secured at: 24 cm Tube secured with: Tape Dental Injury: Teeth and Oropharynx as per pre-operative assessment

## 2014-08-28 NOTE — Anesthesia Preprocedure Evaluation (Addendum)
Anesthesia Evaluation  Patient identified by MRN, date of birth, ID band Patient awake    Reviewed: Allergy & Precautions, H&P , NPO status   Airway Mallampati: I TM Distance: >3 FB Neck ROM: Full    Dental  (+) Teeth Intact, Dental Advisory Given   Pulmonary          Cardiovascular hypertension, Pt. on home beta blockers     Neuro/Psych    GI/Hepatic Neg liver ROS, GI history noted. CE   Endo/Other  diabetes, Insulin Dependent  Renal/GU negative Renal ROS     Musculoskeletal   Abdominal   Peds  Hematology   Anesthesia Other Findings   Reproductive/Obstetrics                          Anesthesia Physical Anesthesia Plan  ASA: III and emergent  Anesthesia Plan: General   Post-op Pain Management:    Induction: Intravenous, Rapid sequence and Cricoid pressure planned  Airway Management Planned: Oral ETT  Additional Equipment:   Intra-op Plan:   Post-operative Plan: Extubation in OR  Informed Consent: I have reviewed the patients History and Physical, chart, labs and discussed the procedure including the risks, benefits and alternatives for the proposed anesthesia with the patient or authorized representative who has indicated his/her understanding and acceptance.   Dental advisory given  Plan Discussed with: CRNA and Anesthesiologist  Anesthesia Plan Comments:        Anesthesia Quick Evaluation

## 2014-08-28 NOTE — ED Notes (Signed)
General surg at bedside

## 2014-08-28 NOTE — Progress Notes (Signed)
CT shows incarcerated loop of small bowel and fat.  Will need to go to the OR.  Patient also more symptomatic.  To the OR for repair and reduction of hernia.  Vincent Cantu. [name], MD, [place] [phone] [facility]

## 2014-08-28 NOTE — ED Provider Notes (Signed)
CSN: [id number]     Arrival date & time 08/28/14  2440 History   First MD Initiated Contact with Patient 08/28/14 1003     Chief Complaint  Patient presents with  . 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  He reports that the hernia has been present for the past 6 months, but he is usually able to reduce it himself by pushing on it.  However, he states that this morning around 6-7 AM the hernia popped out and he was unable to push it back in.  He reports that the hernia usually is not painful, but today was painful.  He was seen at [facility] just prior to arrival and sent to the ED due to concern that he may have an incarcerated hernia.  He denies fever, chills, nausea, vomiting, diarrhea, or constipation.  He reports that his last BM was this morning and was normal.  He has not taken anything for pain prior to arrival.  He denies any abdominal pain aside from the site of the hernia.  Review of the chart shows that the patient had a ventralex mesh patch and repair surgery done by Dr. Hassell Done in May 1027 due to an umbilical hernia with incarceration of the omentum.  Patient is a 43 y.o. male presenting with abdominal pain. The history is provided by the patient.  Abdominal Pain   Past Medical History  Diagnosis Date  . Hyperlipidemia   . Hypertension   . Diabetes mellitus without complication 12/5364   Past Surgical History  Procedure Laterality Date  . Inguinal herniorrhapy    . Lumbar laminectomy    . Lumbar fusion    . Arthroscopic to knee for meniscus tear     Family History  Problem Relation Age of Onset  . Hypertension Other   . Seizures Other   . Diabetes Other     with renal dz  . Diabetes Mother   . Diabetes Maternal Grandmother    History  Substance Use  Topics  . Smoking status: Never Smoker   . Smokeless tobacco: Not on file  . Alcohol Use: No    Review of Systems  Gastrointestinal: Positive for abdominal pain.  All other systems reviewed and are negative.     Allergies  Review of patient's allergies indicates no known allergies.  Home Medications   Prior to Admission medications   Medication Sig Start Date End Date Taking? Authorizing Provider  amLODipine (NORVASC) 10 MG tablet Take 1 tablet (10 mg total) by mouth daily. 04/21/14   Denita Lung, MD  atorvastatin (LIPITOR) 20 MG tablet TAKE 1 TABLET DAILY 07/19/14   Camelia Eng Tysinger, PA-C  Insulin Detemir (LEVEMIR) 100 UNIT/ML Pen Inject 18 Units into the skin 2 (two) times daily. TO BE SENT TO [facility] [address] 07/19/14   Camelia Eng Tysinger, PA-C  losartan-hydrochlorothiazide (HYZAAR) 100-12.5 MG per tablet TAKE 1 TABLET BY MOUTH EVERY DAY 04/21/14   Denita Lung, MD  metFORMIN (GLUCOPHAGE) 1000 MG tablet Take 1 tablet (1,000 mg total) by mouth 2 (two) times daily with a meal. 07/19/14   Carlena Hurl, PA-C  Multiple Vitamins-Minerals (MULTIVITAMIN & MINERAL PO) Take by mouth.    Historical Provider, MD   BP 158/92  Pulse 73  Temp(Src) 98.7 F (37.1 C)  Resp 19  Ht 6\' 3"  (1.905 m)  Wt 297 lb (134.718 kg)  BMI 37.12 kg/m2  SpO2 98% Physical Exam  Nursing note and vitals reviewed. HENT:  Head: Normocephalic and atraumatic.  Mouth/Throat: Oropharynx is clear and moist.  Neck: Normal range of motion. Neck supple.  Cardiovascular: Normal rate, regular rhythm and normal heart sounds.   Pulmonary/Chest: Effort normal and breath sounds normal.  Abdominal: Bowel sounds are normal. There is no rebound and no guarding.    Aside from the hernia, the remainder of the abdomen is soft and non tender.  Musculoskeletal: Normal range of motion.  Neurological: He is alert.  Skin: Skin is warm and dry.  Psychiatric: He has a normal mood and affect.    ED Course  Procedures  (including critical care time) Labs Review Labs Reviewed  CBC WITH DIFFERENTIAL  COMPREHENSIVE METABOLIC PANEL  URINALYSIS, ROUTINE W REFLEX MICROSCOPIC  CBG MONITORING, ED    Imaging Review No results found.   EKG Interpretation None     10:13 AM Discussed patient with Dr. Wyvonnia Dusky.   10:52 AM Discussed with Claiborne Billings with General Surgery.  She reports that she will come see the patient in the ED. MDM   Final diagnoses:  None   Patient with a history of incarcerated umbilical hernia presents today with a painful hard hernia just to the right of the umbilicus.  Hernia has been present for the past 6 months, but is usually reducible.  This morning the hernia became painful and he was unable to reduce it.  On exam, he has a large hernia that is hard and tender.  Unable to reduce.  Concern for incarcerated hernia.  Labs today unremarkable.  No vomiting.  General Surgery consulted and admitted the patient.  CT scan ordered by General Surgery.    Hyman Bible, PA-C 08/28/14 1458

## 2014-08-28 NOTE — ED Provider Notes (Signed)
Vincent Cantu is a 43 y.o. male who presents to Urgent Care today for patient was in his usual state of health until this morning at 6:00 in the morning while walking the dog. He felt a bulging sensation in his abdomen. Since then he's felt a firm hard tender mass near his umbilicus. He has a history of ventral hernia with repair and is concerned that he may have a second hernia. No fevers or chills nausea vomiting or diarrhea. He last ate yesterday evening.   Past Medical History  Diagnosis Date  . Hyperlipidemia   . Hypertension   . Diabetes mellitus without complication 12/8411   History  Substance Use Topics  . Smoking status: Never Smoker   . Smokeless tobacco: Not on file  . Alcohol Use: No   ROS as above Medications: No current facility-administered medications for this encounter.   Current Outpatient Prescriptions  Medication Sig Dispense Refill  . amLODipine (NORVASC) 10 MG tablet Take 1 tablet (10 mg total) by mouth daily.  90 tablet  3  . atorvastatin (LIPITOR) 20 MG tablet TAKE 1 TABLET DAILY  90 tablet  0  . Insulin Detemir (LEVEMIR) 100 UNIT/ML Pen Inject 18 Units into the skin 2 (two) times daily. [facility] [address]      . losartan-hydrochlorothiazide (HYZAAR) 100-12.5 MG per tablet TAKE 1 TABLET BY MOUTH EVERY DAY  90 tablet  3  . metFORMIN (GLUCOPHAGE) 1000 MG tablet Take 1 tablet (1,000 mg total) by mouth 2 (two) times daily with a meal.  180 tablet  0  . metoprolol succinate (TOPROL-XL) 100 MG 24 hr tablet TAKE 1 TABLET (100 MG TOTAL) BY MOUTH DAILY. TAKE WITH OR IMMEDIATELY FOLLOWING A MEAL.  90 tablet  3  . Multiple Vitamins-Minerals (MULTIVITAMIN & MINERAL PO) Take by mouth.        Exam:  BP 153/105  Pulse 78  Temp(Src) 97.9 F (36.6 C) (Oral)  Resp 16  SpO2 99% Gen: Well NAD HEENT: EOMI,  MMM Lungs: Normal work of breathing. CTABL Heart: RRR no MRG Abd: Hypoactive bowel sounds. Firm tender mass to the right of the umbilicus. Mass is  approximately 5-6 cm in diameter and is very tender. Nonreducible. Exts: Brisk capillary refill, warm and well perfused.   No results found for this or any previous visit (from the past 24 hour(s)). No results found.  Assessment and Plan: 43 y.o. male with incarcerated ventral hernia. Transfer to emergency department for evaluation and management. His wife will drive him.  Discussed warning signs or symptoms. Please see discharge instructions. Patient expresses understanding.     Gregor Hams, MD 08/28/14 [phone]

## 2014-08-28 NOTE — ED Provider Notes (Signed)
Medical screening examination/treatment/procedure(s) were conducted as a shared visit with non-physician practitioner(s) and myself.  I personally evaluated the patient during the encounter.  Incarcerated umbilical hernia.  Unable to reduce.  Firm and tender.   Surgery consult.    EKG Interpretation None       Ezequiel Essex, MD 08/28/14 1805

## 2014-08-28 NOTE — ED Notes (Signed)
Pt. Stated, I was taking out the dog and the dog was pulling, came back in and laid down and my hernia started protruding and in pain

## 2014-08-28 NOTE — Transfer of Care (Signed)
Immediate Anesthesia Transfer of Care Note  Patient: Vincent Cantu  Procedure(s) Performed: Procedure(s): HERNIA REPAIR VENTRAL ADULT (N/A)  Patient Location: PACU  Anesthesia Type:General  Level of Consciousness: awake, alert  and oriented  Airway & Oxygen Therapy: Patient connected to nasal cannula oxygen  Post-op Assessment: Report given to PACU RN and Post -op Vital signs reviewed and stable  Post vital signs: Reviewed and stable  Complications: No apparent anesthesia complications

## 2014-08-28 NOTE — Anesthesia Postprocedure Evaluation (Signed)
  Anesthesia Post-op Note  Patient: Vincent Cantu  Procedure(s) Performed: Procedure(s): HERNIA REPAIR VENTRAL ADULT (N/A)  Patient Location: PACU  Anesthesia Type:General  Level of Consciousness: awake  Airway and Oxygen Therapy: Patient Spontanous Breathing  Post-op Pain: mild  Post-op Assessment: Post-op Vital signs reviewed  Post-op Vital Signs: Reviewed  Last Vitals:  Filed Vitals:   08/28/14 2226  BP: 145/90  Pulse: 66  Temp:   Resp: 9    Complications: No apparent anesthesia complications

## 2014-08-28 NOTE — Op Note (Signed)
OPERATIVE REPORT  DATE OF OPERATION: 08/28/2014  PATIENT:  Vincent Cantu  43 y.o. male  PRE-OPERATIVE DIAGNOSIS:  Incarcerated recurrent ventral hernia  POST-OPERATIVE DIAGNOSIS:  Incarcerated recurrent ventral hernia  PROCEDURE:  Procedure(s): HERNIA REPAIR VENTRAL ADULT  SURGEON:  Surgeon(s): Doreen Salvage, MD Leighton Ruff, MD  ASSISTANT: A. Marcello Moores, M.D.  ANESTHESIA:   general  EBL: 100 ml  BLOOD ADMINISTERED: none  DRAINS: (48mm) Jackson-Pratt drain(s) with closed bulb suction in the abdominal wound and Urinary Catheter (Foley)   SPECIMEN:  No Specimen  COUNTS CORRECT:  YES  PROCEDURE DETAILS: The patient was taken to the operating room and placed on the table in the supine position. After an adequate general endotracheal anesthetic was administered he was prepped and draped in the usual sterile manner exposing his entire abdomen.  After a proper timeout was performed identifying the patient and the procedure to be performed, a transverse incision approximately 10 cm long was made above the umbilicus down to subcutaneous tissue. The majority of the procedure was identifying the hernia sacs, dissecting out the hernia sacs from the surrounding subcutaneous tissue, and identifying the multiple fascial defects. The largest hernia sac was to the right of the umbilicus and superior. It included a loop of small bowel which is approximately 6-8 inches long. This loop of small bowel was ischemic but not infarcted and quickly pinked up once we were able to release it from its entrapment position in the small hernia defect.  This small bowel loop was freed up by identifying the hernia sac, opening the hernia sac, and then releasing the fascia so that the bowel could be reduced back into the peritoneal cavity. It quickly pinked up and became viable. There were multiple fascial defects ranging from 3-4 cm in size to 1-2 centimeters in size which directly connected with the hernia sacs that  were in the subcutaneous tissue. The previously placed mesh had been lifted off the fashion of amylose overlying a defect that was at the umbilicus. We connected all the hernia defects creating one large central hernia defect measuring approximately 5 x 10 cm in size. This included the one at the umbilicus. All the hernia sacs were released and removed. Kelly clamps and 2-0 Vicryl ties are used to tie off and secured bleeding control of the hernia defect. Once we have complete control we were able to create a flap circumferentially around the hernia defect which were used to repair the hernia. Interrupted #1 Novafil sutures were placed for the primary repair. We subsequently ran a #1 Novafil from the left and the right and tied onto itself on top of the interrupted repair. We irrigated the subcutaneous tissue with saline and closed in 2 layers of 3-0 Vicryl and scan with stainless steel staples on the skin. A #10 flat Jackson-Pratt drain was placed in the deep recess of the wound and brought out the right lateral aspect of the wound and sutured in place with a 3-0 nylon suture.  All needle counts, sponge counts, and instrument counts were correct.  PATIENT DISPOSITION:  PACU - hemodynamically stable.   Jaman Aro, JAY 10/11/201510:15 PM

## 2014-08-28 NOTE — H&P (Signed)
Will get CT scan of the abdomen to see if bowel incarceration.  Kathryne Eriksson. [name], MD, [place] [phone] [facility]

## 2014-08-28 NOTE — ED Notes (Signed)
Hx of prior hernia surgery.   C/o abdominal swelling this a.m.  Mild nausea and diarrhea.  No fever.

## 2014-08-28 NOTE — ED Notes (Addendum)
Floor requesting that pt go to ct prior to transport to floor. Ct states that it has to be an hour wait after drinking final cup. Will inform RN on floor.

## 2014-08-28 NOTE — H&P (Signed)
Vincent Cantu [birth year]  [id number].   Primary Care MD: Jill Alexanders Chief Complaint/Reason for Consult: incarcerated umbilical hernia HPI: this is a pleasant 43 yo black male who has a history of 2 umbilical hernia repairs in the past, both in 2007 by Dr. Kaylyn Lim.  The patient states his umbilical hernia has returned for a while.  It usually reduces in and is no problem.  This morning he was walking his dog around 0600 and noticed when he came back in that the hernia was stuck out and he could not get it to go back in.  He was having pain at that time.  He presented to [facility] where they were unable to reduce it.  We have been asked to see him for further recommendations.  ROS: Please see HPI, otherwise negative.  No N/V/D  Had BM this am.  Family History  Problem Relation Age of Onset  . Hypertension Other   . Seizures Other   . Diabetes Other     with renal dz  . Diabetes Mother   . Diabetes Maternal Grandmother     Past Medical History  Diagnosis Date  . Hyperlipidemia   . Hypertension   . Diabetes mellitus without complication 04/9628    Past Surgical History  Procedure Laterality Date  . Lumbar laminectomy    . Lumbar fusion    . Arthroscopic to knee for meniscus tear    . Umbilical hernia repair with mesh x 2      Social History:  reports that he has never smoked. He does not have any smokeless tobacco history on file. He reports that he does not drink alcohol or use illicit drugs.  Allergies: No Known Allergies   (Not in a hospital admission)  Blood pressure 144/77, pulse 67, temperature 98.7 F (37.1 C), temperature source Rectal, resp. rate 19, height '6\' 3"'  (1.905 m), weight 297 lb (134.718 kg), SpO2 97.00%. Physical Exam: General: pleasant, WD, WN black male who is laying in bed in NAD HEENT: head is normocephalic, atraumatic.  Sclera are noninjected.  PERRL.  Ears and nose without any masses or lesions.  Mouth is pink and moist Heart: regular, rate, and  rhythm.  Normal s1,s2. No obvious murmurs, gallops, or rubs noted.  Palpable radial and pedal pulses bilaterally Lungs: CTAB, no wheezes, rhonchi, or rales noted.  Respiratory effort nonlabored Abd: soft, NT, ND, +BS,  He does have an umbilical hernia just right lateral to his umbilicus.  This is not tender right now (had pain meds)  This is unable to be reduced.  It is hard.  no masses, or organomegaly MS: all 4 extremities are symmetrical with no cyanosis, clubbing, or edema. Skin: warm and dry with no masses, lesions, or rashes Psych: A&Ox3 with an appropriate affect.    Results for orders placed during the hospital encounter of 08/28/14 (from the past 48 hour(s))  CBG MONITORING, ED     Status: None   Collection Time    08/28/14 10:30 AM      Result Value Ref Range   Glucose-Capillary 91  70 - 99 mg/dL  CBC WITH DIFFERENTIAL     Status: Abnormal   Collection Time    08/28/14 10:35 AM      Result Value Ref Range   WBC 5.3  4.0 - 10.5 K/uL   RBC 4.58  4.22 - 5.81 MIL/uL   Hemoglobin 12.0 (*) 13.0 - 17.0 g/dL   HCT 37.7 (*) 39.0 - 52.0 %  MCV 82.3  78.0 - 100.0 fL   MCH 26.2  26.0 - 34.0 pg   MCHC 31.8  30.0 - 36.0 g/dL   RDW 13.3  11.5 - 15.5 %   Platelets 209  150 - 400 K/uL   Neutrophils Relative % 64  43 - 77 %   Neutro Abs 3.4  1.7 - 7.7 K/uL   Lymphocytes Relative 24  12 - 46 %   Lymphs Abs 1.3  0.7 - 4.0 K/uL   Monocytes Relative 10  3 - 12 %   Monocytes Absolute 0.5  0.1 - 1.0 K/uL   Eosinophils Relative 1  0 - 5 %   Eosinophils Absolute 0.0  0.0 - 0.7 K/uL   Basophils Relative 1  0 - 1 %   Basophils Absolute 0.0  0.0 - 0.1 K/uL  COMPREHENSIVE METABOLIC PANEL     Status: Abnormal   Collection Time    08/28/14 10:35 AM      Result Value Ref Range   Sodium 140  137 - 147 mEq/L   Potassium 3.5 (*) 3.7 - 5.3 mEq/L   Chloride 104  96 - 112 mEq/L   CO2 23  19 - 32 mEq/L   Glucose, Bld 101 (*) 70 - 99 mg/dL   BUN 14  6 - 23 mg/dL   Creatinine, Ser 1.13  0.50 - 1.35  mg/dL   Calcium 8.9  8.4 - 10.5 mg/dL   Total Protein 7.8  6.0 - 8.3 g/dL   Albumin 3.8  3.5 - 5.2 g/dL   AST 18  0 - 37 U/L   ALT 11  0 - 53 U/L   Alkaline Phosphatase 86  39 - 117 U/L   Total Bilirubin 0.4  0.3 - 1.2 mg/dL   GFR calc non Af Amer 77 (*) >90 mL/min   GFR calc Af Amer 89 (*) >90 mL/min   Comment: (NOTE)     The eGFR has been calculated using the CKD EPI equation.     This calculation has not been validated in all clinical situations.     eGFR's persistently <90 mL/min signify possible Chronic Kidney     Disease.   Anion gap 13  5 - 15  URINALYSIS, ROUTINE W REFLEX MICROSCOPIC     Status: None   Collection Time    08/28/14 11:22 AM      Result Value Ref Range   Color, Urine YELLOW  YELLOW   APPearance CLEAR  CLEAR   Specific Gravity, Urine 1.009  1.005 - 1.030   pH 7.0  5.0 - 8.0   Glucose, UA NEGATIVE  NEGATIVE mg/dL   Hgb urine dipstick NEGATIVE  NEGATIVE   Bilirubin Urine NEGATIVE  NEGATIVE   Ketones, ur NEGATIVE  NEGATIVE mg/dL   Protein, ur NEGATIVE  NEGATIVE mg/dL   Urobilinogen, UA 1.0  0.0 - 1.0 mg/dL   Nitrite NEGATIVE  NEGATIVE   Leukocytes, UA NEGATIVE  NEGATIVE   Comment: MICROSCOPIC NOT DONE ON URINES WITH NEGATIVE PROTEIN, BLOOD, LEUKOCYTES, NITRITE, OR GLUCOSE <1000 mg/dL.   No results found.     Assessment/Plan 1. Recurrent incarcerated umbilical hernia 2. DM 3. HTN  Plan: 1. We will order a CT scan to help determine whether there is fat or bowel that is incarcerated.  He will remain NPO for the possibility of a hernia repair today in the OR.   2. I have placed him on a SSI while he is NPO.  I have held  his DM meds right now.  He will continue his HTN meds with a sip of water.  Kc Sedlak E 08/28/2014, 1:16 PM Pager: [phone]

## 2014-08-29 ENCOUNTER — Encounter (HOSPITAL_COMMUNITY): Payer: Self-pay | Admitting: General Surgery

## 2014-08-29 DIAGNOSIS — Y93K1 Activity, walking an animal: Secondary | ICD-10-CM | POA: Diagnosis not present

## 2014-08-29 DIAGNOSIS — E785 Hyperlipidemia, unspecified: Secondary | ICD-10-CM | POA: Diagnosis present

## 2014-08-29 DIAGNOSIS — E119 Type 2 diabetes mellitus without complications: Secondary | ICD-10-CM | POA: Diagnosis present

## 2014-08-29 DIAGNOSIS — Z833 Family history of diabetes mellitus: Secondary | ICD-10-CM | POA: Diagnosis not present

## 2014-08-29 DIAGNOSIS — K420 Umbilical hernia with obstruction, without gangrene: Secondary | ICD-10-CM | POA: Diagnosis present

## 2014-08-29 DIAGNOSIS — I10 Essential (primary) hypertension: Secondary | ICD-10-CM | POA: Diagnosis present

## 2014-08-29 DIAGNOSIS — K430 Incisional hernia with obstruction, without gangrene: Secondary | ICD-10-CM | POA: Diagnosis present

## 2014-08-29 LAB — GLUCOSE, CAPILLARY
GLUCOSE-CAPILLARY: 107 mg/dL — AB (ref 70–99)
GLUCOSE-CAPILLARY: 108 mg/dL — AB (ref 70–99)
Glucose-Capillary: 116 mg/dL — ABNORMAL HIGH (ref 70–99)
Glucose-Capillary: 140 mg/dL — ABNORMAL HIGH (ref 70–99)
Glucose-Capillary: 109 mg/dL — ABNORMAL HIGH (ref 70–99)

## 2014-08-29 LAB — BASIC METABOLIC PANEL
Anion gap: 13 (ref 5–15)
BUN: 9 mg/dL (ref 6–23)
CO2: 23 mEq/L (ref 19–32)
Calcium: 8.7 mg/dL (ref 8.4–10.5)
Chloride: 102 mEq/L (ref 96–112)
Creatinine, Ser: 1.09 mg/dL (ref 0.50–1.35)
GFR, EST NON AFRICAN AMERICAN: 80 mL/min — AB (ref >90)
Glucose, Bld: 112 mg/dL — ABNORMAL HIGH (ref 70–99)
POTASSIUM: 4.1 meq/L (ref 3.7–5.3)
Sodium: 138 mEq/L (ref 137–147)

## 2014-08-29 LAB — CBC WITH DIFFERENTIAL/PLATELET
BASOS PCT: 0 % (ref 0–1)
Basophils Absolute: 0 10*3/uL (ref 0.0–0.1)
Eosinophils Absolute: 0 10*3/uL (ref 0.0–0.7)
Eosinophils Relative: 0 % (ref 0–5)
HCT: 36.8 % — ABNORMAL LOW (ref 39.0–52.0)
Hemoglobin: 11.8 g/dL — ABNORMAL LOW (ref 13.0–17.0)
Lymphocytes Relative: 14 % (ref 12–46)
Lymphs Abs: 1.3 10*3/uL (ref 0.7–4.0)
MCH: 26.3 pg (ref 26.0–34.0)
MCHC: 32.1 g/dL (ref 30.0–36.0)
MCV: 82.1 fL (ref 78.0–100.0)
Monocytes Absolute: 0.9 10*3/uL (ref 0.1–1.0)
Monocytes Relative: 10 % (ref 3–12)
NEUTROS PCT: 76 % (ref 43–77)
Neutro Abs: 7.4 10*3/uL (ref 1.7–7.7)
PLATELETS: 205 10*3/uL (ref 150–400)
RBC: 4.48 MIL/uL (ref 4.22–5.81)
RDW: 13.2 % (ref 11.5–15.5)
WBC: 9.6 10*3/uL (ref 4.0–10.5)

## 2014-08-29 MED ORDER — CHLORHEXIDINE GLUCONATE 0.12 % MT SOLN
15.0000 mL | Freq: Two times a day (BID) | OROMUCOSAL | Status: DC
  Administered 2014-08-29 – 2014-09-01 (×5): 15 mL via OROMUCOSAL
  Filled 2014-08-29 (×5): qty 15

## 2014-08-29 MED ORDER — DEXTROSE-NACL 5-0.9 % IV SOLN
INTRAVENOUS | Status: DC
  Administered 2014-08-29 – 2014-08-30 (×3): via INTRAVENOUS

## 2014-08-29 MED ORDER — CETYLPYRIDINIUM CHLORIDE 0.05 % MT LIQD
7.0000 mL | Freq: Two times a day (BID) | OROMUCOSAL | Status: DC
  Administered 2014-08-29 – 2014-08-31 (×3): 7 mL via OROMUCOSAL

## 2014-08-29 MED ORDER — PNEUMOCOCCAL VAC POLYVALENT 25 MCG/0.5ML IJ INJ
0.5000 mL | INJECTION | INTRAMUSCULAR | Status: AC
  Administered 2014-08-31: 0.5 mL via INTRAMUSCULAR
  Filled 2014-08-29 (×2): qty 0.5

## 2014-08-29 NOTE — Progress Notes (Signed)
Gen. Surgery attending:  Patient interviewed and examined. Agree with above.  He is alert and looks good. His abdomen is soft and nondistended. Will allow clear liquids. Encourage ambulation. Continue SSI for insulin-dependent diabetes.   Vincent Cantu. Dalbert Batman, M.D., [facility] General and Minimally invasive Surgery Breast and Colorectal Surgery Office:   [phone]

## 2014-08-29 NOTE — Progress Notes (Signed)
Patient ID: Vincent Cantu, male   DOB: 09/15/1971, 43 y.o.   MRN: [id number]     [address] [place], [place] [address]    Phone: [phone] FAX: [phone]     Subjective: No flatus.  No n/v.  VSS.  Afebrile.  Has not been OOB.  Adequate UOP.  Objective:  Vital signs:  Filed Vitals:   08/28/14 2312 08/28/14 2328 08/29/14 0242 08/29/14 0638  BP: 145/85 136/87 137/81 140/85  Pulse: 80 71 71 84  Temp: 97.8 F (36.6 C) 98.7 F (37.1 C) 98.3 F (36.8 C) 99.1 F (37.3 C)  TempSrc:  Oral Oral Oral  Resp: '21 20 20 20  ' Height:      Weight:      SpO2: 97% 96% 97% 95%    Last BM Date: 08/28/14  Intake/Output   Yesterday:  10/11 0701 - 10/12 0700 In: 2785 [I.V.:2785] Out: 1225 [Urine:1195; Drains:30] This shift: I/O last 3 completed shifts: In: 2785 [I.V.:2785] Out: 1225 [Urine:1195; Drains:30]    Physical Exam: General: Pt awake/alert/oriented x4 in no acute distress Chest: cta.  No chest wall pain w good excursion CV:  Pulses intact.  Regular rhythm MS: Normal AROM mjr joints.  No obvious deformity Abdomen: Soft.  Nondistended.  Appropriately tender.  Hypoactive bowel sounds.  Dressing is dry and intact, abd binder in place.  No evidence of peritonitis.  No incarcerated hernias. Ext:  SCDs BLE.  No mjr edema.  No cyanosis Skin: No petechiae / purpura   Problem List:   Principal Problem:   Incarcerated umbilical hernia    Results:   Labs: Results for orders placed during the hospital encounter of 08/28/14 (from the past 48 hour(s))  CBG MONITORING, ED     Status: None   Collection Time    08/28/14 10:30 AM      Result Value Ref Range   Glucose-Capillary 91  70 - 99 mg/dL  CBC WITH DIFFERENTIAL     Status: Abnormal   Collection Time    08/28/14 10:35 AM      Result Value Ref Range   WBC 5.3  4.0 - 10.5 K/uL   RBC 4.58  4.22 - 5.81 MIL/uL   Hemoglobin 12.0 (*) 13.0 - 17.0 g/dL   HCT  37.7 (*) 39.0 - 52.0 %   MCV 82.3  78.0 - 100.0 fL   MCH 26.2  26.0 - 34.0 pg   MCHC 31.8  30.0 - 36.0 g/dL   RDW 13.3  11.5 - 15.5 %   Platelets 209  150 - 400 K/uL   Neutrophils Relative % 64  43 - 77 %   Neutro Abs 3.4  1.7 - 7.7 K/uL   Lymphocytes Relative 24  12 - 46 %   Lymphs Abs 1.3  0.7 - 4.0 K/uL   Monocytes Relative 10  3 - 12 %   Monocytes Absolute 0.5  0.1 - 1.0 K/uL   Eosinophils Relative 1  0 - 5 %   Eosinophils Absolute 0.0  0.0 - 0.7 K/uL   Basophils Relative 1  0 - 1 %   Basophils Absolute 0.0  0.0 - 0.1 K/uL  COMPREHENSIVE METABOLIC PANEL     Status: Abnormal   Collection Time    08/28/14 10:35 AM      Result Value Ref Range   Sodium 140  137 - 147 mEq/L   Potassium 3.5 (*) 3.7 -  5.3 mEq/L   Chloride 104  96 - 112 mEq/L   CO2 23  19 - 32 mEq/L   Glucose, Bld 101 (*) 70 - 99 mg/dL   BUN 14  6 - 23 mg/dL   Creatinine, Ser 1.13  0.50 - 1.35 mg/dL   Calcium 8.9  8.4 - 10.5 mg/dL   Total Protein 7.8  6.0 - 8.3 g/dL   Albumin 3.8  3.5 - 5.2 g/dL   AST 18  0 - 37 U/L   ALT 11  0 - 53 U/L   Alkaline Phosphatase 86  39 - 117 U/L   Total Bilirubin 0.4  0.3 - 1.2 mg/dL   GFR calc non Af Amer 77 (*) >90 mL/min   GFR calc Af Amer 89 (*) >90 mL/min   Comment: (NOTE)     The eGFR has been calculated using the CKD EPI equation.     This calculation has not been validated in all clinical situations.     eGFR's persistently <90 mL/min signify possible Chronic Kidney     Disease.   Anion gap 13  5 - 15  URINALYSIS, ROUTINE W REFLEX MICROSCOPIC     Status: None   Collection Time    08/28/14 11:22 AM      Result Value Ref Range   Color, Urine YELLOW  YELLOW   APPearance CLEAR  CLEAR   Specific Gravity, Urine 1.009  1.005 - 1.030   pH 7.0  5.0 - 8.0   Glucose, UA NEGATIVE  NEGATIVE mg/dL   Hgb urine dipstick NEGATIVE  NEGATIVE   Bilirubin Urine NEGATIVE  NEGATIVE   Ketones, ur NEGATIVE  NEGATIVE mg/dL   Protein, ur NEGATIVE  NEGATIVE mg/dL   Urobilinogen, UA 1.0   0.0 - 1.0 mg/dL   Nitrite NEGATIVE  NEGATIVE   Leukocytes, UA NEGATIVE  NEGATIVE   Comment: MICROSCOPIC NOT DONE ON URINES WITH NEGATIVE PROTEIN, BLOOD, LEUKOCYTES, NITRITE, OR GLUCOSE <1000 mg/dL.  GLUCOSE, CAPILLARY     Status: None   Collection Time    08/28/14  4:35 PM      Result Value Ref Range   Glucose-Capillary 84  70 - 99 mg/dL  SURGICAL PCR SCREEN     Status: None   Collection Time    08/28/14  5:39 PM      Result Value Ref Range   MRSA, PCR NEGATIVE  NEGATIVE   Staphylococcus aureus NEGATIVE  NEGATIVE   Comment:            The Xpert SA Assay (FDA     approved for NASAL specimens     in patients over 64 years of age),     is one component of     a comprehensive surveillance     program.  Test performance has     been validated by [employer] for patients greater     than or equal to 27 year old.     It is not intended     to diagnose infection nor to     guide or monitor treatment.  GLUCOSE, CAPILLARY     Status: Abnormal   Collection Time    08/28/14 10:17 PM      Result Value Ref Range   Glucose-Capillary 106 (*) 70 - 99 mg/dL   Comment 1 Notify RN    CBC WITH DIFFERENTIAL     Status: Abnormal   Collection Time    08/29/14  5:43 AM  Result Value Ref Range   WBC 9.6  4.0 - 10.5 K/uL   RBC 4.48  4.22 - 5.81 MIL/uL   Hemoglobin 11.8 (*) 13.0 - 17.0 g/dL   HCT 36.8 (*) 39.0 - 52.0 %   MCV 82.1  78.0 - 100.0 fL   MCH 26.3  26.0 - 34.0 pg   MCHC 32.1  30.0 - 36.0 g/dL   RDW 13.2  11.5 - 15.5 %   Platelets 205  150 - 400 K/uL   Neutrophils Relative % 76  43 - 77 %   Neutro Abs 7.4  1.7 - 7.7 K/uL   Lymphocytes Relative 14  12 - 46 %   Lymphs Abs 1.3  0.7 - 4.0 K/uL   Monocytes Relative 10  3 - 12 %   Monocytes Absolute 0.9  0.1 - 1.0 K/uL   Eosinophils Relative 0  0 - 5 %   Eosinophils Absolute 0.0  0.0 - 0.7 K/uL   Basophils Relative 0  0 - 1 %   Basophils Absolute 0.0  0.0 - 0.1 K/uL  BASIC METABOLIC PANEL     Status: Abnormal   Collection  Time    08/29/14  5:43 AM      Result Value Ref Range   Sodium 138  137 - 147 mEq/L   Potassium 4.1  3.7 - 5.3 mEq/L   Chloride 102  96 - 112 mEq/L   CO2 23  19 - 32 mEq/L   Glucose, Bld 112 (*) 70 - 99 mg/dL   BUN 9  6 - 23 mg/dL   Creatinine, Ser 1.09  0.50 - 1.35 mg/dL   Calcium 8.7  8.4 - 10.5 mg/dL   GFR calc non Af Amer 80 (*) >90 mL/min   GFR calc Af Amer >90  >90 mL/min   Comment: (NOTE)     The eGFR has been calculated using the CKD EPI equation.     This calculation has not been validated in all clinical situations.     eGFR's persistently <90 mL/min signify possible Chronic Kidney     Disease.   Anion gap 13  5 - 15  GLUCOSE, CAPILLARY     Status: Abnormal   Collection Time    08/29/14  6:50 AM      Result Value Ref Range   Glucose-Capillary 116 (*) 70 - 99 mg/dL    Imaging / Studies: Ct Abdomen Pelvis Wo Contrast  08/28/2014   CLINICAL DATA:  History of hernia repair. Abdominal pain. Evaluate for possible incarcerated hernia/bowel.  EXAM: CT ABDOMEN AND PELVIS WITHOUT CONTRAST  TECHNIQUE: Multidetector CT imaging of the abdomen and pelvis was performed following the standard protocol without IV contrast.  COMPARISON:  05/30/2006  FINDINGS: Lung bases are clear. No effusions. Heart is normal size.  Liver, gallbladder, spleen, pancreas, adrenals and kidneys are normal.  Large complex ventral hernias. Two closely oriented defects are noted through the anterior abdominal wall on image 50 to the right ventral hernia measures approximately 9.5 cm in diameter. The left ventral hernia measures 5.6 cm. The right ventral hernia contains a small bowel loop. Small bowel proximal to this hernia is mildly dilated. There is mild stranding around the herniated small bowel loop in the hernia sac. Findings concerning for incarcerated hernia with early bowel obstruction. No bowel within the left ventral hernia, which contains fat.  Stomach, large bowel unremarkable. No free fluid, free air or  adenopathy. Left inguinal hernia again noted, stable since prior study.  Aorta is normal caliber.  No acute bony abnormality. Degenerative changes in the lower lumbar spine.  IMPRESSION: Two adjacent ventral wall hernias. The right periumbilical ventral hernia contains a small bowel loop with possible incarceration and early small bowel obstruction. The left periumbilical ventral wall hernia contains fat.  Left inguinal hernia with stable appearance since 2007.   Electronically Signed   By: Rolm Baptise M.D.   On: 08/28/2014 16:44    Medications / Allergies:  Scheduled Meds: . amLODipine  10 mg Oral Daily  . antiseptic oral rinse  7 mL Mouth Rinse q12n4p  . chlorhexidine  15 mL Mouth Rinse BID  . enoxaparin (LOVENOX) injection  40 mg Subcutaneous Q24H  . hydrochlorothiazide  12.5 mg Oral Daily  . HYDROmorphone      . HYDROmorphone      . insulin aspart  0-9 Units Subcutaneous TID WC  . losartan  100 mg Oral Daily  . metoprolol succinate  100 mg Oral Daily   Continuous Infusions: . dextrose 5 % and 0.9% NaCl     PRN Meds:.acetaminophen, acetaminophen, diphenhydrAMINE, diphenhydrAMINE, morphine injection, ondansetron  Antibiotics: Anti-infectives   Start     Dose/Rate Route Frequency Ordered Stop   08/29/14 0600  [MAR Hold]  ceFAZolin (ANCEF) IVPB 2 g/50 mL premix     (On MAR Hold since 08/28/14 1907)   2 g 100 mL/hr over 30 Minutes Intravenous On call to O.R. 08/28/14 1723 08/28/14 1940        Assessment/Plan POD#1 ventral hernia repair-Dr. Hulen Skains -ice chips, await bowel function -DC foley -mobilize -IS(1568m) -change IVF to D5NS -pain control -SCD/lovenox -drain care -abdominal binder DM II -change IVF -hold levemir until tolerating PO.  Hold metformin until DC -SSI, CBGs HTN -continue home meds   EErby Pian [facility] Pager [phone](7A-4:30P) For consults and floor pages call [phone](7A-4:30P)  08/29/2014 8:07 AM

## 2014-08-30 LAB — GLUCOSE, CAPILLARY
GLUCOSE-CAPILLARY: 106 mg/dL — AB (ref 70–99)
GLUCOSE-CAPILLARY: 103 mg/dL — AB (ref 70–99)
Glucose-Capillary: 98 mg/dL (ref 70–99)
Glucose-Capillary: 107 mg/dL — ABNORMAL HIGH (ref 70–99)

## 2014-08-30 MED ORDER — OXYCODONE-ACETAMINOPHEN 5-325 MG PO TABS
1.0000 | ORAL_TABLET | ORAL | Status: DC | PRN
  Administered 2014-08-30 – 2014-09-01 (×4): 2 via ORAL
  Filled 2014-08-30 (×4): qty 2

## 2014-08-30 MED ORDER — POVIDONE-IODINE 10 % EX OINT
TOPICAL_OINTMENT | CUTANEOUS | Status: DC | PRN
  Filled 2014-08-30: qty 28.35

## 2014-08-30 NOTE — Progress Notes (Signed)
[facility] Progress Note 2 Days Post-Op  Subjective: Patient is doing extremely well.  Minimal abdominal pain.    Objective: Vital signs in last 24 hours: Temp:  [97.9 F (36.6 C)-99.6 F (37.6 C)] 98.1 F (36.7 C) (10/13 0514) Pulse Rate:  [62-91] 62 (10/13 0514) Resp:  [17-19] 18 (10/13 0514) BP: (121-147)/(79-96) 121/79 mmHg (10/13 0514) SpO2:  [93 %-100 %] 93 % (10/13 0514) Last BM Date: 08/28/14  Intake/Output from previous day: 10/12 0701 - 10/13 0700 In: 2427 [P.O.:360; I.V.:2067] Out: 2025 [Urine:1950; Drains:75] Intake/Output this shift:    General: No acute distress  Lungs: Clear  Abd: Good bowel sounds.  No BM or flatus.  Wound looks excellent.  No recurrence of hernia  Extremities: No clinical signs or symptoms of DVT  Neuro: Intact  Lab Results:  @LABLAST2 (wbc:2,hgb:2,hct:2,plt:2) BMET  Recent Labs  08/28/14 1035 08/29/14 0543  NA 140 138  K 3.5* 4.1  CL 104 102  CO2 23 23  GLUCOSE 101* 112*  BUN 14 9  CREATININE 1.13 1.09  CALCIUM 8.9 8.7   PT/INR No results found for this basename: LABPROT, INR,  in the last 72 hours ABG No results found for this basename: PHART, PCO2, PO2, HCO3,  in the last 72 hours  Studies/Results: Ct Abdomen Pelvis Wo Contrast  08/28/2014   CLINICAL DATA:  History of hernia repair. Abdominal pain. Evaluate for possible incarcerated hernia/bowel.  EXAM: CT ABDOMEN AND PELVIS WITHOUT CONTRAST  TECHNIQUE: Multidetector CT imaging of the abdomen and pelvis was performed following the standard protocol without IV contrast.  COMPARISON:  05/30/2006  FINDINGS: Lung bases are clear. No effusions. Heart is normal size.  Liver, gallbladder, spleen, pancreas, adrenals and kidneys are normal.  Large complex ventral hernias. Two closely oriented defects are noted through the anterior abdominal wall on image 50 to the right ventral hernia measures approximately 9.5 cm in diameter. The left ventral hernia measures 5.6 cm. The right  ventral hernia contains a small bowel loop. Small bowel proximal to this hernia is mildly dilated. There is mild stranding around the herniated small bowel loop in the hernia sac. Findings concerning for incarcerated hernia with early bowel obstruction. No bowel within the left ventral hernia, which contains fat.  Stomach, large bowel unremarkable. No free fluid, free air or adenopathy. Left inguinal hernia again noted, stable since prior study. Aorta is normal caliber.  No acute bony abnormality. Degenerative changes in the lower lumbar spine.  IMPRESSION: Two adjacent ventral wall hernias. The right periumbilical ventral hernia contains a small bowel loop with possible incarceration and early small bowel obstruction. The left periumbilical ventral wall hernia contains fat.  Left inguinal hernia with stable appearance since 2007.   Electronically Signed   By: Rolm Baptise M.D.   On: 08/28/2014 16:44    Anti-infectives: Anti-infectives   Start     Dose/Rate Route Frequency Ordered Stop   08/29/14 0600  [MAR Hold]  ceFAZolin (ANCEF) IVPB 2 g/50 mL premix     (On MAR Hold since 08/28/14 1907)   2 g 100 mL/hr over 30 Minutes Intravenous On call to O.R. 08/28/14 1723 08/28/14 1940      Assessment/Plan: s/p Procedure(s): HERNIA REPAIR VENTRAL ADULT Advance diet Decrease IVFs.  LOS: 2 days   Kathryne Eriksson. [name], MD, FACS [phone] [facility] 08/30/2014

## 2014-08-31 LAB — GLUCOSE, CAPILLARY
GLUCOSE-CAPILLARY: 104 mg/dL — AB (ref 70–99)
GLUCOSE-CAPILLARY: 118 mg/dL — AB (ref 70–99)
Glucose-Capillary: 100 mg/dL — ABNORMAL HIGH (ref 70–99)
Glucose-Capillary: 121 mg/dL — ABNORMAL HIGH (ref 70–99)

## 2014-08-31 MED ORDER — SODIUM CHLORIDE 0.9 % IJ SOLN
3.0000 mL | Freq: Two times a day (BID) | INTRAMUSCULAR | Status: DC
  Administered 2014-08-31: 3 mL via INTRAVENOUS

## 2014-08-31 MED ORDER — SODIUM CHLORIDE 0.9 % IJ SOLN: 3.0000 mL | INTRAMUSCULAR | Status: DC | PRN

## 2014-08-31 MED ORDER — POLYETHYLENE GLYCOL 3350 17 G PO PACK
17.0000 g | PACK | Freq: Two times a day (BID) | ORAL | Status: DC
Start: 2014-08-31 — End: 2014-09-01
  Administered 2014-08-31 (×2): 17 g via ORAL
  Filled 2014-08-31 (×4): qty 1

## 2014-08-31 NOTE — Progress Notes (Signed)
Patient ID: Vincent Cantu, male   DOB: 1970/11/26, 43 y.o.   MRN: [id number]     [facility]      [address] [place], [place] [address]    Phone: [phone] FAX: [phone]     Subjective: Passing flatus, tolerating PO, walking pain well controlled.  VSS.  Afebrile.   Objective:  Vital signs:  Filed Vitals:   08/30/14 0958 08/30/14 1402 08/30/14 2152 08/31/14 0557  BP: 118/76 148/89 153/100 133/91  Pulse: 68 103  75  Temp:  98 F (36.7 C) 99.4 F (37.4 C) 98.2 F (36.8 C)  TempSrc:  Oral Oral   Resp:  19 18 18   Height:      Weight:      SpO2:  96% 92% 97%    Last BM Date: 08/28/14  Intake/Output   Yesterday:  10/13 0701 - 10/14 0700 In: 1259.6 [P.O.:220; I.V.:1019.6] Out: 1355 [Urine:1350; Drains:5] This shift:    I/O last 3 completed shifts: In: 2384.6 [P.O.:220; I.V.:2144.6; Other:20] Out: 2376 [Urine:3100; Drains:55]     Physical Exam:  General: Pt awake/alert/oriented x4 in no acute distress  Chest: cta. No chest wall pain w good excursion  CV: Pulses intact. Regular rhythm  MS: Normal AROM mjr joints. No obvious deformity  Abdomen: Soft. Nondistended. Appropriately tender. +bs.  Midline wound is clean.  RLQ JP drain with serous output. abd binder in place. No evidence of peritonitis. No incarcerated hernias.  Ext: SCDs BLE. No mjr edema. No cyanosis  Skin: No petechiae / purpura   Problem List:   Principal Problem:   Incarcerated umbilical hernia    Results:   Labs: Results for orders placed during the hospital encounter of 08/28/14 (from the past 48 hour(s))  GLUCOSE, CAPILLARY     Status: Abnormal   Collection Time    08/29/14 12:03 PM      Result Value Ref Range   Glucose-Capillary 140 (*) 70 - 99 mg/dL   Comment 1 Notify RN    GLUCOSE, CAPILLARY     Status: Abnormal   Collection Time    08/29/14  6:32 PM      Result Value Ref Range   Glucose-Capillary 109 (*) 70 - 99 mg/dL   GLUCOSE, CAPILLARY     Status: Abnormal   Collection Time    08/29/14  9:33 PM      Result Value Ref Range   Glucose-Capillary 108 (*) 70 - 99 mg/dL  GLUCOSE, CAPILLARY     Status: None   Collection Time    08/30/14  8:15 AM      Result Value Ref Range   Glucose-Capillary 98  70 - 99 mg/dL  GLUCOSE, CAPILLARY     Status: Abnormal   Collection Time    08/30/14 11:55 AM      Result Value Ref Range   Glucose-Capillary 106 (*) 70 - 99 mg/dL  GLUCOSE, CAPILLARY     Status: Abnormal   Collection Time    08/30/14  5:00 PM      Result Value Ref Range   Glucose-Capillary 107 (*) 70 - 99 mg/dL   Comment 1 Notify RN    GLUCOSE, CAPILLARY     Status: Abnormal   Collection Time    08/30/14  9:51 PM      Result Value Ref Range   Glucose-Capillary 103 (*) 70 - 99 mg/dL   Comment 1 Notify RN    GLUCOSE, CAPILLARY  Status: Abnormal   Collection Time    08/31/14  7:33 AM      Result Value Ref Range   Glucose-Capillary 100 (*) 70 - 99 mg/dL    Imaging / Studies: No results found.  Medications / Allergies:  Scheduled Meds: . amLODipine  10 mg Oral Daily  . antiseptic oral rinse  7 mL Mouth Rinse q12n4p  . chlorhexidine  15 mL Mouth Rinse BID  . enoxaparin (LOVENOX) injection  40 mg Subcutaneous Q24H  . hydrochlorothiazide  12.5 mg Oral Daily  . insulin aspart  0-9 Units Subcutaneous TID WC  . losartan  100 mg Oral Daily  . metoprolol succinate  100 mg Oral Daily  . pneumococcal 23 valent vaccine  0.5 mL Intramuscular Tomorrow-1000   Continuous Infusions: . dextrose 5 % and 0.9% NaCl 50 mL/hr at 08/30/14 1540   PRN Meds:.acetaminophen, acetaminophen, diphenhydrAMINE, diphenhydrAMINE, morphine injection, ondansetron, oxyCODONE-acetaminophen, povidone-iodine  Antibiotics: Anti-infectives   Start     Dose/Rate Route Frequency Ordered Stop   08/29/14 0600  [MAR Hold]  ceFAZolin (ANCEF) IVPB 2 g/50 mL premix     (On MAR Hold since 08/28/14 1907)   2 g 100 mL/hr over 30 Minutes  Intravenous On call to O.R. 08/28/14 1723 08/28/14 1940      Assessment/Plan  POD#3 ventral hernia repair-Dr. Hulen Skains  -advance diet -SLIV -pain control  -SCD/lovenox  -drain care  -abdominal binder  DM II  -hold levemir until tolerating PO. Hold metformin until DC  -SSI, CBGs  HTN  -continue home meds  Dispo-tolerating PO, pain controlled, voiding, ambulating.  Needs to have a BM.  Possible DC today or tomorrow   Erby Pian, [facility] Pager [phone]) For consults and floor pages call [phone](7A-4:30P)  08/31/2014 8:32 AM

## 2014-08-31 NOTE — Progress Notes (Signed)
General surgery attending:  Patient interviewed and examined. Agree with above assessment and plans. Wounds look good. Will give one dose of MiraLAX. May be discharged home once he has a good bowel movement.   Vincent Cantu. Dalbert Batman, M.D., [facility] General and Minimally invasive Surgery Breast and Colorectal Surgery Office:   [phone]

## 2014-09-01 LAB — GLUCOSE, CAPILLARY: Glucose-Capillary: 87 mg/dL (ref 70–99)

## 2014-09-01 MED ORDER — OXYCODONE-ACETAMINOPHEN 5-325 MG PO TABS: 1.0000 | ORAL_TABLET | Freq: Four times a day (QID) | ORAL | Status: DC | PRN

## 2014-09-01 MED ORDER — POLYETHYLENE GLYCOL 3350 17 G PO PACK: 17.0000 g | PACK | Freq: Two times a day (BID) | ORAL | Status: DC

## 2014-09-01 MED ORDER — ACETAMINOPHEN 325 MG PO TABS: 650.0000 mg | ORAL_TABLET | Freq: Four times a day (QID) | ORAL | Status: DC | PRN

## 2014-09-01 NOTE — Discharge Instructions (Signed)
DRAIN CARE:   You have a closed bulb drain to help you heal.  A bulb drain is a small, plastic reservoir which creates a gentle suction. It is used to remove excess fluid from a surgical wound. The color and amount of fluid will vary. Immediately after surgery, the fluid is bright red. It may gradually change to a yellow color. When the amount decreases to about 1 or 2 tablespoons (15 to 30 cc) per 24 hours, your caregiver will usually remove it.  DAILY CARE  Keep the bulb compressed at all times, except while emptying it. The compression creates suction.   Keep sites where the tubes enter the skin dry and covered with a light bandage (dressing).   Tape the tubes to your skin, 1 to 2 inches below the insertion sites, to keep from pulling on your stitches. Tubes are stitched in place and will not slip out.   Pin the bulb to your shirt (not to your pants) with a safety pin.   For the first few days after surgery, there usually is more fluid in the bulb. Empty the bulb whenever it becomes half full because the bulb does not create enough suction if it is too full. Include this amount in your 24 hour totals.   When the amount of drainage decreases, empty the bulb at the same time every day. Write down the amounts and the 24 hour totals. Your caregiver will want to know them. This helps your caregiver know when the tubes can be removed.   (We anticipate removing the drain in 1-3 weeks, depending on when the output is <45mL a day for 2+ days)  If there is drainage around the tube sites, change dressings and keep the area dry. If you see a clot in the tube, leave it alone. However, if the tube does not appear to be draining, let your caregiver know.  TO EMPTY THE BULB  Open the stopper to release suction.   Holding the stopper out of the way, pour drainage into the measuring cup that was sent home with you.   Measure and write down the amount. If there are 2 bulbs, note the amount of drainage  from bulb 1 or bulb 2 and keep the totals separate. Your caregiver will want to know which tube is draining more.   Compress the bulb by folding it in half.   Replace the stopper.   Check the tape that holds the tube to your skin, and pin the bulb to your shirt.  SEEK MEDICAL CARE IF:  The drainage develops a bad odor.   You have an oral temperature above 102 F (38.9 C).   The amount of drainage from your wound suddenly increases or decreases.   You accidentally pull out your drain.   You have any other questions or concerns.  MAKE SURE YOU:   Understand these instructions.   Will watch your condition.   Will get help right away if you are not doing well or get worse.     Call our office if you have any questions about your drain. [phone]   ABDOMINAL SURGERY: POST OP INSTRUCTIONS  1. DIET: Follow a light bland diet the first 24 hours after arrival home, such as soup, liquids, crackers, etc.  Be sure to include lots of fluids daily.  Avoid fast food or heavy meals as your are more likely to get nauseated.  Eat a low fat the next few days after surgery.   2.  Take your usually prescribed home medications unless otherwise directed. 3. PAIN CONTROL: a. Pain is best controlled by a usual combination of three different methods TOGETHER: i. Ice/Heat ii. Over the counter pain medication iii. Prescription pain medication b. Most patients will experience some swelling and bruising around the incisions.  Ice packs or heating pads (30-60 minutes up to 6 times a day) will help. Use ice for the first few days to help decrease swelling and bruising, then switch to heat to help relax tight/sore spots and speed recovery.  Some people prefer to use ice alone, heat alone, alternating between ice & heat.  Experiment to what works for you.  Swelling and bruising can take several weeks to resolve.   c. It is helpful to take an over-the-counter pain medication regularly for the first few  weeks.  Choose one of the following that works best for you: i. Naproxen (Aleve, etc)  Two 220mg  tabs twice a day ii. Ibuprofen (Advil, etc) Three 200mg  tabs four times a day (every meal & bedtime) iii. Acetaminophen (Tylenol, etc) 500-650mg  four times a day (every meal & bedtime) d. A  prescription for pain medication (such as oxycodone, hydrocodone, etc) should be given to you upon discharge.  Take your pain medication as prescribed.  i. If you are having problems/concerns with the prescription medicine (does not control pain, nausea, vomiting, rash, itching, etc), please call us [phone] to see if we need to switch you to a different pain medicine that will work better for you and/or control your side effect better. ii. If you need a refill on your pain medication, please contact your pharmacy.  They will contact our office to request authorization. Prescriptions will not be filled after 5 pm or on week-ends. 4. Avoid getting constipated.  Between the surgery and the pain medications, it is common to experience some constipation.  Increasing fluid intake and taking a fiber supplement (such as Metamucil, Citrucel, FiberCon, MiraLax, etc) 1-2 times a day regularly will usually help prevent this problem from occurring.  A mild laxative (prune juice, Milk of Magnesia, MiraLax, etc) should be taken according to package directions if there are no bowel movements after 48 hours.   5. Watch out for diarrhea.  If you have many loose bowel movements, simplify your diet to bland foods & liquids for a few days.  Stop any stool softeners and decrease your fiber supplement.  Switching to mild anti-diarrheal medications (Kayopectate, Pepto Bismol) can help.  If this worsens or does not improve, please call us. 6. Wash / shower every day.  You may shower over the incision / wound.  Avoid baths until the skin is fully healed.  Continue to shower over incision(s) after the dressing is off. 7. Remove your waterproof  bandages 5 days after surgery.  You may leave the incision open to air.  You may replace a dressing/Band-Aid to cover the incision for comfort if you wish. 8. ACTIVITIES as tolerated:   a. You may resume regular (light) daily activities beginning the next day--such as daily self-care, walking, climbing stairs--gradually increasing activities as tolerated.  If you can walk 30 minutes without difficulty, it is safe to try more intense activity such as jogging, treadmill, bicycling, low-impact aerobics, swimming, etc. b. Save the most intensive and strenuous activity for last such as sit-ups, heavy lifting, contact sports, etc  Refrain from any heavy lifting or straining until you are off narcotics for pain control.   c. DO NOT PUSH THROUGH  PAIN.  Let pain be your guide: If it hurts to do something, don't do it.  Pain is your body warning you to avoid that activity for another week until the pain goes down. d. You may drive when you are no longer taking prescription pain medication, you can comfortably wear a seatbelt, and you can safely maneuver your car and apply brakes. e. Dennis Bast may have sexual intercourse when it is comfortable.  9. FOLLOW UP in our office a. Please call [facility] at [phone] to set up an appointment to see your surgeon in the office for a follow-up appointment approximately 1-2 weeks after your surgery. b. Make sure that you call for this appointment the day you arrive home to insure a convenient appointment time. 10. IF YOU HAVE DISABILITY OR FAMILY LEAVE FORMS, BRING THEM TO THE OFFICE FOR PROCESSING.  DO NOT GIVE THEM TO YOUR DOCTOR.   WHEN TO CALL us [phone]: 1. Poor pain control 2. Reactions / problems with new medications (rash/itching, nausea, etc)  3. Fever over 101.5 F (38.5 C) 4. Inability to urinate 5. Nausea and/or vomiting 6. Worsening swelling or bruising 7. Continued bleeding from incision. 8. Increased pain, redness, or drainage from the incision  The  clinic staff is available to answer your questions during regular business hours (8:30am-5pm).  Please dont hesitate to call and ask to speak to one of our nurses for clinical concerns.   A surgeon from [facility] is always on call at the hospitals   If you have a medical emergency, go to the nearest emergency room or call 911.    [facility], [place], [place], [place], [place]  [address] ? MAIN: [phone] ? TOLL FREE: [phone] ? FAX [phone] [email or web address]

## 2014-09-01 NOTE — Discharge Summary (Signed)
I agree with discharge summary, discharge plans and followup as outlined.  Adin Hector

## 2014-09-01 NOTE — Progress Notes (Signed)
4 Days Post-Op  Subjective: Stable and alert. Wants to go home. Had a good bowel movement.tolerating regular diet Wound drainage 90 cc last 24 hours.  Objective: Vital signs in last 24 hours: Temp:  [98.7 F (37.1 C)-99.5 F (37.5 C)] 98.7 F (37.1 C) (10/15 0529) Pulse Rate:  [76-77] 76 (10/15 0529) Resp:  [17-18] 17 (10/15 0529) BP: (136-137)/(81-88) 136/85 mmHg (10/15 0529) SpO2:  [94 %-96 %] 96 % (10/15 0529) Last BM Date: 08/28/14  Intake/Output from previous day: 10/14 0701 - 10/15 0700 In: 1060 [P.O.:1060] Out: 90 [Drains:90] Intake/Output this shift: Total I/O In: 580 [P.O.:580] Out: 60 [Drains:60]  General appearance: alert and cooperative. Good spirits. No distress. Mental status normal. Resp: clear to auscultation bilaterally GI: abdomen soft. Minimally tender. Wound clean. Tissue soft. No evidence of infection or hematoma.  Drainage serosanguineous.  Lab Results:  Results for orders placed during the hospital encounter of 08/28/14 (from the past 24 hour(s))  GLUCOSE, CAPILLARY     Status: Abnormal   Collection Time    08/31/14  7:33 AM      Result Value Ref Range   Glucose-Capillary 100 (*) 70 - 99 mg/dL  GLUCOSE, CAPILLARY     Status: Abnormal   Collection Time    08/31/14 12:06 PM      Result Value Ref Range   Glucose-Capillary 121 (*) 70 - 99 mg/dL  GLUCOSE, CAPILLARY     Status: Abnormal   Collection Time    08/31/14  5:38 PM      Result Value Ref Range   Glucose-Capillary 104 (*) 70 - 99 mg/dL  GLUCOSE, CAPILLARY     Status: Abnormal   Collection Time    08/31/14  9:33 PM      Result Value Ref Range   Glucose-Capillary 118 (*) 70 - 99 mg/dL   Comment 1 Notify RN       Studies/Results: No results found.  Marland Kitchen amLODipine  10 mg Oral Daily  . antiseptic oral rinse  7 mL Mouth Rinse q12n4p  . chlorhexidine  15 mL Mouth Rinse BID  . enoxaparin (LOVENOX) injection  40 mg Subcutaneous Q24H  . hydrochlorothiazide  12.5 mg Oral Daily  . insulin  aspart  0-9 Units Subcutaneous TID WC  . losartan  100 mg Oral Daily  . metoprolol succinate  100 mg Oral Daily  . polyethylene glycol  17 g Oral BID  . sodium chloride  3 mL Intravenous Q12H     Assessment/Plan: s/p Procedure(s): HERNIA REPAIR VENTRAL ADULT  POD#4 ventral hernia repair-Dr. Hulen Skains  Patient now meets all discharge criteria and will be discharged home today. We will leave the drain in due to the volume of drainage. We will remove the drain in the office next week signed long-term followup will be with Dr. Hulen Skains.  DM II  Good control on perioperative regimen. Resume normal diet and insulin coverage at home  HTN  -Good control on home meds   Dispo - home today. followup office next week for drain removal. Long-term followup with Dr. Hulen Skains.   @PROBHOSP @  LOS: 4 days    Vincent Cantu M 09/01/2014  . .prob

## 2014-09-01 NOTE — Progress Notes (Signed)
D/c to home with wife via wheelchair. VSS. Breathing regular and unlabored on room air.  Pain med given before d/c for at 6/10 pain.  D/c instructions given and demostrated understanding via teachback

## 2014-09-01 NOTE — Discharge Summary (Signed)
Physician Discharge Summary  Vincent Cantu IOX:[id number] DOB: October 09, 1971 DOA: 08/28/2014  PCP: Wyatt Haste, MD  Consultation: none  Admit date: 08/28/2014 Discharge date: 09/01/2014  Recommendations for Outpatient Follow-up:   Follow-up Information   Follow up with Doreen Salvage, MD On 09/06/2014. (arrive by 10:30AM for a 11AM appt.)    Specialty:  General Surgery   Contact information:   [address] [place], [place] [address] [phone]      Discharge Diagnoses:  1. Incarcerated recurrent ventral hernia 2. Diabetes mellitus type II 3. HTN   Surgical Procedure: ventral hernia repair---Dr. Hulen Skains 08/28/14  Discharge Condition: stable Disposition: home  Diet recommendation: carb modified   Filed Weights   08/28/14 1003 08/28/14 1507  Weight: 297 lb (134.718 kg) 295 lb 3.2 oz (133.902 kg)     Filed Vitals:   09/01/14 0529  BP: 136/85  Pulse: 76  Temp: 98.7 F (37.1 C)  Resp: 17     Hospital Course:  Vincent Cantu is a 43 year old male with a history of HTN, DM, obesity, umbilical hernia repair x2 who presented to [facility] with a recurrent incarcerated umbilical hernia demonstrated by a CT.  He underwent urgent repair with Dr. Hulen Skains.  He tolerated the procedure well and was transferred to the floor.  He was mobilized and progressed.  Diet was advanced.  His drain output was initially low, but increased it was therefore kept in place at discharge to follow up with Dr. Hulen Skains or the urgent clinic in 1 week per Dr. Dalbert Batman. This was arranged on behalf of the patient.  On POD#4 the patient was having BMs, tolerating a diet, pain well controlled and VSS.  He was therefore felt stable for discharge.  He works a Network engineer job in Therapist, art, letter written to return in 3 weeks.  Medication risks, benefits and therapeutic alternatives were reviewed with the patient.  He verbalizes understanding. He was encouraged to call with questions  or concerns.     Discharge Instructions     Medication List         acetaminophen 325 MG tablet  Commonly known as:  TYLENOL  Take 2 tablets (650 mg total) by mouth every 6 (six) hours as needed for mild pain (or Temp > 100).     amLODipine 10 MG tablet  Commonly known as:  NORVASC  Take 1 tablet (10 mg total) by mouth daily.     atorvastatin 20 MG tablet  Commonly known as:  LIPITOR  Take 20 mg by mouth daily.     insulin detemir 100 unit/ml Soln  Commonly known as:  LEVEMIR  Inject 16 Units into the skin 2 (two) times daily.     losartan-hydrochlorothiazide 100-12.5 MG per tablet  Commonly known as:  HYZAAR  Take 1 tablet by mouth daily.     metFORMIN 1000 MG tablet  Commonly known as:  GLUCOPHAGE  Take 1 tablet (1,000 mg total) by mouth 2 (two) times daily with a meal.     metoprolol succinate 100 MG 24 hr tablet  Commonly known as:  TOPROL-XL  Take 100 mg by mouth daily. Take with or immediately following a meal.     MULTIVITAMIN & MINERAL PO  Take 1 tablet by mouth daily.     oxyCODONE-acetaminophen 5-325 MG per tablet  Commonly known as:  PERCOCET/ROXICET  Take 1 tablet by mouth every 6 (six) hours as needed (pain).     polyethylene glycol  packet  Commonly known as:  MIRALAX / GLYCOLAX  Take 17 g by mouth 2 (two) times daily.           Follow-up Information   Follow up with Doreen Salvage, MD On 09/06/2014. (arrive by 10:30AM for a 11AM appt.)    Specialty:  General Surgery   Contact information:   [address], [place] [address] [phone]        The results of significant diagnostics from this hospitalization (including imaging, microbiology, ancillary and laboratory) are listed below for reference.    Significant Diagnostic Studies: Ct Abdomen Pelvis Wo Contrast  08/28/2014   CLINICAL DATA:  History of hernia repair. Abdominal pain. Evaluate for possible incarcerated hernia/bowel.  EXAM: CT  ABDOMEN AND PELVIS WITHOUT CONTRAST  TECHNIQUE: Multidetector CT imaging of the abdomen and pelvis was performed following the standard protocol without IV contrast.  COMPARISON:  05/30/2006  FINDINGS: Lung bases are clear. No effusions. Heart is normal size.  Liver, gallbladder, spleen, pancreas, adrenals and kidneys are normal.  Large complex ventral hernias. Two closely oriented defects are noted through the anterior abdominal wall on image 50 to the right ventral hernia measures approximately 9.5 cm in diameter. The left ventral hernia measures 5.6 cm. The right ventral hernia contains a small bowel loop. Small bowel proximal to this hernia is mildly dilated. There is mild stranding around the herniated small bowel loop in the hernia sac. Findings concerning for incarcerated hernia with early bowel obstruction. No bowel within the left ventral hernia, which contains fat.  Stomach, large bowel unremarkable. No free fluid, free air or adenopathy. Left inguinal hernia again noted, stable since prior study. Aorta is normal caliber.  No acute bony abnormality. Degenerative changes in the lower lumbar spine.  IMPRESSION: Two adjacent ventral wall hernias. The right periumbilical ventral hernia contains a small bowel loop with possible incarceration and early small bowel obstruction. The left periumbilical ventral wall hernia contains fat.  Left inguinal hernia with stable appearance since 2007.   Electronically Signed   By: Rolm Baptise M.D.   On: 08/28/2014 16:44    Microbiology: Recent Results (from the past 240 hour(s))  SURGICAL PCR SCREEN     Status: None   Collection Time    08/28/14  5:39 PM      Result Value Ref Range Status   MRSA, PCR NEGATIVE  NEGATIVE Final   Staphylococcus aureus NEGATIVE  NEGATIVE Final   Comment:            The Xpert SA Assay (FDA     approved for NASAL specimens     in patients over 3 years of age),     is one component of     a comprehensive surveillance     program.   Test performance has     been validated by [employer] for patients greater     than or equal to 54 year old.     It is not intended     to diagnose infection nor to     guide or monitor treatment.     Labs: Basic Metabolic Panel:  Recent Labs Lab 08/28/14 1035 08/29/14 0543  NA 140 138  K 3.5* 4.1  CL 104 102  CO2 23 23  GLUCOSE 101* 112*  BUN 14 9  CREATININE 1.13 1.09  CALCIUM 8.9 8.7   Liver Function Tests:  Recent Labs Lab 08/28/14 1035  AST 18  ALT 11  ALKPHOS 86  BILITOT 0.4  PROT 7.8  ALBUMIN 3.8   No results found for this basename: LIPASE, AMYLASE,  in the last 168 hours No results found for this basename: AMMONIA,  in the last 168 hours CBC:  Recent Labs Lab 08/28/14 1035 08/29/14 0543  WBC 5.3 9.6  NEUTROABS 3.4 7.4  HGB 12.0* 11.8*  HCT 37.7* 36.8*  MCV 82.3 82.1  PLT 209 205   Cardiac Enzymes: No results found for this basename: CKTOTAL, CKMB, CKMBINDEX, TROPONINI,  in the last 168 hours BNP: BNP (last 3 results) No results found for this basename: PROBNP,  in the last 8760 hours CBG:  Recent Labs Lab 08/31/14 0733 08/31/14 1206 08/31/14 1738 08/31/14 2133 09/01/14 0720  GLUCAP 100* 121* 104* 118* 87    Principal Problem:   Incarcerated umbilical hernia   Time coordinating discharge: <30 mins  Signed:  Dioselina Brumbaugh, ANP-BC

## 2014-10-10 ENCOUNTER — Telehealth: Payer: Self-pay | Admitting: Internal Medicine

## 2014-10-10 MED ORDER — INSULIN DETEMIR 100 UNIT/ML FLEXPEN: 16.0000 [IU] | Freq: Two times a day (BID) | SUBCUTANEOUS | Status: DC

## 2014-10-10 NOTE — Telephone Encounter (Signed)
Wanting a request for levemir to [facility]

## 2014-10-20 ENCOUNTER — Other Ambulatory Visit: Payer: Self-pay | Admitting: Medical

## 2014-10-25 ENCOUNTER — Ambulatory Visit: Payer: BC Managed Care – PPO | Admitting: Dietician

## 2014-10-31 ENCOUNTER — Other Ambulatory Visit: Payer: Self-pay | Admitting: Medical

## 2014-11-10 ENCOUNTER — Encounter: Payer: Self-pay | Admitting: *Deleted

## 2014-11-10 ENCOUNTER — Encounter: Payer: BC Managed Care – PPO | Attending: Family Medicine | Admitting: *Deleted

## 2014-11-10 VITALS — Ht 75.0 in | Wt 287.2 lb

## 2014-11-10 DIAGNOSIS — E119 Type 2 diabetes mellitus without complications: Secondary | ICD-10-CM | POA: Diagnosis present

## 2014-11-10 DIAGNOSIS — Z713 Dietary counseling and surveillance: Secondary | ICD-10-CM | POA: Diagnosis not present

## 2014-11-10 NOTE — Patient Instructions (Signed)
Plan:  Aim for 4 Carb Choices per meal (60 grams) +/- 1 either way  Aim for 0-2 Carbs per snack if hungry  Include protein in moderation with your meals and snacks Consider reading food labels for Total Carbohydrate of foods Continue with your activity level for 30 - 60 minutes daily as tolerated Continue checking BG at alternate times per day as directed by MD  Continue taking medication for diabetes as directed by MD

## 2014-11-10 NOTE — Progress Notes (Signed)
  Medical Nutrition Therapy:  Appt start time: 0730 end time:  0830.  Assessment:  Primary concerns today: 11/10/14. He is here with his wife who appears very supportive.  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 Was diagnosed with DM 2 in July, 2015. They share the food shopping and cooking. He is SMBG twice a day with reported range of 88-125 mg/dl pre and post meal but typically around 110 mg/dl. No symptoms of hypoglycemia. Since he was diagnosed he gets on treadmill at gym for 30 minutes 5-7 days a week.    Preferred Learning Style:   No preference indicated   Learning Readiness:   Ready  Change in progress  MEDICATIONS: see list, diabetes meds: Levemir and Metformin   DIETARY INTAKE:  24-hr recall:  B ( AM): fruit OR biscuit OR egg OR PNB and jelly sandwich, water  Snk ( AM): fruit, yogurt  L ( PM): brings from home: salad with lean meat, fruit Snk ( PM): same as AM D ( PM): meat, vegetables, salad, occasional starch, water Snk ( PM): [place] Ice, fruit, popcorn or something like that Beverages: water  Usual physical activity: gym every day  Estimated energy needs: 2000 calories 225 g carbohydrates 150 g protein 56 g fat  Progress Towards Goal(s):  In progress.   Nutritional Diagnosis:  NB-1.1 Food and nutrition-related knowledge deficit As related to Diabetes.  As evidenced by A1c which has improved from 14.1 % at diagnosis to 7.2% now!.    Intervention:  Nutrition counseling and diabetes education initiated. Discussed Carb Counting as method of portion control, reading food labels, and benefits of increased activity. Also discussed action of his Diabetes medications, causes, prevention and treatment of hypoglycemia, SMBG target ranges and A1c guidelines.  Plan:  Aim for 4 Carb Choices per meal (60 grams) +/- 1 either way  Aim for 0-2 Carbs per snack if hungry  Include protein in moderation with your meals and snacks Consider reading food labels for  Total Carbohydrate of foods Continue with your activity level for 30 - 60 minutes daily as tolerated Continue checking BG at alternate times per day as directed by MD  Continue taking medication for diabetes as directed by MD  Teaching Method Utilized: Visual, Auditory and Hands on  Handouts given during visit include: Living Well with Diabetes Carb Counting and Food Label handouts Meal Plan Card Diabetes medication handout  Barriers to learning/adherence to lifestyle change: none  Demonstrated degree of understanding via:  Teach Back   Monitoring/Evaluation:  Dietary intake, exercise, reading food labels, SMBG, and body weight prn.

## 2014-12-27 ENCOUNTER — Ambulatory Visit (INDEPENDENT_AMBULATORY_CARE_PROVIDER_SITE_OTHER): Payer: BLUE CROSS/BLUE SHIELD | Admitting: Family Medicine

## 2014-12-27 ENCOUNTER — Encounter: Payer: Self-pay | Admitting: Family Medicine

## 2014-12-27 VITALS — BP 138/88 | HR 68 | Wt 281.0 lb

## 2014-12-27 DIAGNOSIS — E669 Obesity, unspecified: Secondary | ICD-10-CM

## 2014-12-27 DIAGNOSIS — E1159 Type 2 diabetes mellitus with other circulatory complications: Secondary | ICD-10-CM

## 2014-12-27 DIAGNOSIS — E782 Mixed hyperlipidemia: Secondary | ICD-10-CM

## 2014-12-27 DIAGNOSIS — I152 Hypertension secondary to endocrine disorders: Secondary | ICD-10-CM

## 2014-12-27 DIAGNOSIS — E1169 Type 2 diabetes mellitus with other specified complication: Secondary | ICD-10-CM

## 2014-12-27 DIAGNOSIS — B351 Tinea unguium: Secondary | ICD-10-CM

## 2014-12-27 DIAGNOSIS — I10 Essential (primary) hypertension: Secondary | ICD-10-CM

## 2014-12-27 DIAGNOSIS — E119 Type 2 diabetes mellitus without complications: Secondary | ICD-10-CM

## 2014-12-27 LAB — POCT GLYCOSYLATED HEMOGLOBIN (HGB A1C): Hemoglobin A1C: 5.7

## 2014-12-27 MED ORDER — TERBINAFINE HCL 250 MG PO TABS: 250.0000 mg | ORAL_TABLET | Freq: Every day | ORAL | Status: DC

## 2014-12-27 NOTE — Progress Notes (Deleted)
  Subjective:    Patient ID: Vincent Cantu, male    DOB: Aug 01, 1971, 44 y.o.   MRN: [id number]  Vincent Cantu is a 44 y.o. male who presents for follow-up of Type 2 diabetes mellitus.  Home blood sugar records: {diabetes glucometry results:16657} Current symptoms/problems include {Symptoms; diabetes:14075} and have been {Desc; course:15616}. Daily foot checks:   Any foot concerns: *** Exercise: {types:19826}  The following portions of the patient's history were reviewed and updated as appropriate: allergies, current medications, past medical history, past social history and problem list.  ROS as in subjective above.     Objective:    Physical Exam Alert and in no distress otherwise not examined.  There were no vitals taken for this visit.  Lab Review Diabetic Labs Latest Ref Rng 08/29/2014 08/28/2014 08/23/2014 06/28/2014 06/13/2014  HbA1c - - - 7.2 - -  Microalbumin 0.0-1.9 mg/dL - - - - -  Micro/Creat Ratio 0.0-30.0 mg/g - - - - -  Chol 0 - 200 mg/dL - - 115 - -  HDL >39 mg/dL - - 33(L) - -  Calc LDL 0 - 99 mg/dL - - 70 - -  Triglycerides <150 mg/dL - - 62 - -  Creatinine 0.50 - 1.35 mg/dL 1.09 1.13 - 1.25 1.49(H)  GFR >60.00 mL/min - - - - -   BP/Weight 11/10/2014 09/01/2014 08/28/2014 08/28/2014 90/01/91  Systolic BP - 330 - 076 226  Diastolic BP - 85 - 333 86  Wt. (Lbs) 287.2 - 295.2 - 300  BMI 35.9 - 36.9 - 37.5   No flowsheet data found.  Kohler  reports that he has never smoked. He does not have any smokeless tobacco history on file. He reports that he does not drink alcohol or use illicit drugs.     Assessment & Plan:    No diagnosis found.  1. Rx changes: {none:33079} 2. Education: Reviewed 'ABCs' of diabetes management (respective goals in parentheses):  A1C (<7), blood pressure (<130/80), and cholesterol (LDL <100). 3. Compliance at present is estimated to be {good/fair/poor:33178}. Efforts to improve compliance (if necessary) will be directed at  {compliance:16716}. 4. Follow up: {NUMBERS; 0-10:33138} {time:11}

## 2014-12-27 NOTE — Patient Instructions (Addendum)
Stop your insulin . Throat awake Keep track of your blood sugars either before a meal or 2 hours after meal and if the numbers start to go above 180 after a meal let me know

## 2014-12-27 NOTE — Progress Notes (Signed)
   Subjective:    Patient ID: Vincent Cantu, male    DOB: Dec 11, 1970, 44 y.o.   MRN: [id number]  HPI He is here for a 4 month Diabetes check. He has been checking his blood sugars twice daily and reports morning readings of 105, 100, 78, 80 and evening readings in the 120s consistently. He has been eating low carb diet and exercising, walking on the treadmill, 30 mins per day 5 days per week. He denies vision changes. He complains of having a thick and dry toenail to his great toe on the left foot .e He is taking Metformin 1,000mg  bid as prescribed and states he is taking Levemir inconsistently based on his blood sugar readings and has not taken Levemir at all in previous 3 days.   He reports a 2 day history of "watery" diarrhea with 2 episodes 2 days ago and one episode yesterday. He denies diarrhea today. He also denies fever/chills, nausea, vomiting, or abdominal pain. He reports co-workers with "GI virus".      Review of Systems  All other systems reviewed and are negative.      Objective:   Physical Exam He  is alert and in no distress. Tympanic membranes and canals are normal. Throat is clear. Tonsils are normal. Mucous membranes are pink and moist. Neck is supple without adenopathy. Cardiac exam shows a regular sinus rhythm without murmurs or gallops. Lungs are clear to auscultation.Abdomen is soft, non distended and non tender with non hepatosplenomegaly. Nail on great toe to left foot appears thickened and raised. Other nails are involved with thickening IMA globin A1c 5.7      Assessment & Plan:  Diabetes mellitus, new onset - Plan: POCT glycosylated hemoglobin (Hb A1C)  Hypertension associated with diabetes  Onychomycosis - Plan: terbinafine (LAMISIL) 250 MG tablet  Obesity (BMI 30-39.9)  Mixed hyperlipidemia due to type 2 diabetes mellitus  His AIC today is 5.7. He will stop his Levemir but continue Metformin and we will see how he is doing with this therapy in 4  months. He will call if any evening blood sugars in the 180s or greater and will consider adding an SGLT2 at that point. Encouraged that he continue eating a healthy diet as well as his exercise regimen, he is doing a good job with weight loss and taking care of himself, he is no longer morbidly obese.  Lamisil prescribed for oncychomycosis. He will follow-up in 2 weeks for check of his LFTs. Discussed symptomatic care for diarrhea since this appears to be improving.

## 2015-01-02 ENCOUNTER — Other Ambulatory Visit: Payer: Self-pay | Admitting: Medical

## 2015-03-23 ENCOUNTER — Ambulatory Visit (INDEPENDENT_AMBULATORY_CARE_PROVIDER_SITE_OTHER): Payer: BLUE CROSS/BLUE SHIELD | Admitting: Family Medicine

## 2015-03-23 ENCOUNTER — Encounter: Payer: Self-pay | Admitting: Family Medicine

## 2015-03-23 VITALS — BP 130/90 | HR 67 | Wt 298.4 lb

## 2015-03-23 DIAGNOSIS — Z79899 Other long term (current) drug therapy: Secondary | ICD-10-CM

## 2015-03-23 DIAGNOSIS — E669 Obesity, unspecified: Secondary | ICD-10-CM

## 2015-03-23 DIAGNOSIS — E119 Type 2 diabetes mellitus without complications: Secondary | ICD-10-CM | POA: Diagnosis not present

## 2015-03-23 DIAGNOSIS — E118 Type 2 diabetes mellitus with unspecified complications: Secondary | ICD-10-CM | POA: Insufficient documentation

## 2015-03-23 DIAGNOSIS — B351 Tinea unguium: Secondary | ICD-10-CM | POA: Diagnosis not present

## 2015-03-23 HISTORY — DX: Type 2 diabetes mellitus with unspecified complications: E11.8

## 2015-03-23 LAB — COMPREHENSIVE METABOLIC PANEL
ALBUMIN: 4.1 g/dL (ref 3.5–5.2)
ALT: 13 U/L (ref 0–53)
AST: 16 U/L (ref 0–37)
Alkaline Phosphatase: 77 U/L (ref 39–117)
BUN: 12 mg/dL (ref 6–23)
CO2: 19 mEq/L (ref 19–32)
CREATININE: 1.22 mg/dL (ref 0.50–1.35)
Calcium: 9.4 mg/dL (ref 8.4–10.5)
Chloride: 107 mEq/L (ref 96–112)
Glucose, Bld: 137 mg/dL — ABNORMAL HIGH (ref 70–99)
Potassium: 3.8 mEq/L (ref 3.5–5.3)
Sodium: 140 mEq/L (ref 135–145)
TOTAL PROTEIN: 7.7 g/dL (ref 6.0–8.3)
Total Bilirubin: 0.5 mg/dL (ref 0.2–1.2)

## 2015-03-23 LAB — POCT GLYCOSYLATED HEMOGLOBIN (HGB A1C): Hemoglobin A1C: 5.8

## 2015-03-23 NOTE — Progress Notes (Signed)
   Subjective:    Patient ID: Vincent Cantu, male    DOB: May 24, 1971, 44 y.o.   MRN: [id number]  HPI He is here for a medication recheck. He does complain of some left lateral chest pain but no shortness of breath, diaphoresis, PND. The pain goes away with stretching. He continues on his Lamisil and is having no difficulty with that. He states his blood sugars are running in the low 100s. He is walking regularly and states is closer fitting much looser in fact he has had to give some away.   Review of Systems     Objective:   Physical Exam Alert and in no distress. Exam of his feet does show evidence of onychomycosis and little if any improvement.       Assessment & Plan:  Onychomycosis - Plan: Comprehensive metabolic panel  Obesity (BMI 30-39.9)  Diabetes mellitus without complication  Encounter for long-term (current) use of medications - Plan: Comprehensive metabolic panel Encouraged him to continue with his physical activities. Discussed his weight but since he is losing inches, I will not press this. Recheck here in about 3 months.

## 2015-04-18 ENCOUNTER — Other Ambulatory Visit: Payer: Self-pay | Admitting: Family Medicine

## 2015-06-27 ENCOUNTER — Other Ambulatory Visit: Payer: Self-pay | Admitting: Medical

## 2015-06-27 ENCOUNTER — Other Ambulatory Visit: Payer: Self-pay | Admitting: Family Medicine

## 2015-07-20 ENCOUNTER — Telehealth: Payer: Self-pay

## 2015-07-20 NOTE — Telephone Encounter (Signed)
Wife requested refills on Amlodipine 325mg  #90, Atorvastatin 20mg  #90, Losartan 100-12.5mg  #90, Metformin 1000mg  #90, and Metoprolol 100mg  #90. She would like for all of these to be sent to the [facility] pharmacy and she requested that these prescriptions have "PPOF" on them. She said it would let the pharmacy know to call her before filling them.

## 2015-07-20 NOTE — Telephone Encounter (Signed)
I have no idea what PPOF means and why it needs to be put on the prescriptions. Find out and go ahead and call them in make sure she is on his hippa

## 2015-07-25 ENCOUNTER — Ambulatory Visit (INDEPENDENT_AMBULATORY_CARE_PROVIDER_SITE_OTHER): Payer: Managed Care, Other (non HMO) | Admitting: Family Medicine

## 2015-07-25 ENCOUNTER — Encounter: Payer: Self-pay | Admitting: Family Medicine

## 2015-07-25 ENCOUNTER — Other Ambulatory Visit: Payer: Self-pay | Admitting: Internal Medicine

## 2015-07-25 ENCOUNTER — Other Ambulatory Visit: Payer: Self-pay

## 2015-07-25 VITALS — BP 140/90 | HR 72 | Ht 75.0 in | Wt 311.0 lb

## 2015-07-25 DIAGNOSIS — I152 Hypertension secondary to endocrine disorders: Secondary | ICD-10-CM

## 2015-07-25 DIAGNOSIS — B351 Tinea unguium: Secondary | ICD-10-CM | POA: Diagnosis not present

## 2015-07-25 DIAGNOSIS — E1159 Type 2 diabetes mellitus with other circulatory complications: Secondary | ICD-10-CM

## 2015-07-25 DIAGNOSIS — E782 Mixed hyperlipidemia: Secondary | ICD-10-CM | POA: Diagnosis not present

## 2015-07-25 DIAGNOSIS — Z23 Encounter for immunization: Secondary | ICD-10-CM

## 2015-07-25 DIAGNOSIS — E1169 Type 2 diabetes mellitus with other specified complication: Secondary | ICD-10-CM | POA: Diagnosis not present

## 2015-07-25 DIAGNOSIS — I10 Essential (primary) hypertension: Principal | ICD-10-CM

## 2015-07-25 DIAGNOSIS — E669 Obesity, unspecified: Secondary | ICD-10-CM

## 2015-07-25 DIAGNOSIS — E119 Type 2 diabetes mellitus without complications: Secondary | ICD-10-CM | POA: Diagnosis not present

## 2015-07-25 LAB — LIPID PANEL
Cholesterol: 132 mg/dL (ref 125–200)
HDL: 30 mg/dL — ABNORMAL LOW (ref >=40)
LDL Cholesterol: 85 mg/dL (ref <130)
Total CHOL/HDL Ratio: 4.4 Ratio (ref <=5.0)
Triglycerides: 83 mg/dL (ref <150)
VLDL: 17 mg/dL (ref <30)

## 2015-07-25 LAB — CBC WITH DIFFERENTIAL/PLATELET
BASOS ABS: 0 10*3/uL (ref 0.0–0.1)
Basophils Relative: 0 % (ref 0–1)
EOS PCT: 1 % (ref 0–5)
Eosinophils Absolute: 0.1 10*3/uL (ref 0.0–0.7)
HCT: 39.7 % (ref 39.0–52.0)
Hemoglobin: 13.2 g/dL (ref 13.0–17.0)
LYMPHS PCT: 44 % (ref 12–46)
Lymphs Abs: 2.8 10*3/uL (ref 0.7–4.0)
MCH: 27 pg (ref 26.0–34.0)
MCHC: 33.2 g/dL (ref 30.0–36.0)
MCV: 81.2 fL (ref 78.0–100.0)
MPV: 10.9 fL (ref 8.6–12.4)
Monocytes Absolute: 0.7 10*3/uL (ref 0.1–1.0)
Monocytes Relative: 11 % (ref 3–12)
NEUTROS PCT: 44 % (ref 43–77)
Neutro Abs: 2.8 10*3/uL (ref 1.7–7.7)
PLATELETS: 260 10*3/uL (ref 150–400)
RBC: 4.89 MIL/uL (ref 4.22–5.81)
RDW: 14.4 % (ref 11.5–15.5)
WBC: 6.3 10*3/uL (ref 4.0–10.5)

## 2015-07-25 LAB — COMPREHENSIVE METABOLIC PANEL
ALK PHOS: 77 U/L (ref 40–115)
ALT: 20 U/L (ref 9–46)
AST: 18 U/L (ref 10–40)
Albumin: 4.2 g/dL (ref 3.6–5.1)
BILIRUBIN TOTAL: 0.5 mg/dL (ref 0.2–1.2)
BUN: 13 mg/dL (ref 7–25)
CO2: 24 mmol/L (ref 20–31)
CREATININE: 1.18 mg/dL (ref 0.60–1.35)
Calcium: 9.5 mg/dL (ref 8.6–10.3)
Chloride: 104 mmol/L (ref 98–110)
GLUCOSE: 92 mg/dL (ref 65–99)
Potassium: 4 mmol/L (ref 3.5–5.3)
SODIUM: 140 mmol/L (ref 135–146)
Total Protein: 7.8 g/dL (ref 6.1–8.1)

## 2015-07-25 LAB — POCT UA - MICROALBUMIN
ALBUMIN/CREATININE RATIO, URINE, POC: 15.2
CREATININE, POC: 218.5 mg/dL
Microalbumin Ur, POC: 33.3 mg/L

## 2015-07-25 LAB — POCT GLYCOSYLATED HEMOGLOBIN (HGB A1C): Hemoglobin A1C: 6

## 2015-07-25 MED ORDER — METOPROLOL SUCCINATE ER 100 MG PO TB24: ORAL_TABLET | ORAL | Status: DC

## 2015-07-25 MED ORDER — ATORVASTATIN CALCIUM 20 MG PO TABS: 20.0000 mg | ORAL_TABLET | Freq: Every day | ORAL | Status: DC

## 2015-07-25 MED ORDER — LOSARTAN POTASSIUM-HCTZ 100-12.5 MG PO TABS: 1.0000 | ORAL_TABLET | Freq: Every day | ORAL | Status: DC

## 2015-07-25 MED ORDER — TERBINAFINE HCL 250 MG PO TABS: 250.0000 mg | ORAL_TABLET | Freq: Every day | ORAL | Status: DC

## 2015-07-25 MED ORDER — AMLODIPINE BESYLATE 10 MG PO TABS: 10.0000 mg | ORAL_TABLET | Freq: Every day | ORAL | Status: DC

## 2015-07-25 MED ORDER — METFORMIN HCL 1000 MG PO TABS: 1000.0000 mg | ORAL_TABLET | Freq: Two times a day (BID) | ORAL | Status: DC

## 2015-07-25 NOTE — Addendum Note (Signed)
Addended by: Minette Headland A on: 07/25/2015 01:50 PM   Modules accepted: Orders

## 2015-07-25 NOTE — Telephone Encounter (Signed)
Called out all meds to [facility] delivery since they failed to go to pharmacy

## 2015-07-25 NOTE — Progress Notes (Signed)
  Subjective:    Patient ID: Vincent Cantu, male    DOB: 06/03/1971, 44 y.o.   MRN: [id number]  Vincent Cantu is a 44 y.o. male who presents for follow-up of Type 2 diabetes mellitus.  Home blood sugar records: patient test one time a dayHe has been to diabetes education already. Current symptoms/problems none Daily foot checks:yes   Any foot concerns:He does have onychomycosis and has seen some improvement with the use of Lamisil Exercise: walking x 4 days about one hour Eye: 08/2014 has appointment 08/2015  The following portions of the patient's history were reviewed and updated as appropriate: allergies, current medications, past medical history, past social history and problem list.  ROS as in subjective above.     Objective:    Physical Exam Alert and in no distress ;Exam of his toenails does show interval clearing at the base.  Lab Review Diabetic Labs Latest Ref Rng 03/23/2015 12/27/2014 08/29/2014 08/28/2014 08/23/2014  HbA1c - 5.8 5.7 - - 7.2  Microalbumin 0.0-1.9 mg/dL - - - - -  Micro/Creat Ratio 0.0-30.0 mg/g - - - - -  Chol 0 - 200 mg/dL - - - - 115  HDL >39 mg/dL - - - - 33(L)  Calc LDL 0 - 99 mg/dL - - - - 70  Triglycerides <150 mg/dL - - - - 62  Creatinine 0.50 - 1.35 mg/dL 1.22 - 1.09 1.13 -  GFR >60.00 mL/min - - - - -   BP/Weight 03/23/2015 12/27/2014 11/10/2014 09/01/2014 94/70/9628  Systolic BP 366 294 - 765 -  Diastolic BP 90 88 - 85 -  Wt. (Lbs) 298.4 281 287.2 - 295.2  BMI 37.3 35.12 35.9 - 36.9   HbA1C 6.0  [name]  reports that he has never smoked. He does not have any smokeless tobacco history on file. He reports that he does not drink alcohol or use illicit drugs.     Assessment & Plan:    Diabetes mellitus without complication - Plan: POCT UA - Microalbumin, POCT glycosylated hemoglobin (Hb A1C), Amb Referral to Nutrition and Diabetic E, metFORMIN (GLUCOPHAGE) 1000 MG tablet, CBC with Differential/Platelet, Comprehensive metabolic panel, Lipid  panel  Onychomycosis - Plan: terbinafine (LAMISIL) 250 MG tablet  Need for prophylactic vaccination and inoculation against influenza - Plan: Flu Vaccine QUAD 36+ mos PF IM (Fluarix & Fluzone Quad PF)  Obesity (BMI 30-39.9) - Plan: Amb Referral to Nutrition and Diabetic E  Hypertension associated with diabetes - Plan: losartan-hydrochlorothiazide (HYZAAR) 100-12.5 MG per tablet, metoprolol succinate (TOPROL-XL) 100 MG 24 hr tablet, amLODipine (NORVASC) 10 MG tablet, CBC with Differential/Platelet, Comprehensive metabolic panel, Lipid panel  Mixed hyperlipidemia due to type 2 diabetes mellitus - Plan: atorvastatin (LIPITOR) 20 MG tablet, Lipid panel   1. Rx changes: Lamisil refilled as well as his other medications. 2. Education: Reviewed 'ABCs' of diabetes management (respective goals in parentheses):  A1C (<7), blood pressure (<130/80), and cholesterol (LDL <100). 3. Compliance at present is estimated to be good. Efforts to improve compliance (if necessary) will be directed at dietary modifications: referral to dietitian. 4. Follow up: 4 months I discussed the fact that since he seems to be exercising fairly regularly, we should look at his diet. He is comfortable with that.

## 2015-07-25 NOTE — Telephone Encounter (Signed)
Refilled all meds to [facility] pharmacy. Pt wife states that PPOF is something for the pharmacy to call the pt before a med is sent out to patient about the cost.

## 2015-07-28 ENCOUNTER — Telehealth: Payer: Self-pay | Admitting: Internal Medicine

## 2015-07-28 DIAGNOSIS — E782 Mixed hyperlipidemia: Secondary | ICD-10-CM

## 2015-07-28 DIAGNOSIS — E1159 Type 2 diabetes mellitus with other circulatory complications: Secondary | ICD-10-CM

## 2015-07-28 DIAGNOSIS — E1169 Type 2 diabetes mellitus with other specified complication: Secondary | ICD-10-CM

## 2015-07-28 DIAGNOSIS — E119 Type 2 diabetes mellitus without complications: Secondary | ICD-10-CM

## 2015-07-28 DIAGNOSIS — I152 Hypertension secondary to endocrine disorders: Secondary | ICD-10-CM

## 2015-07-28 DIAGNOSIS — I10 Essential (primary) hypertension: Principal | ICD-10-CM

## 2015-07-28 MED ORDER — ATORVASTATIN CALCIUM 20 MG PO TABS: 20.0000 mg | ORAL_TABLET | Freq: Every day | ORAL | Status: DC

## 2015-07-28 MED ORDER — METOPROLOL SUCCINATE ER 100 MG PO TB24: ORAL_TABLET | ORAL | Status: DC

## 2015-07-28 MED ORDER — METFORMIN HCL 1000 MG PO TABS: 1000.0000 mg | ORAL_TABLET | Freq: Two times a day (BID) | ORAL | Status: DC

## 2015-07-28 MED ORDER — AMLODIPINE BESYLATE 10 MG PO TABS: 10.0000 mg | ORAL_TABLET | Freq: Every day | ORAL | Status: DC

## 2015-07-28 MED ORDER — LOSARTAN POTASSIUM-HCTZ 100-12.5 MG PO TABS: 1.0000 | ORAL_TABLET | Freq: Every day | ORAL | Status: DC

## 2015-07-28 NOTE — Telephone Encounter (Signed)
Sent all 5 meds to [facility] and also sent all 5 meds for a [facility] day supply [facility].

## 2015-08-28 ENCOUNTER — Other Ambulatory Visit: Payer: Self-pay

## 2015-08-28 ENCOUNTER — Telehealth: Payer: Self-pay

## 2015-08-28 DIAGNOSIS — E782 Mixed hyperlipidemia: Secondary | ICD-10-CM

## 2015-08-28 DIAGNOSIS — I152 Hypertension secondary to endocrine disorders: Secondary | ICD-10-CM

## 2015-08-28 DIAGNOSIS — E1169 Type 2 diabetes mellitus with other specified complication: Secondary | ICD-10-CM

## 2015-08-28 DIAGNOSIS — I10 Essential (primary) hypertension: Principal | ICD-10-CM

## 2015-08-28 DIAGNOSIS — E1159 Type 2 diabetes mellitus with other circulatory complications: Secondary | ICD-10-CM

## 2015-08-28 MED ORDER — LOSARTAN POTASSIUM-HCTZ 100-12.5 MG PO TABS: 1.0000 | ORAL_TABLET | Freq: Every day | ORAL | Status: DC

## 2015-08-28 MED ORDER — ATORVASTATIN CALCIUM 20 MG PO TABS: 20.0000 mg | ORAL_TABLET | Freq: Every day | ORAL | Status: DC

## 2015-08-28 MED ORDER — AMLODIPINE BESYLATE 10 MG PO TABS: 10.0000 mg | ORAL_TABLET | Freq: Every day | ORAL | Status: DC

## 2015-08-28 MED ORDER — METOPROLOL SUCCINATE ER 100 MG PO TB24: ORAL_TABLET | ORAL | Status: DC

## 2015-08-28 NOTE — Telephone Encounter (Signed)
Needs refills on Metoprolol 100mg  #30, Losartan 100-12.5mg  #30, Amlodipine 10mg  #30, Atorvastatin 20mg  #30. He normally uses the [facility], but he said he is out of his meds and if he uses the [place] it won't be here for another 7 days. [facility]

## 2015-08-28 NOTE — Telephone Encounter (Signed)
Vincent Cantu I have already taken care of this.

## 2015-08-31 ENCOUNTER — Ambulatory Visit (INDEPENDENT_AMBULATORY_CARE_PROVIDER_SITE_OTHER): Payer: Managed Care, Other (non HMO) | Admitting: Family Medicine

## 2015-08-31 ENCOUNTER — Encounter: Payer: Self-pay | Admitting: Family Medicine

## 2015-08-31 VITALS — BP 130/98 | HR 76 | Temp 97.7°F | Ht 75.0 in | Wt 317.0 lb

## 2015-08-31 DIAGNOSIS — J069 Acute upper respiratory infection, unspecified: Secondary | ICD-10-CM
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
## 2015-08-31 NOTE — Patient Instructions (Signed)
  Drink plenty of fluids. Use antihistamines to help dry up the runny nose and limit the drainage down the throat and into the chest (ie Claritin, Allegra, Zyrtec or Coricidin HBP)--these are safe and won't raise your blood pressure (don't get the "D" versions of these medications.). Expect that your might having increasing cough and chest congestion.  You can continue these medications, and add guaifenesin (found in Mucinex, Robitussin).  You can use the DM version of these medications if you are having significant cough--the DM is a cough suppressant. If not coughing much, a plain version is fine. Be sure to look at the ingredients so that you don't overlap any ingredients. You may continue to use tylenol or ibuprofen only if needed for pain or fever.

## 2015-09-08 LAB — HM DIABETES EYE EXAM

## 2015-10-16 ENCOUNTER — Other Ambulatory Visit: Payer: Self-pay | Admitting: Medical

## 2015-10-17 ENCOUNTER — Telehealth: Payer: Self-pay | Admitting: Medical

## 2015-10-17 NOTE — Telephone Encounter (Signed)
pls write it on script pad and I'll sign

## 2015-10-17 NOTE — Telephone Encounter (Signed)
[facility] that [facility] need switch to One Touch Ultra for insurance to pay for them.  I called pharmacy and they stated they need new order, can't take verbal.  Need order for meter, strips [facility]-scribed to [facility]

## 2015-10-19 NOTE — Telephone Encounter (Signed)
Faxed Rx for One Touch Meter [facility] strips [facility] [phone] Tests 3 x day and dx code E11.9

## 2015-11-24 ENCOUNTER — Ambulatory Visit: Payer: Self-pay | Admitting: Family Medicine

## 2016-01-04 ENCOUNTER — Ambulatory Visit (INDEPENDENT_AMBULATORY_CARE_PROVIDER_SITE_OTHER): Payer: Managed Care, Other (non HMO) | Admitting: Family Medicine

## 2016-01-04 ENCOUNTER — Encounter: Payer: Self-pay | Admitting: Family Medicine

## 2016-01-04 VITALS — BP 144/102 | HR 82 | Temp 98.2°F | Resp 14 | Ht 75.0 in | Wt 331.4 lb

## 2016-01-04 DIAGNOSIS — E782 Mixed hyperlipidemia: Secondary | ICD-10-CM

## 2016-01-04 DIAGNOSIS — I10 Essential (primary) hypertension: Secondary | ICD-10-CM | POA: Diagnosis not present

## 2016-01-04 DIAGNOSIS — E1169 Type 2 diabetes mellitus with other specified complication: Secondary | ICD-10-CM

## 2016-01-04 DIAGNOSIS — E119 Type 2 diabetes mellitus without complications: Secondary | ICD-10-CM

## 2016-01-04 DIAGNOSIS — E1159 Type 2 diabetes mellitus with other circulatory complications: Secondary | ICD-10-CM

## 2016-01-04 DIAGNOSIS — I152 Hypertension secondary to endocrine disorders: Secondary | ICD-10-CM

## 2016-01-04 DIAGNOSIS — E118 Type 2 diabetes mellitus with unspecified complications: Secondary | ICD-10-CM | POA: Diagnosis not present

## 2016-01-04 LAB — POCT GLYCOSYLATED HEMOGLOBIN (HGB A1C): Hemoglobin A1C: 6.4

## 2016-01-04 NOTE — Progress Notes (Signed)
  Subjective:    Patient ID: Vincent Cantu, male    DOB: Apr 18, 1971, 45 y.o.   MRN: [id number]  Vincent Cantu is a 45 y.o. male who presents for follow-up of Type 2 diabetes mellitus.  Patient is checking at home blood sugars.   Home blood sugar records: BGs consistently in an acceptable range How often is blood sugars being checked: 2 times a day Current symptoms/problems include none and have been stable. Daily foot checks: yes   Any foot concerns: no Last eye exam: 10/2015 Exercise: Gym/ health club routine includes walking on track . He recognizes he needs to prove on his diet The following portions of the patient's history were reviewed and updated as appropriate: allergies, current medications, past medical history, past social history and problem list.  ROS as in subjective above.     Objective:    Physical Exam Alert and in no distress otherwise not examined.  Blood pressure 144/102, pulse 82, temperature 98.2 F (36.8 C), temperature source Oral, resp. rate 14, height 6\' 3"  (1.905 m), weight 331 lb 6.4 oz (150.322 kg).  Lab Review Diabetic Labs Latest Ref Rng 01/04/2016 07/25/2015 03/23/2015 12/27/2014 08/29/2014  HbA1c - 6.4 6.0 5.8 5.7 -  Chol 125 - 200 mg/dL - 132 - - -  HDL >=40 mg/dL - 30(L) - - -  Calc LDL <130 mg/dL - 85 - - -  Triglycerides <150 mg/dL - 83 - - -  Creatinine 0.60 - 1.35 mg/dL - 1.18 1.22 - 1.09  GFR >60.00 mL/min - - - - -   BP/Weight 01/04/2016 08/31/2015 07/25/2015 [id number]  Systolic BP 123456 [id number]  Diastolic BP [id number] 98 90 90 88  Wt. (Lbs) 331.4 317 311 298.4 281  BMI 41.42 39.62 38.87 37.3 35.12  Hemoglobin A1c is 6.4 [place]  reports that he has never smoked. He has never used smokeless tobacco. He reports that he does not drink alcohol or use illicit drugs.     Assessment & Plan:    Type 2 diabetes mellitus with complication, without long-term current use of insulin (HCC) - Plan: Amb Referral to Nutrition and Diabetic  E  Diabetes mellitus without complication ([place]) - Plan: POCT glycosylated hemoglobin (Hb A1C)  Mixed hyperlipidemia due to type 2 diabetes mellitus ([place])  Morbid obesity due to excess calories ([place]) - Plan: Amb Referral to Nutrition and Diabetic E  Hypertension associated with diabetes ([place])   Rx changes: none It's deathly time to get a tuneup on his diet since his weight is going up. He is comfortable with this.  Education: Reviewed 'ABCs' of diabetes management (respective goals in parentheses):  A1C (<7), blood pressure (<130/80), and cholesterol (LDL <100).  Compliance at present is estimated to be good. Efforts to improve compliance (if necessary) will be directed at increased exercise.  Follow up: 4 months

## 2016-01-04 NOTE — Patient Instructions (Signed)
Cut back on white food but make sure you get your lean meats vegetable fats and carbs Up with your walking

## 2016-02-08 ENCOUNTER — Ambulatory Visit (INDEPENDENT_AMBULATORY_CARE_PROVIDER_SITE_OTHER): Payer: Managed Care, Other (non HMO) | Admitting: Medical

## 2016-02-08 ENCOUNTER — Encounter: Payer: Self-pay | Admitting: Medical

## 2016-02-08 VITALS — BP 168/118 | HR 92 | Wt 328.0 lb

## 2016-02-08 DIAGNOSIS — I10 Essential (primary) hypertension: Secondary | ICD-10-CM

## 2016-02-08 DIAGNOSIS — R0683 Snoring: Secondary | ICD-10-CM | POA: Diagnosis not present

## 2016-02-08 DIAGNOSIS — R400 Somnolence: Secondary | ICD-10-CM

## 2016-02-08 DIAGNOSIS — G4710 Hypersomnia, unspecified: Secondary | ICD-10-CM | POA: Diagnosis not present

## 2016-02-08 MED ORDER — AZILSARTAN-CHLORTHALIDONE 40-25 MG PO TABS: 1.0000 | ORAL_TABLET | Freq: Every day | ORAL | Status: DC

## 2016-02-08 NOTE — Progress Notes (Signed)
Subjective: Chief Complaint  Patient presents with  . blood pressure running high    180/110 yesterday. today bp was 147/114. having headaches.   Here for elevated BP.   Went to wellness clinic at work yesterday, BP was quite elevated and he has been having headaches.    Denies chest pain, palpitations, edema, SOB, dizziness.   Some slight headache recently.   Doesn't add salt to food.  Wife says he used to snore.   No witnessed apnea.  Feels rested daily, but does get sleepy in the day.  No prior cardiology eval.  Last EKG on file here 2010.   Checks BP occasionally, but gets moderately elevated numbers.  He is not sure why the BPs are staying elevated.  No other aggravating or relieving factors. No other complaint.  Past Medical History  Diagnosis Date  . Hyperlipidemia   . Hypertension   . Diabetes mellitus without complication ([place]) 0000000   Family History  Problem Relation Age of Onset  . Hypertension Other   . Seizures Other   . Diabetes Other     with renal dz  . Diabetes Mother   . Diabetes Maternal Grandmother   . Hypertension Father     ROS as in subjective  Objective: BP 168/118 mmHg  Pulse 92  Wt 328 lb (148.78 kg)  General appearance: alert, no distress, WD/WN Oral cavity: MMM, no lesions Neck: supple, no lymphadenopathy, no thyromegaly, no masses, no bruits Heart: RRR, normal S1, S2, no murmurs Lungs: CTA bilaterally, no wheezes, rhonchi, or rales Abdomen: +bs, soft, non tender, non distended, no masses, no hepatomegaly, no splenomegaly, no bruits Pulses: 2+ symmetric, upper and lower extremities, normal cap refill Ext: no edema    Adult ECG Report  Indication: elevated BP  Rate: 82 bpm  Rhythm: normal sinus rhythm  QRS Axis: -4 degrees  PR Interval: 172ms  QRS Duration: 131ms  QTc: 452ms  Conduction Disturbances: none  Other Abnormalities: T wave inversion III, V6  Patient's cardiac risk factors are: dyslipidemia, hypertension, male gender and  obesity (BMI >= 30 kg/m2).  EKG comparison: none  Narrative Interpretation: nonspecific T wave abnormality     Assessment: Encounter Diagnoses  Name Primary?  . Essential hypertension Yes  . Snoring   . Daytime somnolence      Plan: C/t Norvasc 10mg  daily, Toprol 100mg  XL daily, and stop losartan HCT, and change to Edarbychlor to see if this gives better control.   We discussed possibly getting sleep study and stress echo.   He will check insurance coverage.    Labs today.  F/u pending labs, call back.

## 2016-02-09 ENCOUNTER — Ambulatory Visit: Payer: Managed Care, Other (non HMO) | Admitting: Family Medicine

## 2016-02-09 ENCOUNTER — Telehealth: Payer: Self-pay

## 2016-02-09 LAB — BASIC METABOLIC PANEL
BUN: 11 mg/dL (ref 7–25)
CHLORIDE: 105 mmol/L (ref 98–110)
CO2: 22 mmol/L (ref 20–31)
CREATININE: 1.39 mg/dL — AB (ref 0.60–1.35)
Calcium: 9.8 mg/dL (ref 8.6–10.3)
GLUCOSE: 88 mg/dL (ref 65–99)
POTASSIUM: 4.1 mmol/L (ref 3.5–5.3)
Sodium: 139 mmol/L (ref 135–146)

## 2016-02-09 NOTE — Telephone Encounter (Signed)
Pt called for verification on his medications went over with him that he is stopping losartan and beginning edarbychlor. That was the only medication change

## 2016-03-15 ENCOUNTER — Telehealth: Payer: Self-pay | Admitting: Family Medicine

## 2016-03-15 DIAGNOSIS — E119 Type 2 diabetes mellitus without complications: Secondary | ICD-10-CM

## 2016-03-15 MED ORDER — METFORMIN HCL 1000 MG PO TABS: 1000.0000 mg | ORAL_TABLET | Freq: Two times a day (BID) | ORAL | Status: DC

## 2016-03-15 NOTE — Telephone Encounter (Signed)
Pt called requesting a refill on his metformin pt [id number] [place] [facility] [place].

## 2016-03-15 NOTE — Telephone Encounter (Signed)
Done

## 2016-04-14 ENCOUNTER — Other Ambulatory Visit: Payer: Self-pay | Admitting: Medical

## 2016-05-22 ENCOUNTER — Ambulatory Visit: Payer: Managed Care, Other (non HMO) | Admitting: Family Medicine

## 2016-05-27 ENCOUNTER — Other Ambulatory Visit: Payer: Self-pay | Admitting: Medical

## 2016-05-31 ENCOUNTER — Other Ambulatory Visit: Payer: Self-pay | Admitting: Medical

## 2016-06-18 ENCOUNTER — Other Ambulatory Visit: Payer: Self-pay | Admitting: Family Medicine

## 2016-07-10 ENCOUNTER — Ambulatory Visit (INDEPENDENT_AMBULATORY_CARE_PROVIDER_SITE_OTHER): Payer: Managed Care, Other (non HMO) | Admitting: Family Medicine

## 2016-07-10 ENCOUNTER — Encounter: Payer: Self-pay | Admitting: Family Medicine

## 2016-07-10 VITALS — BP 114/70 | HR 83 | Ht 75.0 in | Wt 320.0 lb

## 2016-07-10 DIAGNOSIS — E118 Type 2 diabetes mellitus with unspecified complications: Secondary | ICD-10-CM

## 2016-07-10 DIAGNOSIS — E1159 Type 2 diabetes mellitus with other circulatory complications: Secondary | ICD-10-CM | POA: Diagnosis not present

## 2016-07-10 DIAGNOSIS — I10 Essential (primary) hypertension: Secondary | ICD-10-CM | POA: Diagnosis not present

## 2016-07-10 DIAGNOSIS — E782 Mixed hyperlipidemia: Secondary | ICD-10-CM

## 2016-07-10 DIAGNOSIS — Z23 Encounter for immunization: Secondary | ICD-10-CM

## 2016-07-10 DIAGNOSIS — E1169 Type 2 diabetes mellitus with other specified complication: Secondary | ICD-10-CM | POA: Diagnosis not present

## 2016-07-10 LAB — COMPREHENSIVE METABOLIC PANEL
ALBUMIN: 4.1 g/dL (ref 3.6–5.1)
ALT: 37 U/L (ref 9–46)
AST: 35 U/L (ref 10–40)
Alkaline Phosphatase: 93 U/L (ref 40–115)
BUN: 18 mg/dL (ref 7–25)
CALCIUM: 9.5 mg/dL (ref 8.6–10.3)
CHLORIDE: 107 mmol/L (ref 98–110)
CO2: 20 mmol/L (ref 20–31)
Creat: 1.51 mg/dL — ABNORMAL HIGH (ref 0.60–1.35)
Glucose, Bld: 88 mg/dL (ref 65–99)
Potassium: 3.7 mmol/L (ref 3.5–5.3)
Sodium: 140 mmol/L (ref 135–146)
Total Bilirubin: 0.5 mg/dL (ref 0.2–1.2)
Total Protein: 7.8 g/dL (ref 6.1–8.1)

## 2016-07-10 LAB — CBC WITH DIFFERENTIAL/PLATELET
BASOS ABS: 0 {cells}/uL (ref 0–200)
Basophils Relative: 0 %
EOS PCT: 1 %
Eosinophils Absolute: 63 cells/uL (ref 15–500)
HEMATOCRIT: 36.9 % — AB (ref 38.5–50.0)
HEMOGLOBIN: 12.5 g/dL — AB (ref 13.2–17.1)
LYMPHS ABS: 3024 {cells}/uL (ref 850–3900)
Lymphocytes Relative: 48 %
MCH: 26.9 pg — AB (ref 27.0–33.0)
MCHC: 33.9 g/dL (ref 32.0–36.0)
MCV: 79.5 fL — ABNORMAL LOW (ref 80.0–100.0)
MPV: 11.3 fL (ref 7.5–12.5)
Monocytes Absolute: 756 cells/uL (ref 200–950)
Monocytes Relative: 12 %
Neutro Abs: 2457 cells/uL (ref 1500–7800)
Neutrophils Relative %: 39 %
Platelets: 260 10*3/uL (ref 140–400)
RBC: 4.64 MIL/uL (ref 4.20–5.80)
RDW: 14.7 % (ref 11.0–15.0)
WBC: 6.3 10*3/uL (ref 4.0–10.5)

## 2016-07-10 LAB — LIPID PANEL
CHOL/HDL RATIO: 5.1 ratio — AB (ref <=5.0)
Cholesterol: 128 mg/dL (ref 125–200)
HDL: 25 mg/dL — AB (ref >=40)
LDL CALC: 72 mg/dL (ref <130)
TRIGLYCERIDES: 153 mg/dL — AB (ref <150)
VLDL: 31 mg/dL — AB (ref <30)

## 2016-07-10 LAB — POCT GLYCOSYLATED HEMOGLOBIN (HGB A1C): Hemoglobin A1C: 6.2

## 2016-07-10 LAB — POCT UA - MICROALBUMIN
Albumin/Creatinine Ratio, Urine, POC: 15.2
Creatinine, POC: 200.6 mg/dL
Microalbumin Ur, POC: 30.4 mg/L

## 2016-07-10 MED ORDER — METFORMIN HCL 1000 MG PO TABS: 1000.0000 mg | ORAL_TABLET | Freq: Two times a day (BID) | ORAL | 1 refills | Status: DC

## 2016-07-10 MED ORDER — AMLODIPINE BESYLATE 10 MG PO TABS: 10.0000 mg | ORAL_TABLET | Freq: Every day | ORAL | 3 refills | Status: DC

## 2016-07-10 MED ORDER — METOPROLOL SUCCINATE ER 100 MG PO TB24: ORAL_TABLET | ORAL | 3 refills | Status: DC

## 2016-07-10 MED ORDER — AZILSARTAN-CHLORTHALIDONE 40-25 MG PO TABS: 1.0000 | ORAL_TABLET | Freq: Every day | ORAL | 11 refills | Status: DC

## 2016-07-10 NOTE — Progress Notes (Signed)
  Subjective:    Patient ID: Vincent Cantu, male    DOB: 11-13-1971, 45 y.o.   MRN: [id number]  Vincent Cantu is a 45 y.o. male who presents for follow-up of Type 2 diabetes mellitus.  Patient is checking home blood sugars.   Home blood sugar records: 79 to 150 How often is blood sugars being checked: once every three days Current symptoms/problems none Daily foot checks: yes   Any foot concerns: none Last eye exam:09/07/15 Exercise: walking 7 days a week 1 hour a day He admits to not making many changes in his diet. He continues on medications listed in the chart. The following portions of the patient's history were reviewed and updated as appropriate: allergies, current medications, past medical history, past social history and problem list.  ROS as in subjective above.     Objective:    Physical Exam Alert and in no distress otherwise not examined.   Lab Review Diabetic Labs Latest Ref Rng & Units 02/08/2016 01/04/2016 07/25/2015 03/23/2015 12/27/2014  HbA1c - - 6.4 6.0 5.8 5.7  Microalbumin 0.0 - 1.9 mg/dL - - - - -  Micro/Creat Ratio 0.0 - 30.0 mg/g - - - - -  Chol 125 - 200 mg/dL - - 132 - -  HDL >=40 mg/dL - - 30(L) - -  Calc LDL <130 mg/dL - - 85 - -  Triglycerides <150 mg/dL - - 83 - -  Creatinine 0.60 - 1.35 mg/dL 1.39(H) - 1.18 1.22 -  GFR >60.00 mL/min - - - - -   BP/Weight 02/08/2016 01/04/2016 08/31/2015 [id number]  Systolic BP [id number] 123456 [id number]  Diastolic BP 123456 A999333 98 90 90  Wt. (Lbs) 328 331.4 317 311 298.4  BMI 41 41.42 39.62 38.87 37.3  Vincent Cantu  reports that he has never smoked. He has never used smokeless tobacco. He reports that he does not drink alcohol or use drugs. Hemoglobin A1c is 6.2    Assessment & Plan:    Need for prophylactic vaccination and inoculation against influenza - Plan: Flu Vaccine QUAD 36+ mos IM  Morbid obesity due to excess calories ([place])  Hypertension associated with diabetes ([place]) - Plan: Azilsartan-Chlorthalidone  (EDARBYCLOR) 40-25 MG TABS, CBC with Differential/Platelet, Comprehensive metabolic panel, amLODipine (NORVASC) 10 MG tablet, metoprolol succinate (TOPROL-XL) 100 MG 24 hr tablet  Mixed hyperlipidemia due to type 2 diabetes mellitus ([place]) - Plan: Lipid panel  Type 2 diabetes mellitus with complication, without long-term current use of insulin ([place]) - Plan: POCT glycosylated hemoglobin (Hb A1C), CBC with Differential/Platelet, Comprehensive metabolic panel, Lipid panel, POCT UA - Microalbumin   1. Rx changes: none 2. Education: Reviewed 'ABCs' of diabetes management (respective goals in parentheses):  A1C (<7), blood pressure (<130/80), and cholesterol (LDL <100). 3. Compliance at present is estimated to be excellent. Efforts to improve compliance (if necessary) will be directed at dietary modifications: Cut back on carbs. 4. Follow up: 4 months Again encouraged him to continue with his exercise program and also discussed getting his waist size down to under 40.

## 2016-08-08 LAB — HM COLONOSCOPY

## 2016-08-15 ENCOUNTER — Telehealth: Payer: Self-pay

## 2016-08-15 ENCOUNTER — Ambulatory Visit (INDEPENDENT_AMBULATORY_CARE_PROVIDER_SITE_OTHER): Payer: Managed Care, Other (non HMO) | Admitting: Family Medicine

## 2016-08-15 VITALS — HR 107 | Temp 98.8°F | Wt 321.0 lb

## 2016-08-15 DIAGNOSIS — K529 Noninfective gastroenteritis and colitis, unspecified: Secondary | ICD-10-CM

## 2016-08-15 NOTE — Progress Notes (Signed)
   Subjective:    Patient ID: Vincent Cantu, male    DOB: [birth year], 45 y.o.   MRN: [id number]  HPI He complains of a one-day history of fever, chills, abdominal pain and diarrhea. He states that he is slightly better today.   Review of Systems     Objective:   Physical Exam Alert and in no distress. Tympanic membranes and canals are normal. Pharyngeal area is normal. Neck is supple without adenopathy or thyromegaly. Cardiac exam shows a regular sinus rhythm without murmurs or gallops. Lungs are clear to auscultation. Abdominal exam shows no masses or tenderness with decreased bowel sounds        Assessment & Plan:  Acute gastroenteritis Recommend supportive care with plenty of fluids, Imodium as needed and recommend he eat anything he would like. Explained that there has been a viral GI bug going around recently.

## 2016-08-15 NOTE — Patient Instructions (Addendum)
Keep yourself hydrated. You can use Imodium for the diarrhea if it gets too much. Tylenol for the aches and pains. You can eat anything you want

## 2016-08-15 NOTE — Telephone Encounter (Signed)
Error

## 2016-09-11 ENCOUNTER — Other Ambulatory Visit: Payer: Self-pay | Admitting: Family Medicine

## 2016-09-11 DIAGNOSIS — E782 Mixed hyperlipidemia: Principal | ICD-10-CM

## 2016-09-11 DIAGNOSIS — I10 Essential (primary) hypertension: Secondary | ICD-10-CM

## 2016-09-11 DIAGNOSIS — E1169 Type 2 diabetes mellitus with other specified complication: Secondary | ICD-10-CM

## 2016-09-11 DIAGNOSIS — I152 Hypertension secondary to endocrine disorders: Secondary | ICD-10-CM

## 2016-09-11 DIAGNOSIS — E1159 Type 2 diabetes mellitus with other circulatory complications: Secondary | ICD-10-CM

## 2016-09-11 MED ORDER — AMLODIPINE BESYLATE 10 MG PO TABS: 10.0000 mg | ORAL_TABLET | Freq: Every day | ORAL | 0 refills | Status: DC

## 2016-10-02 ENCOUNTER — Encounter: Payer: Self-pay | Admitting: Family Medicine

## 2016-10-02 ENCOUNTER — Ambulatory Visit (INDEPENDENT_AMBULATORY_CARE_PROVIDER_SITE_OTHER): Payer: Managed Care, Other (non HMO) | Admitting: Family Medicine

## 2016-10-02 VITALS — BP 130/100 | HR 100 | Wt 322.0 lb

## 2016-10-02 DIAGNOSIS — G8929 Other chronic pain: Secondary | ICD-10-CM

## 2016-10-02 DIAGNOSIS — M545 Low back pain: Secondary | ICD-10-CM

## 2016-10-02 LAB — POCT URINALYSIS DIPSTICK
Bilirubin, UA: NEGATIVE
GLUCOSE UA: NEGATIVE
Ketones, UA: NEGATIVE
Leukocytes, UA: NEGATIVE
Nitrite, UA: NEGATIVE
PH UA: 6
Protein, UA: NEGATIVE
RBC UA: NEGATIVE
SPEC GRAV UA: 1.025
UROBILINOGEN UA: NEGATIVE

## 2016-10-02 NOTE — Progress Notes (Signed)
   Subjective:    Patient ID: Vincent Cantu, male    DOB: 1971-08-19, 45 y.o.   MRN: [id number]  HPI He is here for a follow-up visit. He was seen on September 28 and treated for GI symptoms. Several days after that he developed dysuria, temperature, back and leg discomfort. He did see Dr. Benson Norway thinking this was a GI related issue. Apparently urinalysis did show white blood cells and he was placed on a 6 week course of Cipro. He finished that several days ago. He states that his back and hip pain went from a pain level of 10 down to 7. 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 The pain is in either hip and sometimes rotates between the 2. It is worse with standing and with rotation. It does radiate down the posterior aspect of his thighs.   Review of Systems     Objective:   Physical Exam Alert and in no distress. Pain is elicited with rotation of the back especially leftward rotation. Negative straight leg raising. Normal lumbar motion. Scar noted over the lumbar area. DTRs normal.  In dipstick is negative.     Assessment & Plan:  Chronic low back pain, unspecified back pain laterality, with sciatica presence unspecified  Recommend conservative care on this proper posturing and anti-inflammatory pain medications. If he continues have difficulty, further intervention and possible referral will be made.

## 2016-11-13 ENCOUNTER — Ambulatory Visit (INDEPENDENT_AMBULATORY_CARE_PROVIDER_SITE_OTHER): Payer: Managed Care, Other (non HMO) | Admitting: Family Medicine

## 2016-11-13 ENCOUNTER — Encounter: Payer: Self-pay | Admitting: Family Medicine

## 2016-11-13 VITALS — BP 126/88 | HR 72 | Wt 325.4 lb

## 2016-11-13 DIAGNOSIS — I10 Essential (primary) hypertension: Secondary | ICD-10-CM | POA: Diagnosis not present

## 2016-11-13 DIAGNOSIS — E118 Type 2 diabetes mellitus with unspecified complications: Secondary | ICD-10-CM | POA: Diagnosis not present

## 2016-11-13 DIAGNOSIS — E1159 Type 2 diabetes mellitus with other circulatory complications: Secondary | ICD-10-CM

## 2016-11-13 DIAGNOSIS — E782 Mixed hyperlipidemia: Secondary | ICD-10-CM | POA: Diagnosis not present

## 2016-11-13 DIAGNOSIS — E1169 Type 2 diabetes mellitus with other specified complication: Secondary | ICD-10-CM

## 2016-11-13 DIAGNOSIS — M461 Sacroiliitis, not elsewhere classified: Secondary | ICD-10-CM | POA: Diagnosis not present

## 2016-11-13 LAB — POCT GLYCOSYLATED HEMOGLOBIN (HGB A1C): Hemoglobin A1C: 6.1

## 2016-11-13 NOTE — Progress Notes (Signed)
Subjective:    Patient ID: Vincent Cantu, male    DOB: [birth year], 45 y.o.   MRN: [id number]  Vincent Cantu is a 45 y.o. male who presents for follow-up of Type 2 diabetes mellitus.  Patient is checking home blood sugars.   Home blood sugar records: 80 to 190 How often is blood sugars being checked: BID Current symptoms/problems none Daily foot checks: yes   Any foot concerns: none Last eye exam: 11/2016 Exercise: walking 7 days a week one hour at a time He admits to poor eating habits, getting a lot of fast foods stating it is difficult to cook at night when they get home due to fatigue and working all day. He continues on his blood pressure medications as well as metformin and statin. He's having no difficulty with them. He does have difficulty with bilateral back pain especially if he stands for a period time. He has been using Aleve periodically. The following portions of the patient's history were reviewed and updated as appropriate: allergies, current medications, past medical history, past social history and problem list.  ROS as in subjective above.     Objective:    Physical Exam Alert and in no distress.Back exam shows bilateral upper SI joint tenderness with provocative testing causing discomfort. Negative straight leg raising. Normal hip motion.   Lab Review Diabetic Labs Latest Ref Rng & Units 07/10/2016 02/08/2016 01/04/2016 07/25/2015 03/23/2015  HbA1c - 6.2 - 6.4 6.0 5.8  Microalbumin mg/L 30.4 - - 33.3 -  Micro/Creat Ratio - 15.2 - - 15.2 -  Chol 125 - 200 mg/dL 128 - - 132 -  HDL >=40 mg/dL 25(L) - - 30(L) -  Calc LDL <130 mg/dL 72 - - 85 -  Triglycerides <150 mg/dL 153(H) - - 83 -  Creatinine 0.60 - 1.35 mg/dL 1.51(H) 1.39(H) - 1.18 1.22  GFR >60.00 mL/min - - - - -   BP/Weight 10/02/2016 08/15/2016 07/10/2016 02/08/2016 [id number]  Systolic BP [id number] - [id number] 123456  Diastolic BP 123XX123 - 70 123456 [id number]  Wt. (Lbs) 322 321 320 328 331.4  BMI 40.25 40.12 40 41 41.42    Foot/eye exam completion dates Latest Ref Rng & Units 09/08/2015  Eye Exam No Retinopathy No Retinopathy  Foot Form Completion - -  A1c is 6.1  Vincent Cantu  reports that he has never smoked. He has never used smokeless tobacco. He reports that he does not drink alcohol or use drugs.     Assessment & Plan:    Type 2 diabetes mellitus with complication, without long-term current use of insulin (HCC)  Sacroiliitis (HCC)  Morbid obesity due to excess calories ([place])  Hypertension associated with diabetes ([place])  Mixed hyperlipidemia due to type 2 diabetes mellitus ([place])  Morbid obesity ([place])   1. Rx changes: none 2. Education: Reviewed 'ABCs' of diabetes management (respective goals in parentheses):  A1C (<7), blood pressure (<130/80), and cholesterol (LDL <100). 3. Compliance at present is estimated to be fair. Efforts to improve compliance (if necessary) will be directed at dietary modifications: Encouraged choosing different places to get his evening meals and potentially even cooking meals on the weekend to be used during the week.. 4. Follow up: 4 months Also discussed proper back care in terms of heat, stretching, range of motion exercises and 2 Aleve twice per day. If he has continued difficulty with the back pain he is to call and I will give a referral to physical therapy. Also again discussed weight loss  with him and the benefits it would have on his entire health including possibly being able to come off of medications.

## 2016-11-13 NOTE — Patient Instructions (Signed)
Heat for 20 minutes then stretching after that and 2 Aleve twice per day. Do that for several weeks and if no improvement call

## 2016-11-26 LAB — HM DIABETES EYE EXAM

## 2016-12-02 ENCOUNTER — Encounter: Payer: Self-pay | Admitting: Family Medicine

## 2016-12-11 ENCOUNTER — Other Ambulatory Visit: Payer: Self-pay | Admitting: Family Medicine

## 2016-12-11 DIAGNOSIS — E782 Mixed hyperlipidemia: Principal | ICD-10-CM

## 2016-12-11 DIAGNOSIS — E1169 Type 2 diabetes mellitus with other specified complication: Secondary | ICD-10-CM

## 2017-01-29 ENCOUNTER — Other Ambulatory Visit: Payer: Self-pay | Admitting: Family Medicine

## 2017-03-12 ENCOUNTER — Other Ambulatory Visit: Payer: Self-pay

## 2017-03-12 ENCOUNTER — Encounter: Payer: Self-pay | Admitting: Family Medicine

## 2017-03-12 ENCOUNTER — Ambulatory Visit (INDEPENDENT_AMBULATORY_CARE_PROVIDER_SITE_OTHER): Payer: Managed Care, Other (non HMO) | Admitting: Family Medicine

## 2017-03-12 VITALS — BP 136/88 | HR 78 | Ht 75.0 in | Wt 336.0 lb

## 2017-03-12 DIAGNOSIS — E782 Mixed hyperlipidemia: Secondary | ICD-10-CM

## 2017-03-12 DIAGNOSIS — E1169 Type 2 diabetes mellitus with other specified complication: Secondary | ICD-10-CM | POA: Diagnosis not present

## 2017-03-12 DIAGNOSIS — E1159 Type 2 diabetes mellitus with other circulatory complications: Secondary | ICD-10-CM | POA: Diagnosis not present

## 2017-03-12 DIAGNOSIS — I10 Essential (primary) hypertension: Secondary | ICD-10-CM | POA: Diagnosis not present

## 2017-03-12 DIAGNOSIS — E118 Type 2 diabetes mellitus with unspecified complications: Secondary | ICD-10-CM | POA: Diagnosis not present

## 2017-03-12 LAB — POCT GLYCOSYLATED HEMOGLOBIN (HGB A1C): HEMOGLOBIN A1C: 6.7

## 2017-03-12 MED ORDER — METOPROLOL SUCCINATE ER 200 MG PO TB24: 200.0000 mg | ORAL_TABLET | Freq: Every day | ORAL | 3 refills | Status: DC

## 2017-03-12 MED ORDER — GLUCOSE BLOOD VI STRP: 1.0000 | ORAL_STRIP | Freq: Every day | 3 refills | Status: DC

## 2017-03-12 MED ORDER — TERAZOSIN HCL 1 MG PO CAPS: 1.0000 mg | ORAL_CAPSULE | Freq: Every day | ORAL | 1 refills | Status: DC

## 2017-03-12 NOTE — Progress Notes (Signed)
Subjective:    Patient ID: Vincent Cantu, male    DOB: Dec 22, 1970, 46 y.o.   MRN: [id number]  Vincent Cantu is a 46 y.o. male who presents for follow-up of Type 2 diabetes mellitus.  Patient has not checking home blood sugars.  He said he ran out of strips. Home blood sugar records: hi 180 low 90 How often is blood sugars being checked: once a day Current symptoms/problem none Daily foot checks: yes  Any foot concerns: none Last eye exam: 11/26/16 Exercise: walking goes to gym He admits to having poor eating habits stating that when he gets home he and his wife usually grab whatever is quick and easy. He continues on his blood pressure medication and is having no difficulty with that. He also takes Lipitor and has no muscle aches or pains.  The following portions of the patient's history were reviewed and updated as appropriate: allergies, current medications, past medical history, past social history and problem list.  ROS as in subjective above.     Objective:    Physical Exam Alert and in no distress otherwise not examined.    Lab Review Diabetic Labs Latest Ref Rng & Units 03/12/2017 11/13/2016 07/10/2016 02/08/2016 01/04/2016  HbA1c - 6.7 6.1 6.2 - 6.4  Microalbumin mg/L - - 30.4 - -  Micro/Creat Ratio - - - 15.2 - -  Chol 125 - 200 mg/dL - - 128 - -  HDL >=40 mg/dL - - 25(L) - -  Calc LDL <130 mg/dL - - 72 - -  Triglycerides <150 mg/dL - - 153(H) - -  Creatinine 0.60 - 1.35 mg/dL - - 1.51(H) 1.39(H) -  GFR >60.00 mL/min - - - - -   BP/Weight 03/12/2017 11/13/2016 10/02/2016 08/15/2016 1/60/1093  Systolic BP 235 573 220 - 254  Diastolic BP 88 88 270 - 70  Wt. (Lbs) 336 325.4 322 321 320  BMI 42 40.67 40.25 40.12 40   Foot/eye exam completion dates Latest Ref Rng & Units 11/26/2016 09/08/2015  Eye Exam No Retinopathy No Retinopathy No Retinopathy  Foot Form Completion - - -  A1c is 6.7  Vincent Cantu  reports that he has never smoked. He has never used smokeless tobacco. He  reports that he does not drink alcohol or use drugs.     Assessment & Plan:    Type 2 diabetes mellitus with complication, without long-term current use of insulin (HCC) - Plan: HgB A1c, Amb Referral to Nutrition and Diabetic E  Morbid obesity due to excess calories ([place]) - Plan: Amb Referral to Nutrition and Diabetic E  Hypertension associated with diabetes ([place]) - Plan: metoprolol (TOPROL XL) 200 MG 24 hr tablet, DISCONTINUED: terazosin (HYTRIN) 1 MG capsule  Mixed hyperlipidemia due to type 2 diabetes mellitus ([place])   1. Rx changes: I will increase his Toprol to 200 mg per day. He was not given Hytrin. That was canceled 2. Education: Reviewed 'ABCs' of diabetes management (respective goals in parentheses):  A1C (<7), blood pressure (<130/80), and cholesterol (LDL <100). 3. Compliance at present is estimated to be fair. Efforts to improve compliance (if necessary) will be directed at dietary modifications: Referral to diabetes and nutrition. 4. Follow up: 4 months The majority of the time was spent discussing diet with him. He seems to be fairly active especially at work as he is walking during his morning, lunch and afternoon break. Also discussed blood pressure in regard to his weight. Hopefully he will make further efforts in that and  we Cut back on his medication usage.

## 2017-03-26 ENCOUNTER — Telehealth: Payer: Self-pay

## 2017-03-26 ENCOUNTER — Other Ambulatory Visit: Payer: Self-pay

## 2017-03-26 DIAGNOSIS — I10 Essential (primary) hypertension: Principal | ICD-10-CM

## 2017-03-26 DIAGNOSIS — E1159 Type 2 diabetes mellitus with other circulatory complications: Secondary | ICD-10-CM

## 2017-03-26 MED ORDER — METOPROLOL SUCCINATE ER 200 MG PO TB24: 200.0000 mg | ORAL_TABLET | Freq: Every day | ORAL | 0 refills | Status: DC

## 2017-03-26 NOTE — Telephone Encounter (Signed)
Pt needs temporary order of metoprolol sent to [facility]. He is waiting on mail order to come in. Vincent Cantu

## 2017-03-26 NOTE — Telephone Encounter (Signed)
Sent in 30 days

## 2017-05-09 ENCOUNTER — Other Ambulatory Visit: Payer: Self-pay

## 2017-05-09 ENCOUNTER — Telehealth: Payer: Self-pay | Admitting: Family Medicine

## 2017-05-09 MED ORDER — METFORMIN HCL 1000 MG PO TABS: 1000.0000 mg | ORAL_TABLET | Freq: Two times a day (BID) | ORAL | 0 refills | Status: DC

## 2017-05-09 NOTE — Telephone Encounter (Signed)
Pt called and is requesting a refill om his metformin pt would [id number] [place], [place] [address] pt can be reached at [phone]

## 2017-05-09 NOTE — Telephone Encounter (Signed)
Sent in metformin 

## 2017-06-25 ENCOUNTER — Ambulatory Visit: Payer: Managed Care, Other (non HMO) | Admitting: Family Medicine

## 2017-07-07 ENCOUNTER — Telehealth: Payer: Self-pay | Admitting: Family Medicine

## 2017-07-07 ENCOUNTER — Other Ambulatory Visit: Payer: Self-pay | Admitting: Family Medicine

## 2017-07-07 DIAGNOSIS — E1159 Type 2 diabetes mellitus with other circulatory complications: Secondary | ICD-10-CM

## 2017-07-07 DIAGNOSIS — I10 Essential (primary) hypertension: Principal | ICD-10-CM

## 2017-07-07 NOTE — Telephone Encounter (Signed)
Pt left message needs refill Amlodipine 10mg  to [facility]

## 2017-07-08 NOTE — Telephone Encounter (Signed)
Med filled 07/08/2017

## 2017-07-24 ENCOUNTER — Other Ambulatory Visit: Payer: Self-pay | Admitting: Family Medicine

## 2017-07-24 DIAGNOSIS — E1159 Type 2 diabetes mellitus with other circulatory complications: Secondary | ICD-10-CM

## 2017-07-24 DIAGNOSIS — I10 Essential (primary) hypertension: Principal | ICD-10-CM

## 2017-07-24 DIAGNOSIS — I152 Hypertension secondary to endocrine disorders: Secondary | ICD-10-CM

## 2017-07-24 NOTE — Telephone Encounter (Signed)
Pt has appt 07/30/2017.

## 2017-07-25 ENCOUNTER — Other Ambulatory Visit: Payer: Self-pay | Admitting: Family Medicine

## 2017-07-25 DIAGNOSIS — E1159 Type 2 diabetes mellitus with other circulatory complications: Secondary | ICD-10-CM

## 2017-07-25 DIAGNOSIS — I10 Essential (primary) hypertension: Principal | ICD-10-CM

## 2017-07-29 ENCOUNTER — Other Ambulatory Visit: Payer: Self-pay | Admitting: Family Medicine

## 2017-07-29 DIAGNOSIS — I10 Essential (primary) hypertension: Principal | ICD-10-CM

## 2017-07-29 DIAGNOSIS — E1159 Type 2 diabetes mellitus with other circulatory complications: Secondary | ICD-10-CM

## 2017-07-29 DIAGNOSIS — I152 Hypertension secondary to endocrine disorders: Secondary | ICD-10-CM

## 2017-07-30 ENCOUNTER — Encounter: Payer: Self-pay | Admitting: Family Medicine

## 2017-07-30 ENCOUNTER — Ambulatory Visit (INDEPENDENT_AMBULATORY_CARE_PROVIDER_SITE_OTHER): Payer: Managed Care, Other (non HMO) | Admitting: Family Medicine

## 2017-07-30 VITALS — BP 130/80 | HR 75 | Resp 18 | Wt 338.6 lb

## 2017-07-30 DIAGNOSIS — E782 Mixed hyperlipidemia: Secondary | ICD-10-CM

## 2017-07-30 DIAGNOSIS — I10 Essential (primary) hypertension: Secondary | ICD-10-CM | POA: Diagnosis not present

## 2017-07-30 DIAGNOSIS — Z23 Encounter for immunization: Secondary | ICD-10-CM

## 2017-07-30 DIAGNOSIS — E118 Type 2 diabetes mellitus with unspecified complications: Secondary | ICD-10-CM

## 2017-07-30 DIAGNOSIS — E1169 Type 2 diabetes mellitus with other specified complication: Secondary | ICD-10-CM | POA: Diagnosis not present

## 2017-07-30 DIAGNOSIS — E1159 Type 2 diabetes mellitus with other circulatory complications: Secondary | ICD-10-CM

## 2017-07-30 LAB — POCT UA - MICROALBUMIN
ALBUMIN/CREATININE RATIO, URINE, POC: 12.3
CREATININE, POC: 323.8 mg/dL
MICROALBUMIN (UR) POC: 39.7 mg/L

## 2017-07-30 LAB — POCT GLYCOSYLATED HEMOGLOBIN (HGB A1C): Hemoglobin A1C: 6.8

## 2017-07-30 NOTE — Progress Notes (Signed)
  Subjective:    Patient ID: Vincent Cantu, male    DOB: 02-24-1971, 46 y.o.   MRN: [id number]  Vincent Cantu is a 46 y.o. male who presents for follow-up of Type 2 diabetes mellitus.  Home blood sugar records: fasting range: 90 Current symptoms/problems include none and have been unchanged. Daily foot checks:   Any foot concerns: none Exercise: Gym/ health club routine includes cardio. Diet:Low carb He is presently taking metformin and having no difficulty. He is also taking atorvastatin for his cholesterol and having no aches or pains. He is on amlodipine, metoprolol as well as Edarbyclor for his blood pressure. The following portions of the patient's history were reviewed and updated as appropriate: allergies, current medications, past medical history, past social history and problem list.  ROS as in subjective above.     Objective:    Physical Exam Alert and in no distress otherwise not examined.  Blood pressure 130/80, pulse 75, resp. rate 18, weight (!) 338 lb 9.6 oz (153.6 kg), SpO2 99 %.  Lab Review Diabetic Labs Latest Ref Rng & Units 07/30/2017 03/12/2017 11/13/2016 07/10/2016 02/08/2016  HbA1c - 6.8 6.7 6.1 6.2 -  Microalbumin mg/L 39.7 - - 30.4 -  Micro/Creat Ratio - 12.3 - - 15.2 -  Chol 125 - 200 mg/dL - - - 128 -  HDL >=40 mg/dL - - - 25(L) -  Calc LDL <130 mg/dL - - - 72 -  Triglycerides <150 mg/dL - - - 153(H) -  Creatinine 0.60 - 1.35 mg/dL - - - 1.51(H) 1.39(H)  GFR >60.00 mL/min - - - - -   BP/Weight 07/30/2017 03/12/2017 11/13/2016 10/02/2016 2/29/7989  Systolic BP 211 941 740 814 -  Diastolic BP 80 88 88 481 -  Wt. (Lbs) 338.6 336 325.4 322 321  BMI 42.32 42 40.67 40.25 40.12   Foot/eye exam completion dates Latest Ref Rng & Units 11/26/2016 09/08/2015  Eye Exam No Retinopathy No Retinopathy No Retinopathy  Foot Form Completion - - -  A1c 6.8  [name]  reports that he has never smoked. He has never used smokeless tobacco. He reports that he does not drink  alcohol or use drugs.     Assessment & Plan:    Mixed hyperlipidemia due to type 2 diabetes mellitus ([place]) - Plan: HgB A1c, POCT UA - Microalbumin, Lipid panel  Need for influenza vaccination - Plan: Flu Vaccine QUAD 6+ mos PF IM (Fluarix Quad PF)  Need for vaccination against Streptococcus pneumoniae - Plan: Pneumococcal conjugate vaccine 13-valent  Type 2 diabetes mellitus with complication, without long-term current use of insulin (HCC) - Plan: CBC with Differential/Platelet, Comprehensive metabolic panel, Lipid panel  Morbid obesity due to excess calories (HCC) - Plan: CBC with Differential/Platelet, Comprehensive metabolic panel, Lipid panel  Hypertension associated with diabetes ([place]) - Plan: CBC with Differential/Platelet, Comprehensive metabolic panel  1. Rx changes: none 2. Education: Reviewed 'ABCs' of diabetes management (respective goals in parentheses):  A1C (<7), blood pressure (<130/80), and cholesterol (LDL <100). 3. Compliance at present is estimated to be good. Efforts to improve compliance (if necessary) will be directed at increased exercise. Also discussed carbohydrates and specifically mentioning cutting back on bread, rice, pasta and potatoes. He does have an eye exam set up for February. 4. Follow up: 4 months Cursed him to set a goal waist size and work towards doing that daughter and helps reduce his need for all the present medications that he is on.

## 2017-08-01 LAB — CBC WITH DIFFERENTIAL/PLATELET
BASOS PCT: 0.5 %
Basophils Absolute: 30 cells/uL (ref 0–200)
EOS PCT: 1.2 %
Eosinophils Absolute: 71 cells/uL (ref 15–500)
HCT: 38.2 % — ABNORMAL LOW (ref 38.5–50.0)
Hemoglobin: 12.7 g/dL — ABNORMAL LOW (ref 13.2–17.1)
Lymphs Abs: 2608 cells/uL (ref 850–3900)
MCH: 25.9 pg — ABNORMAL LOW (ref 27.0–33.0)
MCHC: 33.2 g/dL (ref 32.0–36.0)
MCV: 78 fL — ABNORMAL LOW (ref 80.0–100.0)
MONOS PCT: 10.9 %
MPV: 11.9 fL (ref 7.5–12.5)
Neutro Abs: 2549 cells/uL (ref 1500–7800)
Neutrophils Relative %: 43.2 %
PLATELETS: 270 10*3/uL (ref 140–400)
RBC: 4.9 10*6/uL (ref 4.20–5.80)
RDW: 14.1 % (ref 11.0–15.0)
TOTAL LYMPHOCYTE: 44.2 %
WBC mixed population: 643 cells/uL (ref 200–950)
WBC: 5.9 10*3/uL (ref 3.8–10.8)

## 2017-08-01 LAB — LIPID PANEL
CHOLESTEROL: 141 mg/dL (ref <200)
HDL: 29 mg/dL — AB (ref >40)
LDL CHOLESTEROL (CALC): 91 mg/dL
Non-HDL Cholesterol (Calc): 112 mg/dL (calc) (ref <130)
TRIGLYCERIDES: 115 mg/dL (ref <150)
Total CHOL/HDL Ratio: 4.9 (calc) (ref <5.0)

## 2017-08-01 LAB — COMPREHENSIVE METABOLIC PANEL
AG Ratio: 1.3 (calc) (ref 1.0–2.5)
ALKALINE PHOSPHATASE (APISO): 80 U/L (ref 40–115)
ALT: 59 U/L — ABNORMAL HIGH (ref 9–46)
AST: 48 U/L — ABNORMAL HIGH (ref 10–40)
Albumin: 4.4 g/dL (ref 3.6–5.1)
BILIRUBIN TOTAL: 0.6 mg/dL (ref 0.2–1.2)
BUN/Creatinine Ratio: 10 (calc) (ref 6–22)
BUN: 14 mg/dL (ref 7–25)
CALCIUM: 9.8 mg/dL (ref 8.6–10.3)
CO2: 23 mmol/L (ref 20–32)
Chloride: 104 mmol/L (ref 98–110)
Creat: 1.39 mg/dL — ABNORMAL HIGH (ref 0.60–1.35)
Globulin: 3.3 g/dL (calc) (ref 1.9–3.7)
Glucose, Bld: 110 mg/dL — ABNORMAL HIGH (ref 65–99)
Potassium: 3.8 mmol/L (ref 3.5–5.3)
Sodium: 139 mmol/L (ref 135–146)
Total Protein: 7.7 g/dL (ref 6.1–8.1)

## 2017-08-01 LAB — TEST AUTHORIZATION

## 2017-08-01 LAB — HEP C AB W/REFL
HEPATITIS C ANTIBODY REFILL: NONREACTIVE
SIGNAL TO CUT-OFF: 0.04 (ref <1.00)

## 2017-08-01 LAB — REFLEX TIQ

## 2017-08-20 ENCOUNTER — Telehealth: Payer: Self-pay | Admitting: Family Medicine

## 2017-08-20 MED ORDER — ONETOUCH DELICA LANCETS FINE MISC: 5 refills | Status: AC

## 2017-08-20 MED ORDER — GLUCOSE BLOOD VI STRP: ORAL_STRIP | 5 refills | Status: AC

## 2017-08-20 MED ORDER — BLOOD GLUCOSE MONITORING SUPPL DEVI: 0 refills | Status: AC

## 2017-08-20 NOTE — Telephone Encounter (Signed)
[facility] one touch ultra test strips and meter for insurance coverage.

## 2017-08-20 NOTE — Telephone Encounter (Signed)
Sent in device, test strips and lancets to pharmacy

## 2017-08-25 ENCOUNTER — Other Ambulatory Visit: Payer: Self-pay | Admitting: Family Medicine

## 2017-08-25 ENCOUNTER — Telehealth: Payer: Self-pay | Admitting: Family Medicine

## 2017-08-25 DIAGNOSIS — E1169 Type 2 diabetes mellitus with other specified complication: Secondary | ICD-10-CM

## 2017-08-25 DIAGNOSIS — E782 Mixed hyperlipidemia: Principal | ICD-10-CM

## 2017-08-25 MED ORDER — ATORVASTATIN CALCIUM 20 MG PO TABS: 20.0000 mg | ORAL_TABLET | Freq: Every day | ORAL | 4 refills | Status: DC

## 2017-08-25 NOTE — Telephone Encounter (Signed)
Pt called and left message need refill of Atorvastatin.  Rev chart.  Recent visit.  Refilled.

## 2017-08-28 ENCOUNTER — Other Ambulatory Visit: Payer: Self-pay | Admitting: Family Medicine

## 2017-08-28 DIAGNOSIS — E1159 Type 2 diabetes mellitus with other circulatory complications: Secondary | ICD-10-CM

## 2017-08-28 DIAGNOSIS — I10 Essential (primary) hypertension: Principal | ICD-10-CM

## 2017-10-31 ENCOUNTER — Other Ambulatory Visit: Payer: Self-pay

## 2017-10-31 ENCOUNTER — Telehealth: Payer: Self-pay | Admitting: Family Medicine

## 2017-10-31 DIAGNOSIS — E1159 Type 2 diabetes mellitus with other circulatory complications: Secondary | ICD-10-CM

## 2017-10-31 DIAGNOSIS — I10 Essential (primary) hypertension: Principal | ICD-10-CM

## 2017-10-31 MED ORDER — AMLODIPINE BESYLATE 10 MG PO TABS: 10.0000 mg | ORAL_TABLET | Freq: Every day | ORAL | 1 refills | Status: DC

## 2017-10-31 NOTE — Telephone Encounter (Signed)
Pt called and states that he needs a refill on his Norvasc pt [id number] [place], [place] - [place] pt would like a 90 day supply, pt can be reached at [phone]

## 2017-10-31 NOTE — Telephone Encounter (Signed)
Done

## 2017-11-07 ENCOUNTER — Other Ambulatory Visit: Payer: Self-pay | Admitting: Family Medicine

## 2017-11-10 ENCOUNTER — Other Ambulatory Visit: Payer: Self-pay | Admitting: Family Medicine

## 2017-11-10 MED ORDER — METFORMIN HCL 1000 MG PO TABS: 1000.0000 mg | ORAL_TABLET | Freq: Two times a day (BID) | ORAL | 0 refills | Status: DC

## 2017-11-26 ENCOUNTER — Other Ambulatory Visit: Payer: Self-pay | Admitting: Family Medicine

## 2017-11-26 DIAGNOSIS — E1159 Type 2 diabetes mellitus with other circulatory complications: Secondary | ICD-10-CM

## 2017-11-26 DIAGNOSIS — I10 Essential (primary) hypertension: Principal | ICD-10-CM

## 2017-11-27 ENCOUNTER — Telehealth: Payer: Self-pay | Admitting: Family Medicine

## 2017-11-27 DIAGNOSIS — I152 Hypertension secondary to endocrine disorders: Secondary | ICD-10-CM

## 2017-11-27 DIAGNOSIS — E1159 Type 2 diabetes mellitus with other circulatory complications: Secondary | ICD-10-CM

## 2017-11-27 DIAGNOSIS — I10 Essential (primary) hypertension: Principal | ICD-10-CM

## 2017-11-27 NOTE — Telephone Encounter (Signed)
Work with him on getting the medication through the mail order in [place]

## 2017-11-27 NOTE — Telephone Encounter (Signed)
Rcvd a note from [facility] stating that Vincent Cantu is no longer covered by [employer] and to please send an alternate RX.

## 2017-12-01 MED ORDER — AZILSARTAN-CHLORTHALIDONE 40-25 MG PO TABS: 1.0000 | ORAL_TABLET | Freq: Every day | ORAL | 1 refills | Status: DC

## 2017-12-01 NOTE — Telephone Encounter (Signed)
Called pt's wife (she is on HIPPA)  and she is agreeable to switching to [employer] and wants 90 days cash price $120 for 90 days, gave # to call to pay for meds.  Rx sent in

## 2017-12-04 ENCOUNTER — Ambulatory Visit (INDEPENDENT_AMBULATORY_CARE_PROVIDER_SITE_OTHER): Payer: Managed Care, Other (non HMO) | Admitting: Medical

## 2017-12-04 ENCOUNTER — Encounter: Payer: Self-pay | Admitting: Medical

## 2017-12-04 ENCOUNTER — Telehealth: Payer: Self-pay | Admitting: Family Medicine

## 2017-12-04 VITALS — BP 136/84 | HR 70 | Temp 98.2°F | Wt 340.6 lb

## 2017-12-04 DIAGNOSIS — R05 Cough: Secondary | ICD-10-CM

## 2017-12-04 DIAGNOSIS — R0982 Postnasal drip: Secondary | ICD-10-CM

## 2017-12-04 DIAGNOSIS — J0110 Acute frontal sinusitis, unspecified: Secondary | ICD-10-CM | POA: Diagnosis not present

## 2017-12-04 DIAGNOSIS — R059 Cough, unspecified: Secondary | ICD-10-CM

## 2017-12-04 MED ORDER — HYDROCODONE-HOMATROPINE 5-1.5 MG/5ML PO SYRP: 5.0000 mL | ORAL_SOLUTION | Freq: Three times a day (TID) | ORAL | 0 refills | Status: DC | PRN

## 2017-12-04 MED ORDER — AMOXICILLIN 500 MG PO TABS
ORAL_TABLET | ORAL | 0 refills | Status: DC
Start: 2017-12-04 — End: 2017-12-18

## 2017-12-04 NOTE — Telephone Encounter (Signed)
Called and noitifed pt , he said that he has some information about that he will sent to [facility] for him to read over.

## 2017-12-04 NOTE — Progress Notes (Signed)
Subjective:  Vincent Cantu is a 47 y.o. male who presents for congestion, sore throat, x 1 week+.  Has lots of sinus drainage keeping him up at night.  Has some cough.   Had fever earlier in the week up to 100.  No NVD.   No SOB, no wheezing.  No ear pain.  No body aches or chills.  denies sick contacts.  Past history is significant for diabetes, HTN.   Patient is not a smoker.  Using some Mucinex DM.   No other aggravating or relieving factors.  No other c/o.  Past Medical History:  Diagnosis Date  . Diabetes mellitus without complication ([place]) 03/8098  . Hyperlipidemia   . Hypertension    Current Outpatient Medications on File Prior to Visit  Medication Sig Dispense Refill  . amLODipine (NORVASC) 10 MG tablet Take 1 tablet (10 mg total) by mouth daily. 30 tablet 1  . atorvastatin (LIPITOR) 20 MG tablet Take 1 tablet (20 mg total) by mouth daily. 30 tablet 4  . Azilsartan-Chlorthalidone (EDARBYCLOR) 40-25 MG TABS Take 1 tablet by mouth daily. 90 tablet 1  . Blood Glucose Monitoring Suppl DEVI Pt needs a one touch ultra meter 1 each 0  . glucose blood test strip Test once a day. Uses one touch ultra meter 100 each 5  . metFORMIN (GLUCOPHAGE) 1000 MG tablet Take 1 tablet (1,000 mg total) by mouth 2 (two) times daily. 180 tablet 0  . metoprolol (TOPROL XL) 200 MG 24 hr tablet Take 1 tablet (200 mg total) by mouth daily. 30 tablet 0  . Multiple Vitamins-Minerals (MULTIVITAMIN & MINERAL PO) Take 1 tablet by mouth daily.     Vincent Cantu DELICA LANCETS FINE MISC Test once a day 100 each 5   No current facility-administered medications on file prior to visit.    ROS as in subjective   Objective: BP 136/84   Pulse 70   Temp 98.2 F (36.8 C)   Wt (!) 340 lb 9.6 oz (154.5 kg)   SpO2 97%   BMI 42.57 kg/m   General appearance: Alert, WD/WN, no distress                             Skin: warm, no rash                           Head: + frontal sinus tenderness,  Eyes: conjunctiva normal, corneas clear, PERRLA                            Ears: pearly TMs, external ear canals normal                          Nose: septum midline, turbinates swollen, with erythema and mucoid discharge             Mouth/throat: somewhat dry, tongue normal, mild pharyngeal erythema                           Neck: supple, no adenopathy, no thyromegaly, non tender                          Heart: RRR, normal S1, S2, no murmurs  Lungs: CTA bilaterally, no wheezes, rales, or rhonchi       Assessment  Encounter Diagnoses  Name Primary?  . Acute non-recurrent frontal sinusitis Yes  . Cough   . Post-nasal drainage       Plan: Symptoms and exam suggestive of sinusitis, post nasal drainage and cough.  Begin round of Amoxicillin, can use hycodan syrup prn.    Discussed supportive care mesaures.    Specific home care recommendations today include:  Only take over-the-counter (OTC) or prescription medicines for pain, discomfort, or fever as directed by your caregiver.    Decongestant: You may use OTC Guaifenesin (Mucinex plain) for congestion.  You may use Pseudoephedrine (Sudafed) only if you don't have blood pressure problems or a diagnosis of hypertension.  Cough suppression: If you have cough from drainage, you may use over-the-counter Dextromethorphan (Delsym) as directed on the label  Pain/fever relief: You may use over-the-counter Tylenol for pain or fever  Drink extra fluids. Fluids help thin the mucus so your sinuses can drain more easily.   Applying either moist heat or ice packs to the sinus areas may help relieve discomfort.  Use saline nasal sprays to help moisten your sinuses. The sprays can be found at your local drugstore.    Vincent Cantu was seen today for congestion, sore throat.  Diagnoses and all orders for this visit:  Acute non-recurrent frontal sinusitis  Cough  Post-nasal drainage  Other orders -     amoxicillin (AMOXIL)  500 MG tablet; 2 tablets po BID x 10 days -     HYDROcodone-homatropine (HYCODAN) 5-1.5 MG/5ML syrup; Take 5 mLs by mouth every 8 (eight) hours as needed for cough.   Patient was advised to call or return if worse or not improving in the next few days.    Patient voiced understanding of diagnosis, recommendations, and treatment plan.

## 2017-12-04 NOTE — Telephone Encounter (Signed)
Pt asked about a supplement while checking out from his visit today. He would like to know if the supplement "Advocare" is ok for him to take. Please call pt's wife, Adonis Huguenin, at [phone] and advise.

## 2017-12-04 NOTE — Telephone Encounter (Signed)
This is a supplement and I have no data on what it is so cannot give an answer.

## 2017-12-11 LAB — HM DIABETES EYE EXAM

## 2017-12-18 ENCOUNTER — Encounter: Payer: Self-pay | Admitting: Family Medicine

## 2017-12-18 ENCOUNTER — Ambulatory Visit (INDEPENDENT_AMBULATORY_CARE_PROVIDER_SITE_OTHER): Payer: Managed Care, Other (non HMO) | Admitting: Family Medicine

## 2017-12-18 VITALS — BP 140/90 | HR 78 | Wt 339.0 lb

## 2017-12-18 DIAGNOSIS — E1169 Type 2 diabetes mellitus with other specified complication: Secondary | ICD-10-CM | POA: Diagnosis not present

## 2017-12-18 DIAGNOSIS — E118 Type 2 diabetes mellitus with unspecified complications: Secondary | ICD-10-CM

## 2017-12-18 DIAGNOSIS — E1159 Type 2 diabetes mellitus with other circulatory complications: Secondary | ICD-10-CM

## 2017-12-18 DIAGNOSIS — I152 Hypertension secondary to endocrine disorders: Secondary | ICD-10-CM

## 2017-12-18 DIAGNOSIS — J309 Allergic rhinitis, unspecified: Secondary | ICD-10-CM

## 2017-12-18 DIAGNOSIS — E782 Mixed hyperlipidemia: Secondary | ICD-10-CM | POA: Diagnosis not present

## 2017-12-18 DIAGNOSIS — I10 Essential (primary) hypertension: Secondary | ICD-10-CM

## 2017-12-18 LAB — POCT GLYCOSYLATED HEMOGLOBIN (HGB A1C): Hemoglobin A1C: 6.8

## 2017-12-18 NOTE — Progress Notes (Signed)
  Subjective:    Patient ID: Vincent Cantu, male    DOB: 05-Jul-1971, 47 y.o.   MRN: [id number]  Vincent Cantu is a 47 y.o. male who presents for follow-up of Type 2 diabetes mellitus.  Patient is checking home blood sugars.   Home blood sugar records: BGs range between 90 and 110 How often is blood sugars being checked: once a day Current symptoms/problems include none and have been improving. Daily foot checks: yes   Any foot concerns: none Last eye exam: last week with Dr. Simonne Come Exercise: walking daily for at least 30 minutes. He and his wife is also started a dietary program is no having a liquid supplement in the morning.  He continues on his metformin.  He is also taking his blood pressure medications and is having no difficulty with that.  He also continues on Lipitor.  His allergies seem to be under good control.   The following portions of the patient's history were reviewed and updated as appropriate: allergies, current medications, past medical history, past social history and problem list.  ROS as in subjective above.     Objective:    Physical Exam Alert and in no distress otherwise not examined.  Blood pressure 140/90, pulse 78, weight (!) 339 lb (153.8 kg).  Lab Review Diabetic Labs Latest Ref Rng & Units 12/18/2017 07/30/2017 03/12/2017 11/13/2016 07/10/2016  HbA1c - 6.8% 6.8 6.7 6.1 6.2  Microalbumin mg/L - 39.7 - - 30.4  Micro/Creat Ratio - - 12.3 - - 15.2  Chol <200 mg/dL - 141 - - 128  HDL >40 mg/dL - 29(L) - - 25(L)  Calc LDL <130 mg/dL - - - - 72  Triglycerides <150 mg/dL - 115 - - 153(H)  Creatinine 0.60 - 1.35 mg/dL - 1.39(H) - - 1.51(H)  GFR >60.00 mL/min - - - - -   BP/Weight 12/18/2017 12/04/2017 07/30/2017 03/12/2017 27/51/7001  Systolic BP 749 449 675 916 384  Diastolic BP 90 84 80 88 88  Wt. (Lbs) 339 340.6 338.6 336 325.4  BMI 42.37 42.57 42.32 42 40.67   Foot/eye exam completion dates Latest Ref Rng & Units 12/11/2017 11/26/2016  Eye Exam No  Retinopathy No Retinopathy No Retinopathy  Foot Form Completion - - -  A1c is 6.8  Vincent Cantu  reports that  has never smoked. he has never used smokeless tobacco. He reports that he does not drink alcohol or use drugs.     Assessment & Plan:    Type 2 diabetes mellitus with complication, without long-term current use of insulin (HCC) - Plan: HgB A1c  Allergic rhinitis, unspecified seasonality, unspecified trigger  Hypertension associated with diabetes ([place])  Mixed hyperlipidemia due to type 2 diabetes mellitus ([place])  Morbid obesity ([place])   1. Rx changes: none 2. Education: Reviewed 'ABCs' of diabetes management (respective goals in parentheses):  A1C (<7), blood pressure (<130/80), and cholesterol (LDL <100). 3. Compliance at present is estimated to be good. Efforts to improve compliance (if necessary) will be directed at Continue with his present diet and exercise regimen. 4. Follow up: 4 months  5. Congratulated him on his lifestyle change and recommend he set a goal waist size rather than weight.

## 2017-12-19 ENCOUNTER — Other Ambulatory Visit: Payer: Self-pay

## 2017-12-19 DIAGNOSIS — E1159 Type 2 diabetes mellitus with other circulatory complications: Secondary | ICD-10-CM

## 2017-12-19 DIAGNOSIS — I10 Essential (primary) hypertension: Principal | ICD-10-CM

## 2017-12-19 DIAGNOSIS — I152 Hypertension secondary to endocrine disorders: Secondary | ICD-10-CM

## 2017-12-19 MED ORDER — AMLODIPINE BESYLATE 10 MG PO TABS: 10.0000 mg | ORAL_TABLET | Freq: Every day | ORAL | 1 refills | Status: DC

## 2017-12-24 ENCOUNTER — Other Ambulatory Visit: Payer: Self-pay | Admitting: Family Medicine

## 2017-12-24 DIAGNOSIS — E782 Mixed hyperlipidemia: Principal | ICD-10-CM

## 2017-12-24 DIAGNOSIS — E1169 Type 2 diabetes mellitus with other specified complication: Secondary | ICD-10-CM

## 2018-02-12 ENCOUNTER — Other Ambulatory Visit: Payer: Self-pay | Admitting: Family Medicine

## 2018-02-12 DIAGNOSIS — E1159 Type 2 diabetes mellitus with other circulatory complications: Secondary | ICD-10-CM

## 2018-02-12 DIAGNOSIS — I10 Essential (primary) hypertension: Principal | ICD-10-CM

## 2018-02-27 ENCOUNTER — Other Ambulatory Visit: Payer: Self-pay | Admitting: Family Medicine

## 2018-02-27 DIAGNOSIS — I10 Essential (primary) hypertension: Principal | ICD-10-CM

## 2018-02-27 DIAGNOSIS — E1159 Type 2 diabetes mellitus with other circulatory complications: Secondary | ICD-10-CM

## 2018-02-27 DIAGNOSIS — I152 Hypertension secondary to endocrine disorders: Secondary | ICD-10-CM

## 2018-03-17 ENCOUNTER — Other Ambulatory Visit: Payer: Self-pay | Admitting: Family Medicine

## 2018-03-17 DIAGNOSIS — E1169 Type 2 diabetes mellitus with other specified complication: Secondary | ICD-10-CM

## 2018-03-17 DIAGNOSIS — E782 Mixed hyperlipidemia: Principal | ICD-10-CM

## 2018-03-24 ENCOUNTER — Encounter: Payer: Self-pay | Admitting: Family Medicine

## 2018-03-24 ENCOUNTER — Ambulatory Visit
Admission: RE | Admit: 2018-03-24 | Discharge: 2018-03-24 | Disposition: A | Discharge status: Home / Self Care | Payer: Managed Care, Other (non HMO) | Source: Ambulatory Visit | Attending: Family Medicine | Admitting: Family Medicine

## 2018-03-24 ENCOUNTER — Ambulatory Visit (INDEPENDENT_AMBULATORY_CARE_PROVIDER_SITE_OTHER): Payer: Managed Care, Other (non HMO) | Admitting: Family Medicine

## 2018-03-24 VITALS — BP 148/102 | HR 76 | Temp 98.0°F | Ht 75.0 in | Wt 338.4 lb

## 2018-03-24 DIAGNOSIS — M069 Rheumatoid arthritis, unspecified: Secondary | ICD-10-CM

## 2018-03-24 DIAGNOSIS — M25461 Effusion, right knee: Secondary | ICD-10-CM

## 2018-03-24 DIAGNOSIS — M1990 Unspecified osteoarthritis, unspecified site: Secondary | ICD-10-CM | POA: Diagnosis not present

## 2018-03-24 HISTORY — DX: Unspecified osteoarthritis, unspecified site: M19.90

## 2018-03-24 MED ORDER — LIDOCAINE HCL 2 % IJ SOLN
10.0000 mL | Freq: Once | INTRAMUSCULAR | Status: AC
  Administered 2018-03-24: 200 mg via INTRADERMAL

## 2018-03-24 MED ORDER — TRIAMCINOLONE ACETONIDE 40 MG/ML IJ SUSP
40.0000 mg | Freq: Once | INTRAMUSCULAR | Status: AC
  Administered 2018-03-24: 40 mg via INTRAMUSCULAR

## 2018-03-24 NOTE — Progress Notes (Signed)
   Subjective:    Patient ID: Vincent Cantu, male    DOB: 04-26-1971, 47 y.o.   MRN: [id number]  HPI He complains of a 10-day history of right knee pain and popping sensation.  He has no history of overuse or previous injury.  He does remember seeing an orthopedic surgeon roughly 8 years ago who told him he had arthritis.  He has been fairly good until the last 10 days.  No other joints are involved.   Review of Systems     Objective:   Physical Exam Alert and in no distress.  Small effusion is noted in the right knee.  Negative anterior drawer.  Medial lateral collateral ligaments intact.  McMurray's testing negative.       Assessment & Plan:  Effusion of right knee - Plan: DG Knee Complete 4 Views Right, triamcinolone acetonide (KENALOG-40) injection 40 mg, lidocaine (XYLOCAINE) 2 % (with pres) injection 200 mg  Arthritis  Based on his history I will get another x-ray to see the extent of his arthritis.  I explained the fact that he is now having difficulty with this and discussed options.  He elected to have an injection.  Explained that if this lasts a long time we are in good shape but if it does not last long enough he will need further evaluation and treatment with some of the newer injection medicines.  Also discussed possible need for knee replacement.

## 2018-04-15 ENCOUNTER — Other Ambulatory Visit: Payer: Self-pay | Admitting: Family Medicine

## 2018-04-15 ENCOUNTER — Ambulatory Visit (INDEPENDENT_AMBULATORY_CARE_PROVIDER_SITE_OTHER): Payer: Managed Care, Other (non HMO) | Admitting: Family Medicine

## 2018-04-15 ENCOUNTER — Encounter: Payer: Self-pay | Admitting: Family Medicine

## 2018-04-15 VITALS — BP 140/86 | HR 87 | Temp 98.1°F | Ht 75.0 in | Wt 336.4 lb

## 2018-04-15 DIAGNOSIS — E785 Hyperlipidemia, unspecified: Secondary | ICD-10-CM | POA: Diagnosis not present

## 2018-04-15 DIAGNOSIS — M1990 Unspecified osteoarthritis, unspecified site: Secondary | ICD-10-CM | POA: Diagnosis not present

## 2018-04-15 DIAGNOSIS — E118 Type 2 diabetes mellitus with unspecified complications: Secondary | ICD-10-CM | POA: Diagnosis not present

## 2018-04-15 DIAGNOSIS — E1159 Type 2 diabetes mellitus with other circulatory complications: Secondary | ICD-10-CM | POA: Diagnosis not present

## 2018-04-15 DIAGNOSIS — I152 Hypertension secondary to endocrine disorders: Secondary | ICD-10-CM

## 2018-04-15 DIAGNOSIS — E782 Mixed hyperlipidemia: Principal | ICD-10-CM

## 2018-04-15 DIAGNOSIS — E1169 Type 2 diabetes mellitus with other specified complication: Secondary | ICD-10-CM | POA: Diagnosis not present

## 2018-04-15 DIAGNOSIS — I10 Essential (primary) hypertension: Secondary | ICD-10-CM | POA: Diagnosis not present

## 2018-04-15 LAB — POCT GLYCOSYLATED HEMOGLOBIN (HGB A1C): Hemoglobin A1C: 6.8 % — AB (ref 4.0–5.6)

## 2018-04-15 MED ORDER — NEBIVOLOL HCL 5 MG PO TABS: 5.0000 mg | ORAL_TABLET | Freq: Every day | ORAL | 3 refills | Status: DC

## 2018-04-15 MED ORDER — TERAZOSIN HCL 1 MG PO CAPS: 1.0000 mg | ORAL_CAPSULE | Freq: Every day | ORAL | 3 refills | Status: DC

## 2018-04-15 NOTE — Addendum Note (Signed)
Addended by: Elyse Jarvis on: 04/15/2018 09:55 AM   Modules accepted: Orders

## 2018-04-15 NOTE — Progress Notes (Signed)
  Subjective:    Patient ID: Vincent Cantu, male    DOB: 03/06/1971, 47 y.o.   MRN: [id number]  Vincent Cantu is a 47 y.o. male who presents for follow-up of Type 2 diabetes mellitus.  Patient is checking home blood sugars.   Home blood sugar records: meter records How often is blood sugars being checked: 1-2-a week Current symptoms/problems include none and have been unchanged. Daily foot checks: yes  Any foot concerns: no Last eye exam: jan 2019 Exercise: walking on lunch breaks for 15 minutes. He continues on his blood pressure medications and is having no difficulty with that.  He continues on Metformin and atorvastatin.  He continues have difficulty with right knee pain.  He did get an injection but states that it has not taken away his symptoms completely.  The following portions of the patient's history were reviewed and updated as appropriate: allergies, current medications, past medical history, past social history and problem list.  ROS as in subjective above.     Objective:    Physical Exam Alert and in no distress otherwise not examined.   Lab Review Diabetic Labs Latest Ref Rng & Units 12/18/2017 07/30/2017 03/12/2017 11/13/2016 07/10/2016  HbA1c - 6.8% 6.8 6.7 6.1 6.2  Microalbumin mg/L - 39.7 - - 30.4  Micro/Creat Ratio - - 12.3 - - 15.2  Chol <200 mg/dL - 141 - - 128  HDL >40 mg/dL - 29(L) - - 25(L)  Calc LDL mg/dL (calc) - 91 - - 72  Triglycerides <150 mg/dL - 115 - - 153(H)  Creatinine 0.60 - 1.35 mg/dL - 1.39(H) - - 1.51(H)  GFR >60.00 mL/min - - - - -   BP/Weight 04/15/2018 03/24/2018 12/18/2017 12/04/2017 6/75/9163  Systolic BP 846 659 935 701 779  Diastolic BP 86 390 90 84 80  Wt. (Lbs) 336.4 338.4 339 340.6 338.6  BMI 42.05 42.3 42.37 42.57 42.32   Foot/eye exam completion dates Latest Ref Rng & Units 12/11/2017 11/26/2016  Eye Exam No Retinopathy No Retinopathy No Retinopathy  Foot Form Completion - - -   Hemoglobin A1c is 6.8 [name]  reports that he has  never smoked. He has never used smokeless tobacco. He reports that he does not drink alcohol or use drugs.     Assessment & Plan:    Type 2 diabetes mellitus with complication, without long-term current use of insulin (HCC)  Arthritis  Hypertension associated with diabetes ([place]) - Plan: nebivolol (BYSTOLIC) 5 MG tablet, terazosin (HYTRIN) 1 MG capsule  Morbid obesity ([place])  Hyperlipidemia associated with type 2 diabetes mellitus ([place])    1. Rx changes: Hytrin added to his regimen 2. Education: Reviewed 'ABCs' of diabetes management (respective goals in parentheses):  A1C (<7), blood pressure (<130/80), and cholesterol (LDL <100). 3. Compliance at present is estimated to be fair. Efforts to improve compliance (if necessary) will be directed at increased exercise. 4. Follow up: 4 months I explained that he has maxed out on his present blood pressure medications.  I will add Hytrin to his regimen.  Also discussed the pain he is having in his knee and the fact that continued exercise with weight reduction will be a major benefit for his knee as well as overall health.

## 2018-05-02 ENCOUNTER — Other Ambulatory Visit: Payer: Self-pay | Admitting: Family Medicine

## 2018-05-15 ENCOUNTER — Other Ambulatory Visit: Payer: Self-pay | Admitting: Family Medicine

## 2018-05-15 DIAGNOSIS — E1169 Type 2 diabetes mellitus with other specified complication: Secondary | ICD-10-CM

## 2018-05-15 DIAGNOSIS — E782 Mixed hyperlipidemia: Principal | ICD-10-CM

## 2018-06-14 ENCOUNTER — Other Ambulatory Visit: Payer: Self-pay | Admitting: Family Medicine

## 2018-06-14 DIAGNOSIS — I10 Essential (primary) hypertension: Principal | ICD-10-CM

## 2018-06-14 DIAGNOSIS — E1159 Type 2 diabetes mellitus with other circulatory complications: Secondary | ICD-10-CM

## 2018-06-15 ENCOUNTER — Other Ambulatory Visit: Payer: Self-pay | Admitting: Family Medicine

## 2018-06-15 DIAGNOSIS — E1169 Type 2 diabetes mellitus with other specified complication: Secondary | ICD-10-CM

## 2018-06-15 DIAGNOSIS — E782 Mixed hyperlipidemia: Principal | ICD-10-CM

## 2018-07-07 ENCOUNTER — Other Ambulatory Visit: Payer: Self-pay | Admitting: Family Medicine

## 2018-07-07 DIAGNOSIS — E1169 Type 2 diabetes mellitus with other specified complication: Secondary | ICD-10-CM

## 2018-07-07 DIAGNOSIS — E782 Mixed hyperlipidemia: Principal | ICD-10-CM

## 2018-07-30 ENCOUNTER — Other Ambulatory Visit: Payer: Self-pay | Admitting: Family Medicine

## 2018-08-14 ENCOUNTER — Other Ambulatory Visit: Payer: Self-pay | Admitting: Family Medicine

## 2018-08-14 DIAGNOSIS — E1169 Type 2 diabetes mellitus with other specified complication: Secondary | ICD-10-CM

## 2018-08-14 DIAGNOSIS — E782 Mixed hyperlipidemia: Principal | ICD-10-CM

## 2018-08-14 NOTE — Telephone Encounter (Signed)
Pt has an appt.

## 2018-08-25 ENCOUNTER — Ambulatory Visit: Payer: Managed Care, Other (non HMO) | Admitting: Family Medicine

## 2018-09-09 ENCOUNTER — Other Ambulatory Visit: Payer: Self-pay | Admitting: Family Medicine

## 2018-09-09 DIAGNOSIS — E1169 Type 2 diabetes mellitus with other specified complication: Secondary | ICD-10-CM

## 2018-09-09 DIAGNOSIS — E782 Mixed hyperlipidemia: Principal | ICD-10-CM

## 2018-09-10 ENCOUNTER — Other Ambulatory Visit: Payer: Self-pay | Admitting: Family Medicine

## 2018-09-10 DIAGNOSIS — I152 Hypertension secondary to endocrine disorders: Secondary | ICD-10-CM

## 2018-09-10 DIAGNOSIS — I10 Essential (primary) hypertension: Principal | ICD-10-CM

## 2018-09-10 DIAGNOSIS — E1159 Type 2 diabetes mellitus with other circulatory complications: Secondary | ICD-10-CM

## 2018-09-22 ENCOUNTER — Ambulatory Visit (INDEPENDENT_AMBULATORY_CARE_PROVIDER_SITE_OTHER): Payer: Managed Care, Other (non HMO) | Admitting: Family Medicine

## 2018-09-22 VITALS — BP 152/98 | HR 77 | Temp 98.0°F | Ht 75.0 in | Wt 337.2 lb

## 2018-09-22 DIAGNOSIS — E118 Type 2 diabetes mellitus with unspecified complications: Secondary | ICD-10-CM | POA: Diagnosis not present

## 2018-09-22 DIAGNOSIS — Z23 Encounter for immunization: Secondary | ICD-10-CM

## 2018-09-22 DIAGNOSIS — I152 Hypertension secondary to endocrine disorders: Secondary | ICD-10-CM

## 2018-09-22 DIAGNOSIS — E1159 Type 2 diabetes mellitus with other circulatory complications: Secondary | ICD-10-CM | POA: Diagnosis not present

## 2018-09-22 DIAGNOSIS — E785 Hyperlipidemia, unspecified: Secondary | ICD-10-CM

## 2018-09-22 DIAGNOSIS — I10 Essential (primary) hypertension: Secondary | ICD-10-CM

## 2018-09-22 DIAGNOSIS — E1169 Type 2 diabetes mellitus with other specified complication: Secondary | ICD-10-CM | POA: Diagnosis not present

## 2018-09-22 LAB — POCT GLYCOSYLATED HEMOGLOBIN (HGB A1C): Hemoglobin A1C: 7.4 % — AB (ref 4.0–5.6)

## 2018-09-22 LAB — POCT UA - MICROALBUMIN
Albumin/Creatinine Ratio, Urine, POC: 14.7
CREATININE, POC: 173.8 mg/dL
MICROALBUMIN (UR) POC: 25.5 mg/L

## 2018-09-22 MED ORDER — NEBIVOLOL HCL 10 MG PO TABS: 10.0000 mg | ORAL_TABLET | Freq: Every day | ORAL | 3 refills | Status: DC

## 2018-09-22 NOTE — Progress Notes (Signed)
  Subjective:    Patient ID: Vincent Cantu, male    DOB: September 24, 1971, 47 y.o.   MRN: [id number]  Vincent Cantu is a 47 y.o. male who presents for follow-up of Type 2 diabetes mellitus.  Patient is checking home blood sugars.   Home blood sugar records: meter record How often is blood sugars being checked: Q three days  110-121 avg. Current symptoms/problems include none and have been unchanged. Daily foot checks: yes   Any foot concerns: none Last eye exam: 11-2017  Exercise: walking QD  The following portions of the patient's history were reviewed and updated as appropriate: allergies, current medications, past medical history, past social history and problem list.  ROS as in subjective above.     Objective:    Physical Exam Alert and in no distress otherwise not examined.  Blood pressure (!) 152/98, pulse 77, temperature 98 F (36.7 C), height 6\' 3"  (1.905 m), weight (!) 337 lb 3.2 oz (153 kg), SpO2 99 %.  Lab Review Diabetic Labs Latest Ref Rng & Units 09/22/2018 04/15/2018 12/18/2017 07/30/2017 03/12/2017  HbA1c 4.0 - 5.6 % 7.4(A) 6.8(A) 6.8% 6.8 6.7  Microalbumin mg/L - - - 39.7 -  Micro/Creat Ratio - - - - 12.3 -  Chol <200 mg/dL - - - 141 -  HDL >40 mg/dL - - - 29(L) -  Calc LDL mg/dL (calc) - - - 91 -  Triglycerides <150 mg/dL - - - 115 -  Creatinine 0.60 - 1.35 mg/dL - - - 1.39(H) -  GFR >60.00 mL/min - - - - -   BP/Weight 09/22/2018 04/15/2018 03/24/2018 12/18/2017 12/18/8655  Systolic BP 846 962 952 841 324  Diastolic BP 98 86 401 90 84  Wt. (Lbs) 337.2 336.4 338.4 339 340.6  BMI 42.15 42.05 42.3 42.37 42.57   Foot/eye exam completion dates Latest Ref Rng & Units 12/11/2017 11/26/2016  Eye Exam No Retinopathy No Retinopathy No Retinopathy  Foot Form Completion - - -  A1c is 7.4  Durell  reports that he has never smoked. He has never used smokeless tobacco. He reports that he does not drink alcohol or use drugs.     Assessment & Plan:    Type 2 diabetes mellitus  with complication, without long-term current use of insulin (HCC) - Plan: CBC with Differential/Platelet, Comprehensive metabolic panel, Lipid panel, POCT UA - Microalbumin, POCT glycosylated hemoglobin (Hb A1C)  Hypertension associated with diabetes ([place]) - Plan: CBC with Differential/Platelet, Comprehensive metabolic panel, nebivolol (BYSTOLIC) 10 MG tablet  Morbid obesity (HCC) - Plan: CBC with Differential/Platelet, Comprehensive metabolic panel, Lipid panel  Hyperlipidemia associated with type 2 diabetes mellitus (HCC) - Plan: Lipid panel  Need for influenza vaccination - Plan: Flu Vaccine QUAD 6+ mos PF IM (Fluarix Quad PF)   1. Rx changes: Increase Bystolic to 10 mg/day.  Also encouraged him to always bring all his medications and as he was unsure that he was even on this medication. 2. Education: Reviewed 'ABCs' of diabetes management (respective goals in parentheses):  A1C (<7), blood pressure (<130/80), and cholesterol (LDL <100). 3. Compliance at present is estimated to be adequate. Efforts to improve compliance (if necessary) will be directed at Dietary modification.  Also discussed a research study or possibly going to [facility] at [facility].. 4. Follow up: 4 months Information given to him concerning [employer]

## 2018-09-23 LAB — COMPREHENSIVE METABOLIC PANEL
ALK PHOS: 95 IU/L (ref 39–117)
ALT: 50 IU/L — AB (ref 0–44)
AST: 63 IU/L — AB (ref 0–40)
Albumin/Globulin Ratio: 1.3 (ref 1.2–2.2)
Albumin: 4.4 g/dL (ref 3.5–5.5)
BUN/Creatinine Ratio: 9 (ref 9–20)
BUN: 11 mg/dL (ref 6–24)
Bilirubin Total: 0.5 mg/dL (ref 0.0–1.2)
CALCIUM: 9.8 mg/dL (ref 8.7–10.2)
CO2: 21 mmol/L (ref 20–29)
CREATININE: 1.28 mg/dL — AB (ref 0.76–1.27)
Chloride: 101 mmol/L (ref 96–106)
GFR calc Af Amer: 75 mL/min/{1.73_m2} (ref >59)
GFR calc non Af Amer: 65 mL/min/{1.73_m2} (ref >59)
GLOBULIN, TOTAL: 3.5 g/dL (ref 1.5–4.5)
Glucose: 121 mg/dL — ABNORMAL HIGH (ref 65–99)
POTASSIUM: 4 mmol/L (ref 3.5–5.2)
SODIUM: 141 mmol/L (ref 134–144)
Total Protein: 7.9 g/dL (ref 6.0–8.5)

## 2018-09-23 LAB — CBC WITH DIFFERENTIAL/PLATELET
BASOS ABS: 0 10*3/uL (ref 0.0–0.2)
Basos: 1 %
EOS (ABSOLUTE): 0.1 10*3/uL (ref 0.0–0.4)
Eos: 1 %
HEMOGLOBIN: 12.8 g/dL — AB (ref 13.0–17.7)
Hematocrit: 39 % (ref 37.5–51.0)
IMMATURE GRANS (ABS): 0 10*3/uL (ref 0.0–0.1)
IMMATURE GRANULOCYTES: 1 %
LYMPHS: 43 %
Lymphocytes Absolute: 2.9 10*3/uL (ref 0.7–3.1)
MCH: 25.5 pg — AB (ref 26.6–33.0)
MCHC: 32.8 g/dL (ref 31.5–35.7)
MCV: 78 fL — ABNORMAL LOW (ref 79–97)
MONOCYTES: 9 %
Monocytes Absolute: 0.5 10*3/uL (ref 0.1–0.9)
NEUTROS ABS: 2.9 10*3/uL (ref 1.4–7.0)
Neutrophils: 45 %
Platelets: 284 10*3/uL (ref 150–450)
RBC: 5.02 x10E6/uL (ref 4.14–5.80)
RDW: 14.3 % (ref 12.3–15.4)
WBC: 6.4 10*3/uL (ref 3.4–10.8)

## 2018-09-23 LAB — LIPID PANEL
CHOLESTEROL TOTAL: 138 mg/dL (ref 100–199)
Chol/HDL Ratio: 4.8 ratio (ref 0.0–5.0)
HDL: 29 mg/dL — AB (ref >39)
LDL CALC: 85 mg/dL (ref 0–99)
TRIGLYCERIDES: 120 mg/dL (ref 0–149)
VLDL Cholesterol Cal: 24 mg/dL (ref 5–40)

## 2018-09-25 ENCOUNTER — Telehealth: Payer: Self-pay

## 2018-09-25 MED ORDER — METOPROLOL SUCCINATE ER 100 MG PO TB24: 100.0000 mg | ORAL_TABLET | Freq: Every day | ORAL | 3 refills | Status: DC

## 2018-09-25 NOTE — Telephone Encounter (Signed)
I called the medication into [facility].  Have him come in for recheck in 1 or 2 months

## 2018-09-25 NOTE — Telephone Encounter (Signed)
Patient went to pick up bystolic and it's too expensive. Patient wants to know if something else less expensive can be sent to the pharmacy for him.

## 2018-09-28 NOTE — Telephone Encounter (Signed)
Pt made aware and appt made. [place]

## 2018-10-01 ENCOUNTER — Other Ambulatory Visit: Payer: Self-pay | Admitting: Family Medicine

## 2018-10-01 DIAGNOSIS — E782 Mixed hyperlipidemia: Principal | ICD-10-CM

## 2018-10-01 DIAGNOSIS — E1169 Type 2 diabetes mellitus with other specified complication: Secondary | ICD-10-CM

## 2018-10-25 ENCOUNTER — Other Ambulatory Visit: Payer: Self-pay | Admitting: Family Medicine

## 2018-10-29 ENCOUNTER — Other Ambulatory Visit: Payer: Self-pay | Admitting: Family Medicine

## 2018-10-29 DIAGNOSIS — E782 Mixed hyperlipidemia: Principal | ICD-10-CM

## 2018-10-29 DIAGNOSIS — E1169 Type 2 diabetes mellitus with other specified complication: Secondary | ICD-10-CM

## 2018-11-24 ENCOUNTER — Ambulatory Visit (INDEPENDENT_AMBULATORY_CARE_PROVIDER_SITE_OTHER): Payer: Managed Care, Other (non HMO) | Admitting: Family Medicine

## 2018-11-24 VITALS — BP 140/82 | HR 67 | Wt 341.0 lb

## 2018-11-24 DIAGNOSIS — E118 Type 2 diabetes mellitus with unspecified complications: Secondary | ICD-10-CM

## 2018-11-24 DIAGNOSIS — I10 Essential (primary) hypertension: Principal | ICD-10-CM

## 2018-11-24 DIAGNOSIS — E1159 Type 2 diabetes mellitus with other circulatory complications: Secondary | ICD-10-CM

## 2018-11-24 MED ORDER — TERAZOSIN HCL 2 MG PO CAPS: 2.0000 mg | ORAL_CAPSULE | Freq: Every day | ORAL | 1 refills | Status: DC

## 2018-11-24 NOTE — Progress Notes (Signed)
   Subjective:    Patient ID: Vincent Cantu, male    DOB: Nov 26, 1970, 48 y.o.   MRN: [id number]  HPI He is here for recheck on his blood pressure.  He continues on his present medication regimen that is now including Terrazas in.  He has had no difficulty with this medication.   Review of Systems     Objective:   Physical Exam  Alert and in no distress.  Blood pressure is recorded.      Assessment & Plan:  Hypertension associated with diabetes ([place]) - Plan: terazosin (HYTRIN) 2 MG capsule  Type 2 diabetes mellitus with complication, without long-term current use of insulin ([place]) He is to double his present Hytrin until is gone and then switch to the 2 mg. He is to follow-up with me with his next diabetes recheck.  Also discussed possibly placing him on a GLP-1 to help with his weight

## 2018-11-25 ENCOUNTER — Other Ambulatory Visit: Payer: Self-pay | Admitting: Family Medicine

## 2018-11-25 DIAGNOSIS — E1169 Type 2 diabetes mellitus with other specified complication: Secondary | ICD-10-CM

## 2018-11-25 DIAGNOSIS — E782 Mixed hyperlipidemia: Principal | ICD-10-CM

## 2018-11-29 ENCOUNTER — Other Ambulatory Visit: Payer: Self-pay | Admitting: Family Medicine

## 2018-11-29 DIAGNOSIS — E1159 Type 2 diabetes mellitus with other circulatory complications: Secondary | ICD-10-CM

## 2018-11-29 DIAGNOSIS — I152 Hypertension secondary to endocrine disorders: Secondary | ICD-10-CM

## 2018-11-29 DIAGNOSIS — I10 Essential (primary) hypertension: Principal | ICD-10-CM

## 2018-12-15 LAB — HM DIABETES EYE EXAM

## 2018-12-17 ENCOUNTER — Other Ambulatory Visit: Payer: Self-pay | Admitting: Family Medicine

## 2018-12-17 DIAGNOSIS — E1169 Type 2 diabetes mellitus with other specified complication: Secondary | ICD-10-CM

## 2018-12-17 DIAGNOSIS — E782 Mixed hyperlipidemia: Principal | ICD-10-CM

## 2019-01-21 ENCOUNTER — Encounter: Payer: Self-pay | Admitting: Family Medicine

## 2019-01-21 ENCOUNTER — Ambulatory Visit (INDEPENDENT_AMBULATORY_CARE_PROVIDER_SITE_OTHER): Payer: Managed Care, Other (non HMO) | Admitting: Family Medicine

## 2019-01-21 VITALS — BP 146/98 | HR 87 | Temp 98.1°F | Wt 343.6 lb

## 2019-01-21 DIAGNOSIS — I10 Essential (primary) hypertension: Secondary | ICD-10-CM

## 2019-01-21 DIAGNOSIS — E1169 Type 2 diabetes mellitus with other specified complication: Secondary | ICD-10-CM | POA: Diagnosis not present

## 2019-01-21 DIAGNOSIS — E1159 Type 2 diabetes mellitus with other circulatory complications: Secondary | ICD-10-CM | POA: Diagnosis not present

## 2019-01-21 DIAGNOSIS — E118 Type 2 diabetes mellitus with unspecified complications: Secondary | ICD-10-CM

## 2019-01-21 DIAGNOSIS — E785 Hyperlipidemia, unspecified: Secondary | ICD-10-CM

## 2019-01-21 DIAGNOSIS — D126 Benign neoplasm of colon, unspecified: Secondary | ICD-10-CM

## 2019-01-21 HISTORY — DX: Benign neoplasm of colon, unspecified: D12.6

## 2019-01-21 LAB — POCT GLYCOSYLATED HEMOGLOBIN (HGB A1C): Hemoglobin A1C: 7.2 % — AB (ref 4.0–5.6)

## 2019-01-21 MED ORDER — EXENATIDE ER 2 MG/0.85ML ~~LOC~~ AUIJ: 1.0000 "application " | AUTO-INJECTOR | SUBCUTANEOUS | 1 refills | Status: DC

## 2019-01-21 NOTE — Addendum Note (Signed)
Addended by: Elyse Jarvis on: 01/21/2019 12:32 PM   Modules accepted: Orders

## 2019-01-21 NOTE — Progress Notes (Signed)
Subjective:    Patient ID: Vincent Cantu, male    DOB: 1971-07-05, 48 y.o.   MRN: [id number]  Vincent Cantu is a 48 y.o. male who presents for follow-up of Type 2 diabetes mellitus.  Patient is checking home blood sugars.   Home blood sugar records: meter records How often is blood sugars being checked: QOD fasting 117-135 Current symptoms/problems include none at this time. Daily foot checks: yes   Any foot concerns: none at this time Last eye exam: 11-2018 Exercise: walking  And gym three time a week.  He has stated that he has made some dietary changes.  He has not seen any weight change.  He continues on multiple medications for his blood pressure.  He is also taking metformin and having no difficulty with that. Does have a history of colonic polyps and his last colonoscopy was in 2014 The following portions of the patient's history were reviewed and updated as appropriate: allergies, current medications, past medical history, past social history and problem list.  ROS as in subjective above.     Objective:    Physical Exam Alert and in no distress otherwise not examined.   Lab Review Diabetic Labs Latest Ref Rng & Units 09/22/2018 04/15/2018 12/18/2017 07/30/2017 03/12/2017  HbA1c 4.0 - 5.6 % 7.4(A) 6.8(A) 6.8% 6.8 6.7  Microalbumin mg/L 25.5 - - 39.7 -  Micro/Creat Ratio - 14.7 - - 12.3 -  Chol 100 - 199 mg/dL 138 - - 141 -  HDL >39 mg/dL 29(L) - - 29(L) -  Calc LDL 0 - 99 mg/dL 85 - - 91 -  Triglycerides 0 - 149 mg/dL 120 - - 115 -  Creatinine 0.76 - 1.27 mg/dL 1.28(H) - - 1.39(H) -  GFR >60.00 mL/min - - - - -   BP/Weight 11/24/2018 09/22/2018 04/15/2018 03/24/2018 06/12/3663  Systolic BP 403 474 259 563 875  Diastolic BP 82 98 86 643 90  Wt. (Lbs) 341 337.2 336.4 338.4 339  BMI 42.62 42.15 42.05 42.3 42.37   Foot/eye exam completion dates Latest Ref Rng & Units 12/15/2018 12/11/2017  Eye Exam No Retinopathy No Retinopathy No Retinopathy  Foot Form Completion - - -  A1c is  7.2  Vincent Cantu  reports that he has never smoked. He has never used smokeless tobacco. He reports that he does not drink alcohol or use drugs.     Assessment & Plan:    Type 2 diabetes mellitus with complication, without long-term current use of insulin (HCC) - Plan: Exenatide ER (BYDUREON BCISE) 2 MG/0.85ML AUIJ  Hypertension associated with diabetes ([place])  Morbid obesity ([place])  Hyperlipidemia associated with type 2 diabetes mellitus ([place])  Adenomatous polyp of colon, unspecified part of colon   1. Rx changes: Bydureon BCize 2. Education: Reviewed 'ABCs' of diabetes management (respective goals in parentheses):  A1C (<7), blood pressure (<130/80), and cholesterol (LDL <100). 3. Compliance at present is estimated to be fair. Efforts to improve compliance (if necessary) will be directed at I again discussed diet and exercise with him.. 4. Follow up: 4 months Although his hemoglobin A1c is in a good range, his weight is unchanged.  I discussed options with him including weight loss medications, diabetes medications, referral to the weight loss and wellness center as well as surgical intervention. We have opted to go with a GLP-1 to see if that will help with his weight.  He was comfortable with that. We will also check with GI concerning when his next colonoscopy is.

## 2019-01-21 NOTE — Patient Instructions (Signed)
Keep going with the exercise and taking all your other medications.  Once you get the Bydureon, come here and have can help you with administering it.  If there are any questions about the cost of medicine again let us know. 20 minutes of something physical daily or 150 minutes a week of something physical

## 2019-01-22 ENCOUNTER — Other Ambulatory Visit: Payer: Self-pay | Admitting: Family Medicine

## 2019-01-22 ENCOUNTER — Telehealth: Payer: Self-pay | Admitting: Family Medicine

## 2019-01-22 NOTE — Telephone Encounter (Signed)
Recv'd fax from pharmacy that [place] with ins and $400 with ins and discount card.   [place] t# [phone] ID# [id number] and Jimmy Footman is covered, but pt has $3000 deductible.  After deductible is met then will only be $30 for 30 days and $75 for 90 days.   Trulicity, Victoza and Byetta and all in same tier and will cost the same.  There are no generics in that class. Called pt and explained situation and he can try and afford the $400 temporarily until deductible is met and I advised we can help with samples when we have them.  He is going to come by office and get a sample to try to see if he can use this before he spends the money and have a CMA show him how to use it.

## 2019-01-28 ENCOUNTER — Telehealth: Payer: Self-pay

## 2019-01-28 NOTE — Telephone Encounter (Signed)
needs colomoscopy

## 2019-01-28 NOTE — Telephone Encounter (Signed)
Colonoscopy was done in 2014 and was advise by Dr. Benson Norway office pt is not due until 08-07-21 unless pt is having issues. Last imaging is on it's way Please advsie if pt is having issues . Thanks [employer]

## 2019-02-02 ENCOUNTER — Other Ambulatory Visit: Payer: Self-pay

## 2019-02-02 DIAGNOSIS — Z1211 Encounter for screening for malignant neoplasm of colon: Secondary | ICD-10-CM

## 2019-02-02 NOTE — Telephone Encounter (Signed)
Sent over referral to [facility] . [place]

## 2019-02-07 ENCOUNTER — Other Ambulatory Visit: Payer: Self-pay | Admitting: Family Medicine

## 2019-03-11 ENCOUNTER — Other Ambulatory Visit: Payer: Self-pay | Admitting: Family Medicine

## 2019-03-11 DIAGNOSIS — E1159 Type 2 diabetes mellitus with other circulatory complications: Secondary | ICD-10-CM

## 2019-03-11 DIAGNOSIS — I10 Essential (primary) hypertension: Principal | ICD-10-CM

## 2019-04-26 ENCOUNTER — Other Ambulatory Visit: Payer: Self-pay | Admitting: Family Medicine

## 2019-05-18 ENCOUNTER — Other Ambulatory Visit: Payer: Self-pay | Admitting: Family Medicine

## 2019-05-18 DIAGNOSIS — I152 Hypertension secondary to endocrine disorders: Secondary | ICD-10-CM

## 2019-05-18 DIAGNOSIS — E1159 Type 2 diabetes mellitus with other circulatory complications: Secondary | ICD-10-CM

## 2019-05-19 NOTE — Telephone Encounter (Signed)
Called pt back because he never came to get samples,  He states hasn't came in due to pandemic happened right after we last spoke.  He will come by tomorrow for samples and instructions,  Screening done.

## 2019-05-20 ENCOUNTER — Other Ambulatory Visit: Payer: Managed Care, Other (non HMO)

## 2019-05-20 ENCOUNTER — Other Ambulatory Visit: Payer: Self-pay

## 2019-05-27 ENCOUNTER — Other Ambulatory Visit: Payer: Self-pay | Admitting: Family Medicine

## 2019-05-27 DIAGNOSIS — I152 Hypertension secondary to endocrine disorders: Secondary | ICD-10-CM

## 2019-05-27 DIAGNOSIS — E1159 Type 2 diabetes mellitus with other circulatory complications: Secondary | ICD-10-CM

## 2019-06-16 ENCOUNTER — Encounter: Payer: Self-pay | Admitting: Family Medicine

## 2019-06-16 ENCOUNTER — Other Ambulatory Visit: Payer: Self-pay

## 2019-06-16 ENCOUNTER — Ambulatory Visit (INDEPENDENT_AMBULATORY_CARE_PROVIDER_SITE_OTHER): Payer: Managed Care, Other (non HMO) | Admitting: Family Medicine

## 2019-06-16 VITALS — BP 138/90 | HR 95 | Temp 98.3°F | Wt 340.8 lb

## 2019-06-16 DIAGNOSIS — E1159 Type 2 diabetes mellitus with other circulatory complications: Secondary | ICD-10-CM

## 2019-06-16 DIAGNOSIS — E1169 Type 2 diabetes mellitus with other specified complication: Secondary | ICD-10-CM | POA: Diagnosis not present

## 2019-06-16 DIAGNOSIS — I10 Essential (primary) hypertension: Secondary | ICD-10-CM

## 2019-06-16 DIAGNOSIS — E118 Type 2 diabetes mellitus with unspecified complications: Secondary | ICD-10-CM

## 2019-06-16 DIAGNOSIS — E785 Hyperlipidemia, unspecified: Secondary | ICD-10-CM

## 2019-06-16 DIAGNOSIS — D126 Benign neoplasm of colon, unspecified: Secondary | ICD-10-CM

## 2019-06-16 DIAGNOSIS — I152 Hypertension secondary to endocrine disorders: Secondary | ICD-10-CM

## 2019-06-16 NOTE — Progress Notes (Signed)
  Subjective:    Patient ID: Vincent Cantu, male    DOB: 1971-04-21, 48 y.o.   MRN: [id number]  Vincent Cantu is a 48 y.o. male who presents for follow-up of Type 2 diabetes mellitus.  Patient is checking home blood sugars.   Home blood sugar records: meter record How often is blood sugars being checked: QD fasting 88-141 Current symptoms/problems include none at this time. Daily foot checks:yes   Any foot concerns: no Last eye exam: 11-2018 Exercise: walking QD for an hour He continues on amlodipine, Terrazas and, Edarbychlor for his blood pressure.  He is also taking metformin and having no difficulty with that.  Also atorvastatin is causing no problems.  He is taking a multivitamin.  Does have a history of colonic polyps and is slightly overdue for follow-up.  He is somewhat frustrated over his lack of weight loss with his increased walking. The following portions of the patient's history were reviewed and updated as appropriate: allergies, current medications, past medical history, past social history and problem list.  ROS as in subjective above.     Objective:    Physical Exam Alert and in no distress otherwise not examined. Hemoglobin A1c 6.8  Lab Review Diabetic Labs Latest Ref Rng & Units 01/21/2019 09/22/2018 04/15/2018 12/18/2017 07/30/2017  HbA1c 4.0 - 5.6 % 7.2(A) 7.4(A) 6.8(A) 6.8% 6.8  Microalbumin mg/L - 25.5 - - 39.7  Micro/Creat Ratio - - 14.7 - - 12.3  Chol 100 - 199 mg/dL - 138 - - 141  HDL >39 mg/dL - 29(L) - - 29(L)  Calc LDL 0 - 99 mg/dL - 85 - - 91  Triglycerides 0 - 149 mg/dL - 120 - - 115  Creatinine 0.76 - 1.27 mg/dL - 1.28(H) - - 1.39(H)  GFR >60.00 mL/min - - - - -   BP/Weight 01/21/2019 11/24/2018 09/22/2018 2/54/9826 02/16/5829  Systolic BP 940 768 088 110 315  Diastolic BP 98 82 98 86 945  Wt. (Lbs) 343.6 341 337.2 336.4 338.4  BMI 42.95 42.62 42.15 42.05 42.3   Foot/eye exam completion dates Latest Ref Rng & Units 12/15/2018 12/11/2017  Eye Exam No  Retinopathy No Retinopathy No Retinopathy  Foot Form Completion - - -    Vincent Cantu  reports that he has never smoked. He has never used smokeless tobacco. He reports that he does not drink alcohol or use drugs.     Assessment & Plan:    Type 2 diabetes with complication ([place]) - Plan: Amb Ref to Medical Weight Management, he is ready to get help with his weight has he is so far been unable to make much of his success.  Hyperlipidemia associated with type 2 diabetes mellitus ([place]) - Plan: Continue medications  Hypertension associated with diabetes ([place]) - Plan: Continue present meds  Morbid obesity ([place]) - Plan: Amb Ref to Medical Weight Management,   Adenomatous polyp of colon, unspecified part of colon - Plan: Ambulatory referral to Gastroenterology,     1. Rx changes: none 2. Education: Reviewed 'ABCs' of diabetes management (respective goals in parentheses):  A1C (<7), blood pressure (<130/80), and cholesterol (LDL <100). 3. Compliance at present is estimated to be good. Efforts to improve compliance (if necessary) will be directed at Medical weight loss and wellness. 4. Follow up: 4 months

## 2019-06-25 ENCOUNTER — Other Ambulatory Visit: Payer: Self-pay | Admitting: Family Medicine

## 2019-06-25 DIAGNOSIS — E1169 Type 2 diabetes mellitus with other specified complication: Secondary | ICD-10-CM

## 2019-06-25 DIAGNOSIS — E782 Mixed hyperlipidemia: Secondary | ICD-10-CM

## 2019-07-25 ENCOUNTER — Other Ambulatory Visit: Payer: Self-pay | Admitting: Family Medicine

## 2019-09-03 ENCOUNTER — Other Ambulatory Visit: Payer: Self-pay | Admitting: Family Medicine

## 2019-09-03 DIAGNOSIS — I152 Hypertension secondary to endocrine disorders: Secondary | ICD-10-CM

## 2019-09-03 DIAGNOSIS — E1159 Type 2 diabetes mellitus with other circulatory complications: Secondary | ICD-10-CM

## 2019-09-16 ENCOUNTER — Other Ambulatory Visit (INDEPENDENT_AMBULATORY_CARE_PROVIDER_SITE_OTHER): Payer: Managed Care, Other (non HMO)

## 2019-09-16 ENCOUNTER — Other Ambulatory Visit: Payer: Self-pay

## 2019-09-16 DIAGNOSIS — Z23 Encounter for immunization: Secondary | ICD-10-CM | POA: Diagnosis not present

## 2019-10-01 ENCOUNTER — Other Ambulatory Visit: Payer: Self-pay | Admitting: Family Medicine

## 2019-10-01 DIAGNOSIS — E1169 Type 2 diabetes mellitus with other specified complication: Secondary | ICD-10-CM

## 2019-10-20 ENCOUNTER — Encounter: Payer: Self-pay | Admitting: Family Medicine

## 2019-10-20 ENCOUNTER — Ambulatory Visit (INDEPENDENT_AMBULATORY_CARE_PROVIDER_SITE_OTHER): Payer: Managed Care, Other (non HMO) | Admitting: Family Medicine

## 2019-10-20 ENCOUNTER — Other Ambulatory Visit: Payer: Self-pay

## 2019-10-20 VITALS — BP 132/96 | HR 74 | Temp 96.8°F | Wt 330.8 lb

## 2019-10-20 DIAGNOSIS — E118 Type 2 diabetes mellitus with unspecified complications: Secondary | ICD-10-CM | POA: Diagnosis not present

## 2019-10-20 DIAGNOSIS — E785 Hyperlipidemia, unspecified: Secondary | ICD-10-CM

## 2019-10-20 DIAGNOSIS — E1169 Type 2 diabetes mellitus with other specified complication: Secondary | ICD-10-CM | POA: Diagnosis not present

## 2019-10-20 DIAGNOSIS — I10 Essential (primary) hypertension: Secondary | ICD-10-CM

## 2019-10-20 DIAGNOSIS — E782 Mixed hyperlipidemia: Secondary | ICD-10-CM

## 2019-10-20 DIAGNOSIS — I152 Hypertension secondary to endocrine disorders: Secondary | ICD-10-CM

## 2019-10-20 DIAGNOSIS — E1159 Type 2 diabetes mellitus with other circulatory complications: Secondary | ICD-10-CM

## 2019-10-20 LAB — POCT GLYCOSYLATED HEMOGLOBIN (HGB A1C): Hemoglobin A1C: 6.7 % — AB (ref 4.0–5.6)

## 2019-10-20 LAB — POCT UA - MICROALBUMIN
Albumin/Creatinine Ratio, Urine, POC: 6.2
Creatinine, POC: 121.4 mg/dL
Microalbumin Ur, POC: 7.5 mg/L

## 2019-10-20 MED ORDER — METOPROLOL SUCCINATE ER 200 MG PO TB24: 200.0000 mg | ORAL_TABLET | Freq: Every day | ORAL | 3 refills | Status: DC

## 2019-10-20 MED ORDER — TERAZOSIN HCL 2 MG PO CAPS: 2.0000 mg | ORAL_CAPSULE | Freq: Every day | ORAL | 1 refills | Status: DC

## 2019-10-20 MED ORDER — AMLODIPINE BESYLATE 10 MG PO TABS: 10.0000 mg | ORAL_TABLET | Freq: Every day | ORAL | 3 refills | Status: DC

## 2019-10-20 MED ORDER — EDARBYCLOR 40-25 MG PO TABS: 1.0000 | ORAL_TABLET | Freq: Every day | ORAL | 1 refills | Status: DC

## 2019-10-20 MED ORDER — ATORVASTATIN CALCIUM 20 MG PO TABS: 20.0000 mg | ORAL_TABLET | Freq: Every day | ORAL | 3 refills | Status: DC

## 2019-10-20 MED ORDER — METFORMIN HCL 1000 MG PO TABS: 1000.0000 mg | ORAL_TABLET | Freq: Two times a day (BID) | ORAL | 1 refills | Status: DC

## 2019-10-20 NOTE — Progress Notes (Signed)
  Subjective:    Patient ID: Vincent Cantu, male    DOB: [birth year], 48 y.o.   MRN: [id number]  Vincent Cantu is a 48 y.o. male who presents for follow-up of Type 2 diabetes mellitus.  Home blood sugar records: meter record 113 -130 fasting Current symptoms/problems include none at this time. Daily foot checks:yes   Any foot concerns: none Exercise: walking QD for an hour Diet: reg.  He continues on Edarbychlor, amlodipine and Hytrin for his blood pressure.  He also is taking metoprolol.  He continues on Metformin.  He did take Bydureon for a short period of time but did not continue on it.  He continues on Lipitor and is having no difficulty with that.  He will have an eye exam in the near future. The following portions of the patient's history were reviewed and updated as appropriate: allergies, current medications, past medical history, past social history and problem list.  ROS as in subjective above.     Objective:    Physical Exam Alert and in no distress foot exam is normal.   Lab Review Diabetic Labs Latest Ref Rng & Units 01/21/2019 09/22/2018 04/15/2018 12/18/2017 07/30/2017  HbA1c 4.0 - 5.6 % 7.2(A) 7.4(A) 6.8(A) 6.8% 6.8  Microalbumin mg/L - 25.5 - - 39.7  Micro/Creat Ratio - - 14.7 - - 12.3  Chol 100 - 199 mg/dL - 138 - - 141  HDL >39 mg/dL - 29(L) - - 29(L)  Calc LDL 0 - 99 mg/dL - 85 - - 91  Triglycerides 0 - 149 mg/dL - 120 - - 115  Creatinine 0.76 - 1.27 mg/dL - 1.28(H) - - 1.39(H)  GFR >60.00 mL/min - - - - -   BP/Weight 06/16/2019 01/21/2019 11/24/2018 09/22/2018 [id number]  Systolic BP 0000000 123456 [id number] 0000000 [id number]  Diastolic BP 90 98 82 98 86  Wt. (Lbs) 340.8 343.6 341 337.2 336.4  BMI 42.6 42.95 42.62 42.15 42.05   Foot/eye exam completion dates Latest Ref Rng & Units 12/15/2018 12/11/2017  Eye Exam No Retinopathy No Retinopathy No Retinopathy  Foot Form Completion - - -  A1c is 6.7  [name]  reports that he has never smoked. He has never used smokeless tobacco. He reports  that he does not drink alcohol or use drugs.     Assessment & Plan:    Type 2 diabetes with complication (HCC) - Plan: CBC with Differential, Comprehensive metabolic panel, Lipid panel, POCT UA - Microalbumin  Hyperlipidemia associated with type 2 diabetes mellitus ([place]) - Plan: Lipid panel  Hypertension associated with diabetes ([place]) - Plan: CBC with Differential, Comprehensive metabolic panel  Morbid obesity ([place])   1. Rx changes: none since his A1c is so good, I do not think the Bydureon is needed at this point 2. Education: Reviewed 'ABCs' of diabetes management (respective goals in parentheses):  A1C (<7), blood pressure (<130/80), and cholesterol (LDL <100). 3. Compliance at present is estimated to be excellent. Efforts to improve compliance (if necessary) will be directed at Continue with exercise and try to make more dietary changes.. 4. Follow up: 6 months

## 2019-10-21 LAB — CBC WITH DIFFERENTIAL/PLATELET
Basophils Absolute: 0 10*3/uL (ref 0.0–0.2)
Basos: 1 %
EOS (ABSOLUTE): 0.1 10*3/uL (ref 0.0–0.4)
Eos: 1 %
Hematocrit: 40.3 % (ref 37.5–51.0)
Hemoglobin: 13.1 g/dL (ref 13.0–17.7)
Immature Grans (Abs): 0 10*3/uL (ref 0.0–0.1)
Immature Granulocytes: 1 %
Lymphocytes Absolute: 3 10*3/uL (ref 0.7–3.1)
Lymphs: 46 %
MCH: 25.9 pg — ABNORMAL LOW (ref 26.6–33.0)
MCHC: 32.5 g/dL (ref 31.5–35.7)
MCV: 80 fL (ref 79–97)
Monocytes Absolute: 0.6 10*3/uL (ref 0.1–0.9)
Monocytes: 9 %
Neutrophils Absolute: 2.7 10*3/uL (ref 1.4–7.0)
Neutrophils: 42 %
Platelets: 249 10*3/uL (ref 150–450)
RBC: 5.06 x10E6/uL (ref 4.14–5.80)
RDW: 13.7 % (ref 11.6–15.4)
WBC: 6.5 10*3/uL (ref 3.4–10.8)

## 2019-10-21 LAB — COMPREHENSIVE METABOLIC PANEL
ALT: 34 IU/L (ref 0–44)
AST: 30 IU/L (ref 0–40)
Albumin/Globulin Ratio: 1.5 (ref 1.2–2.2)
Albumin: 4.4 g/dL (ref 4.0–5.0)
Alkaline Phosphatase: 96 IU/L (ref 39–117)
BUN/Creatinine Ratio: 11 (ref 9–20)
BUN: 14 mg/dL (ref 6–24)
Bilirubin Total: 0.5 mg/dL (ref 0.0–1.2)
CO2: 19 mmol/L — ABNORMAL LOW (ref 20–29)
Calcium: 9.7 mg/dL (ref 8.7–10.2)
Chloride: 102 mmol/L (ref 96–106)
Creatinine, Ser: 1.33 mg/dL — ABNORMAL HIGH (ref 0.76–1.27)
GFR calc Af Amer: 72 mL/min/{1.73_m2} (ref >59)
GFR calc non Af Amer: 62 mL/min/{1.73_m2} (ref >59)
Globulin, Total: 3 g/dL (ref 1.5–4.5)
Glucose: 102 mg/dL — ABNORMAL HIGH (ref 65–99)
Potassium: 3.6 mmol/L (ref 3.5–5.2)
Sodium: 140 mmol/L (ref 134–144)
Total Protein: 7.4 g/dL (ref 6.0–8.5)

## 2019-10-21 LAB — LIPID PANEL
Chol/HDL Ratio: 4.5 ratio (ref 0.0–5.0)
Cholesterol, Total: 125 mg/dL (ref 100–199)
HDL: 28 mg/dL — ABNORMAL LOW (ref >39)
LDL Chol Calc (NIH): 77 mg/dL (ref 0–99)
Triglycerides: 105 mg/dL (ref 0–149)
VLDL Cholesterol Cal: 20 mg/dL (ref 5–40)

## 2019-12-13 ENCOUNTER — Ambulatory Visit (INDEPENDENT_AMBULATORY_CARE_PROVIDER_SITE_OTHER): Payer: Managed Care, Other (non HMO) | Admitting: Family Medicine

## 2019-12-13 ENCOUNTER — Encounter (INDEPENDENT_AMBULATORY_CARE_PROVIDER_SITE_OTHER): Payer: Self-pay | Admitting: Family Medicine

## 2019-12-13 ENCOUNTER — Other Ambulatory Visit: Payer: Self-pay

## 2019-12-13 VITALS — BP 142/88 | HR 73 | Temp 98.1°F | Ht 74.0 in | Wt 317.0 lb

## 2019-12-13 DIAGNOSIS — Z0289 Encounter for other administrative examinations: Secondary | ICD-10-CM

## 2019-12-13 DIAGNOSIS — R5383 Other fatigue: Secondary | ICD-10-CM

## 2019-12-13 DIAGNOSIS — Z1331 Encounter for screening for depression: Secondary | ICD-10-CM | POA: Diagnosis not present

## 2019-12-13 DIAGNOSIS — I10 Essential (primary) hypertension: Secondary | ICD-10-CM | POA: Diagnosis not present

## 2019-12-13 DIAGNOSIS — Z9189 Other specified personal risk factors, not elsewhere classified: Secondary | ICD-10-CM | POA: Diagnosis not present

## 2019-12-13 DIAGNOSIS — E1165 Type 2 diabetes mellitus with hyperglycemia: Secondary | ICD-10-CM | POA: Diagnosis not present

## 2019-12-13 DIAGNOSIS — Z6841 Body Mass Index (BMI) 40.0 and over, adult: Secondary | ICD-10-CM

## 2019-12-13 DIAGNOSIS — R0602 Shortness of breath: Secondary | ICD-10-CM

## 2019-12-13 NOTE — Progress Notes (Signed)
Dear Dr. Redmond School,   Thank you for referring Vincent Cantu to our clinic. The following note includes my evaluation and treatment recommendations.  Chief Complaint:   OBESITY ADRON CURY (MR# [id number]) is a 49 y.o. male who presents for evaluation and treatment of obesity and related comorbidities. Ross was referred by Denita Lung, MD. Current BMI is Body mass index is 40.7 kg/m.Marland Kitchen Beni has been struggling with his weight for many years and has been unsuccessful in either losing weight, maintaining weight loss, or reaching his healthy weight goal. The last time he was 250 pounds, was in his mid 12's. He eats lunch out daily. He enjoys simple carbohydrates; both starchy and sweet. Breakfast consists of a pack of oatmeal, apple with raisins and craisins, and 1 hot tea (feels full) with 1 tablespoon of sugar. Lunch consists of [employer] roast beef sandwich, fries and coke (feels full). For dinner he has spaghetti with salad, 1 cup of spaghetti with [place] garden vegetable and 2 cups of salad with Ken's honey mustard dressing. He has chips or ice cream after dinner occasionally and a banana.  Tremon is currently in the action stage of change and ready to dedicate time achieving and maintaining a healthier weight. Gaylard is interested in becoming our patient and working on intensive lifestyle modifications including (but not limited to) diet and exercise for weight loss.  Yohance's habits were reviewed today and are as follows: His family eats meals together, he thinks his family will eat healthier with him, his desired weight loss is 67 pounds, he started gaining weight in 2014, his heaviest weight ever was 345 pounds, he is a picky eater and doesn't like to eat healthier foods, he has significant food cravings issues, he snacks frequently in the evenings, he is frequently drinking liquids with calories, he frequently makes poor food choices, he frequently eats larger portions than normal and  he struggles with emotional eating.  Depression Screen Abhishek's Food and Mood (modified PHQ-9) score was negative.  Depression screen [facility] 2/9 12/13/2019  Decreased Interest 0  Down, Depressed, Hopeless 0  PHQ - 2 Score 0  Altered sleeping 0  Tired, decreased energy 0  Change in appetite 0  Feeling bad or failure about yourself  0  Trouble concentrating 0  Moving slowly or fidgety/restless 0  Suicidal thoughts 0  PHQ-9 Score 0  Difficult doing work/chores Not difficult at all   Subjective:   Other fatigue - Plan: EKG 12-Lead, VITAMIN D 25 Hydroxy (Vit-D Deficiency, Fractures), Vitamin B12, Folate, T3, T4, free, TSH Tres denies daytime somnolence and denies waking up still tired. Patent has a history of symptoms of hypertension. Ishawn generally gets 6 1/2 hours of sleep per night, and states that he has generally restful sleep. Snoring is present. Apneic episodes are not present. Epworth Sleepiness Score is 0.  EKG was ordered today which shows normal sinus rhythm at 69 BPM with inverted T-waves at SVS-V6 unchanged from 2017. Labs were discussed with patient today.  Shortness of breath on exertion Berneta Sages notes increasing shortness of breath with exercising and seems to be worsening over time with weight gain. He notes getting out of breath sooner with activity than he used to. This has not gotten worse recently. Tywayne denies shortness of breath at rest or orthopnea. EKG was ordered today which shows normal sinus rhythm at 69 BPM with inverted T-waves at SVS-V6 unchanged from 2017. Labs were discussed with patient today.  Essential hypertension Talen's blood  pressure is slightly elevated today. He is on Metoprolol, Amlodipine, Terazosin and Edarbyclor (azilsartan/chlorthal). Muzamil denies chest pain, chest pressure or headache.  BP Readings from Last 3 Encounters:  12/13/19 (!) 142/88  10/20/19 (!) 132/96  06/16/19 138/90   Lab Results  Component Value Date   CREATININE 1.33 (H)  10/20/2019   CREATININE 1.28 (H) 09/22/2018   CREATININE 1.39 (H) 07/30/2017   Type 2 diabetes mellitus with hyperglycemia, without long-term current use of insulin ([place]) - Plan: Insulin, random Dewight's last Hgb A1c was 6.7 in December 2020. His last eye exam was last week. Dr. Redmond School checked his feet at his last appointment. Chikamso is on metformin 1,000 mg twice daily. He checks his blood sugars and his blood sugars range between 100 and 124 fasting. Labs were discussed with patient today.  Lab Results  Component Value Date   HGBA1C 6.7 (A) 10/20/2019   HGBA1C 7.2 (A) 01/21/2019   HGBA1C 7.4 (A) 09/22/2018   Lab Results  Component Value Date   MICROALBUR 7.5 10/20/2019   LDLCALC 77 10/20/2019   CREATININE 1.33 (H) 10/20/2019   No results found for: INSULIN  At risk for deficient knowledge of diabetes mellitus  Assessment/Plan:   Other fatigue - Plan: EKG 12-Lead, VITAMIN D 25 Hydroxy (Vit-D Deficiency, Fractures), Vitamin B12, Folate, T3, T4, free, TSH Ritwik does feel that his weight is causing his energy to be lower than it should be. Fatigue may be related to obesity, depression or many other causes. Labs and indirect calorimetry will be ordered, and in the meanwhile, Akhilesh will focus on self care including making healthy food choices, increasing physical activity and focusing on stress reduction.  Shortness of breath on exertion Olan does feel that he gets out of breath more easily that he used to when he exercises. Seanmichael's shortness of breath appears to be obesity related and exercise induced. He has agreed to work on weight loss and gradually increase exercise to treat his exercise induced shortness of breath. Labs and indirect calorimetry will be ordered today. Will continue to monitor closely.  Essential hypertension Mosese is working on healthy weight loss and exercise to improve blood pressure control. We will watch for signs of hypotension as he continues his  lifestyle modifications. We will order EKG and thyroid panel today.  Type 2 diabetes mellitus with hyperglycemia, without long-term current use of insulin (H[facility]) - Plan: Insulin, random Good blood sugar control is important to decrease the likelihood of diabetic complications such as nephropathy, neuropathy, limb loss, blindness, coronary artery disease, and death. Intensive lifestyle modification including diet, exercise and weight loss are the first line of treatment for diabetes. We will check insulin level today.  At risk for deficient knowledge of diabetes mellitus Rileigh was given approximately 15 minutes of diabetes education and counseling today. We discussed intensive lifestyle modifications today with an emphasis on weight loss as well as increasing exercise and decreasing simple carbohydrates in his diet. We also reviewed medication options with an emphasis on risk versus benefit of those discussed.   Class 3 severe obesity with serious comorbidity and body mass index (BMI) of 40.0 to 44.9 in adult, unspecified obesity type [facility]) Terrio is currently in the action stage of change and his goal is to continue with weight loss efforts. I recommend Massai begin the structured treatment plan as follows:  He has agreed to the Category 3 Plan.  Exercise goals: For substantial health benefits, adults should do at least 150 minutes (2 hours and  30 minutes) a week of moderate-intensity, or 75 minutes (1 hour and 15 minutes) a week of vigorous-intensity aerobic physical activity, or an equivalent combination of moderate- and vigorous-intensity aerobic activity. Aerobic activity should be performed in episodes of at least 10 minutes, and preferably, it should be spread throughout the week. Adults should also include muscle-strengthening activities that involve all major muscle groups on 2 or more days a week.   Behavioral modification strategies: increasing lean protein intake, increasing vegetables,  meal planning and cooking strategies, keeping healthy foods in the home and planning for success.  He was informed of the importance of frequent follow-up visits to maximize his success with intensive lifestyle modifications for his multiple health conditions. He was informed we would discuss his lab results at his next visit unless there is a critical issue that needs to be addressed sooner. Reinold agreed to keep his next visit at the agreed upon time to discuss these results.  Objective:   Blood pressure (!) 142/88, pulse 73, temperature 98.1 F (36.7 C), temperature source Oral, height 6\' 2"  (1.88 m), weight (!) 317 lb (143.8 kg), SpO2 99 %. Body mass index is 40.7 kg/m.  EKG: Normal sinus rhythm, rate 69 BPM.  Indirect Calorimeter completed today shows a VO2 of 398 and a REE of 2778.  His calculated basal metabolic rate is 0000000 thus his basal metabolic rate is worse than expected.  General: Cooperative, alert, well developed, in no acute distress. HEENT: Conjunctivae and lids unremarkable. Cardiovascular: Regular rhythm.  Lungs: Normal work of breathing. Neurologic: No focal deficits.   Lab Results  Component Value Date   CREATININE 1.33 (H) 10/20/2019   BUN 14 10/20/2019   NA 140 10/20/2019   K 3.6 10/20/2019   CL 102 10/20/2019   CO2 19 (L) 10/20/2019   Lab Results  Component Value Date   ALT 34 10/20/2019   AST 30 10/20/2019   ALKPHOS 96 10/20/2019   BILITOT 0.5 10/20/2019   Lab Results  Component Value Date   HGBA1C 6.7 (A) 10/20/2019   HGBA1C 7.2 (A) 01/21/2019   HGBA1C 7.4 (A) 09/22/2018   HGBA1C 6.8 (A) 04/15/2018   HGBA1C 6.8% 12/18/2017   No results found for: INSULIN Lab Results  Component Value Date   TSH 1.130 06/06/2014   Lab Results  Component Value Date   CHOL 125 10/20/2019   HDL 28 (L) 10/20/2019   LDLCALC 77 10/20/2019   LDLDIRECT 103.5 01/20/2012   TRIG 105 10/20/2019   CHOLHDL 4.5 10/20/2019   Lab Results  Component Value Date   WBC  6.5 10/20/2019   HGB 13.1 10/20/2019   HCT 40.3 10/20/2019   MCV 80 10/20/2019   PLT 249 10/20/2019   Lab Results  Component Value Date   IRON 61 08/14/2009   Vitamin D There are no recent lab results  Attestation Statements:   Reviewed by clinician on day of visit: allergies, medications, problem list, medical history, surgical history, family history, social history, and previous encounter notes.  I, Doreene Nest, am acting as transcriptionist for Eber Jones, MD.  I have reviewed the above documentation for accuracy and completeness, and I agree with the above. Eber Jones, MD

## 2019-12-14 LAB — TSH: TSH: 1.72 u[IU]/mL (ref 0.450–4.500)

## 2019-12-14 LAB — T4, FREE: Free T4: 1.34 ng/dL (ref 0.82–1.77)

## 2019-12-14 LAB — VITAMIN B12: Vitamin B-12: 239 pg/mL (ref 232–1245)

## 2019-12-14 LAB — INSULIN, RANDOM: INSULIN: 23.4 u[IU]/mL (ref 2.6–24.9)

## 2019-12-14 LAB — FOLATE: Folate: 18.7 ng/mL (ref >3.0)

## 2019-12-14 LAB — VITAMIN D 25 HYDROXY (VIT D DEFICIENCY, FRACTURES): Vit D, 25-Hydroxy: 33.2 ng/mL (ref 30.0–100.0)

## 2019-12-14 LAB — T3: T3, Total: 105 ng/dL (ref 71–180)

## 2019-12-15 LAB — HM DIABETES EYE EXAM

## 2019-12-27 ENCOUNTER — Ambulatory Visit (INDEPENDENT_AMBULATORY_CARE_PROVIDER_SITE_OTHER): Payer: Managed Care, Other (non HMO) | Admitting: Family Medicine

## 2019-12-27 ENCOUNTER — Encounter (INDEPENDENT_AMBULATORY_CARE_PROVIDER_SITE_OTHER): Payer: Self-pay | Admitting: Family Medicine

## 2019-12-27 ENCOUNTER — Other Ambulatory Visit: Payer: Self-pay

## 2019-12-27 VITALS — BP 157/92 | HR 79 | Temp 97.7°F | Ht 74.0 in | Wt 315.0 lb

## 2019-12-27 DIAGNOSIS — Z9189 Other specified personal risk factors, not elsewhere classified: Secondary | ICD-10-CM

## 2019-12-27 DIAGNOSIS — Z6841 Body Mass Index (BMI) 40.0 and over, adult: Secondary | ICD-10-CM

## 2019-12-27 DIAGNOSIS — E559 Vitamin D deficiency, unspecified: Secondary | ICD-10-CM | POA: Diagnosis not present

## 2019-12-27 DIAGNOSIS — I10 Essential (primary) hypertension: Secondary | ICD-10-CM

## 2019-12-27 DIAGNOSIS — E1165 Type 2 diabetes mellitus with hyperglycemia: Secondary | ICD-10-CM

## 2019-12-27 MED ORDER — VITAMIN D (ERGOCALCIFEROL) 1.25 MG (50000 UNIT) PO CAPS: 50000.0000 [IU] | ORAL_CAPSULE | ORAL | 0 refills | Status: DC

## 2019-12-27 NOTE — Progress Notes (Signed)
Chief Complaint:   OBESITY Vincent Cantu is here to discuss his progress with his obesity treatment plan along with follow-up of his obesity related diagnoses. Vincent Cantu is on the Category 3 Plan and states he is following his eating plan approximately 80% of the time. Vincent Cantu states he is walking 60 minutes 6 times per week.  Today's visit was #: 2 Starting weight: 317 lbs Starting date: 12/13/2019 Today's weight: 315 lbs Today's date: 12/27/2019 Total lbs lost to date: 2 Total lbs lost since last in-office visit: 2  Interim History: Vincent Cantu meals were boring for the first 2 weeks. Quantity of food was okay. He is looking for more variety and possibly more cheese. Lunch okay - did the sandwich option. He is getting in 10 ounces at dinner and reporting some hunger around bedtime. He is sometimes not getting all of his snacks in.   Subjective:   Vitamin D deficiency. Vincent Cantu is not on Vitamin D supplementation. He does report fatigue. Last Vitamin D level 33.2 on 12/13/2019.  Essential hypertension. Blood pressure is elevated today. Vincent Cantu just took his blood pressure medications. No chest pain, chest pressure, or headache. He is on amlodipine, Terazosin, and metoprolol.  BP Readings from Last 3 Encounters:  12/27/19 (!) 157/92  12/13/19 (!) 142/88  10/20/19 (!) 132/96   Lab Results  Component Value Date   CREATININE 1.33 (H) 10/20/2019   CREATININE 1.28 (H) 09/22/2018   CREATININE 1.39 (H) 07/30/2017   Type 2 diabetes mellitus with hyperglycemia, without long-term current use of insulin (Vincent Cantu). Vincent Cantu is on metformin only.   Lab Results  Component Value Date   HGBA1C 6.7 (A) 10/20/2019   HGBA1C 7.2 (A) 01/21/2019   HGBA1C 7.4 (A) 09/22/2018   Lab Results  Component Value Date   MICROALBUR 7.5 10/20/2019   LDLCALC 77 10/20/2019   CREATININE 1.33 (H) 10/20/2019   Lab Results  Component Value Date   INSULIN 23.4 12/13/2019   The patient is at a higher than average  risk of deficient intake of food due to not getting all of his food in.   Assessment/Plan:   Vitamin D deficiency. Low Vitamin D level contributes to fatigue and are associated with obesity, breast, and colon cancer. He was given a prescription for Vitamin D, Ergocalciferol, (DRISDOL) 1.25 MG (50000 UNIT) CAPS capsule every week #4 with 0 refills and will follow-up for routine testing of Vitamin D, at least 2-3 times per year to avoid over-replacement.      Essential hypertension. Vincent Cantu is working on healthy weight loss and exercise to improve blood pressure control. We will watch for signs of hypotension as he continues his lifestyle modifications. He will have follow-up blood pressure at his next appointment.   Type 2 diabetes mellitus with hyperglycemia, without long-term current use of insulin (Vincent Cantu). Good blood sugar control is important to decrease the likelihood of diabetic complications such as nephropathy, neuropathy, limb loss, blindness, coronary artery disease, and death. Intensive lifestyle modification including diet, exercise and weight loss are the first line of treatment for diabetes. Vincent Cantu will have repeat labs in 3 months. If prior to lab draw weight still not down, will start GLP-1.  Vincent Cantu was given approximately 15 minutes of deficit intake of food prevention counseling today. Vincent Cantu is at risk for eating too few calories based on current food recall. He was encouraged to focus on meeting caloric and protein goals according to his recommended meal plan.   Class 3 severe obesity with  serious comorbidity and body mass index (BMI) of 40.0 to 44.9 in adult, unspecified obesity type (Vincent Cantu).  Vincent Cantu is currently in the action stage of change. As such, his goal is to continue with weight loss efforts. He has agreed to the Category 3 Plan.   Exercise goals: For substantial health benefits, adults should do at least 150 minutes (2 hours and 30 minutes) a week of moderate-intensity, or 75  minutes (1 hour and 15 minutes) a week of vigorous-intensity aerobic physical activity, or an equivalent combination of moderate- and vigorous-intensity aerobic activity. Aerobic activity should be performed in episodes of at least 10 minutes, and preferably, it should be spread throughout the week.  Behavioral modification strategies: increasing lean protein intake, increasing vegetables, meal planning and cooking strategies, keeping healthy foods in the home and planning for success.  Vincent Cantu has agreed to follow-up with our clinic in 2 weeks. He was informed of the importance of frequent follow-up visits to maximize his success with intensive lifestyle modifications for his multiple health conditions.   Objective:   Blood pressure (!) 157/92, pulse 79, temperature 97.7 F (36.5 C), temperature source Oral, height 6\' 2"  (1.88 m), weight (!) 315 lb (142.9 kg), SpO2 99 %. Body mass index is 40.44 kg/m.  General: Cooperative, alert, well developed, in no acute distress. HEENT: Conjunctivae and lids unremarkable. Cardiovascular: Regular rhythm.  Lungs: Normal work of breathing. Neurologic: No focal deficits.   Lab Results  Component Value Date   CREATININE 1.33 (H) 10/20/2019   BUN 14 10/20/2019   NA 140 10/20/2019   K 3.6 10/20/2019   CL 102 10/20/2019   CO2 19 (L) 10/20/2019   Lab Results  Component Value Date   ALT 34 10/20/2019   AST 30 10/20/2019   ALKPHOS 96 10/20/2019   BILITOT 0.5 10/20/2019   Lab Results  Component Value Date   HGBA1C 6.7 (A) 10/20/2019   HGBA1C 7.2 (A) 01/21/2019   HGBA1C 7.4 (A) 09/22/2018   HGBA1C 6.8 (A) 04/15/2018   HGBA1C 6.8% 12/18/2017   Lab Results  Component Value Date   INSULIN 23.4 12/13/2019   Lab Results  Component Value Date   TSH 1.720 12/13/2019   Lab Results  Component Value Date   CHOL 125 10/20/2019   HDL 28 (L) 10/20/2019   LDLCALC 77 10/20/2019   LDLDIRECT 103.5 01/20/2012   TRIG 105 10/20/2019   CHOLHDL 4.5  10/20/2019   Lab Results  Component Value Date   WBC 6.5 10/20/2019   HGB 13.1 10/20/2019   HCT 40.3 10/20/2019   MCV 80 10/20/2019   PLT 249 10/20/2019   Lab Results  Component Value Date   IRON 61 08/14/2009   Attestation Statements:   Reviewed by clinician on day of visit: allergies, medications, problem list, medical history, surgical history, family history, social history, and previous encounter notes.  I, Michaelene Song, am acting as transcriptionist for [name], MD   I have reviewed the above documentation for accuracy and completeness, and I agree with the above. - [name], MD

## 2020-01-17 ENCOUNTER — Other Ambulatory Visit (INDEPENDENT_AMBULATORY_CARE_PROVIDER_SITE_OTHER): Payer: Self-pay | Admitting: Family Medicine

## 2020-01-17 DIAGNOSIS — E559 Vitamin D deficiency, unspecified: Secondary | ICD-10-CM

## 2020-02-08 ENCOUNTER — Encounter (INDEPENDENT_AMBULATORY_CARE_PROVIDER_SITE_OTHER): Payer: Self-pay | Admitting: Family Medicine

## 2020-02-08 ENCOUNTER — Ambulatory Visit (INDEPENDENT_AMBULATORY_CARE_PROVIDER_SITE_OTHER): Payer: Managed Care, Other (non HMO) | Admitting: Family Medicine

## 2020-02-08 ENCOUNTER — Other Ambulatory Visit: Payer: Self-pay

## 2020-02-08 VITALS — BP 146/84 | HR 77 | Temp 98.0°F | Ht 74.0 in | Wt 315.0 lb

## 2020-02-08 DIAGNOSIS — E1165 Type 2 diabetes mellitus with hyperglycemia: Secondary | ICD-10-CM | POA: Diagnosis not present

## 2020-02-08 DIAGNOSIS — I10 Essential (primary) hypertension: Secondary | ICD-10-CM | POA: Diagnosis not present

## 2020-02-08 DIAGNOSIS — Z6841 Body Mass Index (BMI) 40.0 and over, adult: Secondary | ICD-10-CM | POA: Diagnosis not present

## 2020-02-08 NOTE — Progress Notes (Signed)
Chief Complaint:   OBESITY Vincent Cantu is here to discuss his progress with his obesity treatment plan along with follow-up of his obesity related diagnoses. Vincent Cantu is on the Category 3 Plan and states he is following his eating plan approximately 70-80% of the time. Vincent Cantu states he is walking 60 minutes 6 times per week.  Today's visit was #: 3 Starting weight: 317 lbs Starting date: 12/13/2019 Today's weight: 315 lbs Today's date: 02/08/2020 Total lbs lost to date: 2 Total lbs lost since last in-office visit: 0  Interim History: Vincent Cantu has been eating breakfast and lunch on plan - doing yogurt for breakfast and a Chef's salad at lunch. Dinner is more of a struggle and he reports eating out more. His wife spilled coffee on his meal plan handout and he reports the meal plan was thrown out. He has no plans to go anywhere or do anything in the upcoming weeks.  Subjective:   Type 2 diabetes mellitus with hyperglycemia, without long-term current use of insulin ([place]). Fasting blood sugars are reported to be in the 110's. He denies feelings of hypoglycemia. No cramping, bloating, or diarrhea. He is on metformin.  Lab Results  Component Value Date   HGBA1C 6.7 (A) 10/20/2019   HGBA1C 7.2 (A) 01/21/2019   HGBA1C 7.4 (A) 09/22/2018   Lab Results  Component Value Date   MICROALBUR 7.5 10/20/2019   LDLCALC 77 10/20/2019   CREATININE 1.33 (H) 10/20/2019   Lab Results  Component Value Date   INSULIN 23.4 12/13/2019   Essential hypertension. Blood pressure is slightly elevated today. Vincent Cantu is on amlodipine, Edarbyclor, and metoprolol. No chest pain, chest pressure, or headache.  BP Readings from Last 3 Encounters:  02/08/20 (!) 146/84  12/27/19 (!) 157/92  12/13/19 (!) 142/88   Lab Results  Component Value Date   CREATININE 1.33 (H) 10/20/2019   CREATININE 1.28 (H) 09/22/2018   CREATININE 1.39 (H) 07/30/2017   Assessment/Plan:   Type 2 diabetes mellitus with  hyperglycemia, without long-term current use of insulin (Vincent Cantu). Good blood sugar control is important to decrease the likelihood of diabetic complications such as nephropathy, neuropathy, limb loss, blindness, coronary artery disease, and death. Intensive lifestyle modification including diet, exercise and weight loss are the first line of treatment for diabetes. Will recheck labs in 2 months.  Essential hypertension. Vincent Cantu is working on healthy weight loss and exercise to improve blood pressure control. We will watch for signs of hypotension as he continues his lifestyle modifications. He will have follow-up blood pressure at his next appointment.  Class 3 severe obesity with serious comorbidity and body mass index (BMI) of 40.0 to 44.9 in adult, unspecified obesity type (Vincent Cantu).  Vincent Cantu is currently in the action stage of change. As such, his goal is to continue with weight loss efforts. He has agreed to the Category 3 Plan and will journal 450-650 calories and 40+ grams of protein at supper.   Exercise goals: Vincent Cantu will continue his current exercise regimen.  Behavioral modification strategies: increasing lean protein intake, increasing vegetables, meal planning and cooking strategies, keeping healthy foods in the home and planning for success.  Vincent Cantu has agreed to follow-up with our clinic in 2 weeks. He was informed of the importance of frequent follow-up visits to maximize his success with intensive lifestyle modifications for his multiple health conditions.   Objective:   Blood pressure (!) 146/84, pulse 77, temperature 98 F (36.7 C), temperature source Oral, height 6\' 2"  (1.88 m),  weight (!) 315 lb (142.9 kg), SpO2 98 %. Body mass index is 40.44 kg/m.  General: Cooperative, alert, well developed, in no acute distress. HEENT: Conjunctivae and lids unremarkable. Cardiovascular: Regular rhythm.  Lungs: Normal work of breathing. Neurologic: No focal deficits.   Lab Results  Component  Value Date   CREATININE 1.33 (H) 10/20/2019   BUN 14 10/20/2019   NA 140 10/20/2019   K 3.6 10/20/2019   CL 102 10/20/2019   CO2 19 (L) 10/20/2019   Lab Results  Component Value Date   ALT 34 10/20/2019   AST 30 10/20/2019   ALKPHOS 96 10/20/2019   BILITOT 0.5 10/20/2019   Lab Results  Component Value Date   HGBA1C 6.7 (A) 10/20/2019   HGBA1C 7.2 (A) 01/21/2019   HGBA1C 7.4 (A) 09/22/2018   HGBA1C 6.8 (A) 04/15/2018   HGBA1C 6.8% 12/18/2017   Lab Results  Component Value Date   INSULIN 23.4 12/13/2019   Lab Results  Component Value Date   TSH 1.720 12/13/2019   Lab Results  Component Value Date   CHOL 125 10/20/2019   HDL 28 (L) 10/20/2019   LDLCALC 77 10/20/2019   LDLDIRECT 103.5 01/20/2012   TRIG 105 10/20/2019   CHOLHDL 4.5 10/20/2019   Lab Results  Component Value Date   WBC 6.5 10/20/2019   HGB 13.1 10/20/2019   HCT 40.3 10/20/2019   MCV 80 10/20/2019   PLT 249 10/20/2019   Lab Results  Component Value Date   IRON 61 08/14/2009   Attestation Statements:   Reviewed by clinician on day of visit: allergies, medications, problem list, medical history, surgical history, family history, social history, and previous encounter notes.  Time spent on visit including pre-visit chart review and post-visit charting and care was 15 minutes.   I, Michaelene Song, am acting as transcriptionist for Coralie Common, MD   I have reviewed the above documentation for accuracy and completeness, and I agree with the above. - [name], MD

## 2020-02-29 ENCOUNTER — Other Ambulatory Visit: Payer: Self-pay | Admitting: Family Medicine

## 2020-02-29 DIAGNOSIS — E1159 Type 2 diabetes mellitus with other circulatory complications: Secondary | ICD-10-CM

## 2020-03-02 ENCOUNTER — Encounter (INDEPENDENT_AMBULATORY_CARE_PROVIDER_SITE_OTHER): Payer: Self-pay | Admitting: Family Medicine

## 2020-03-02 ENCOUNTER — Ambulatory Visit (INDEPENDENT_AMBULATORY_CARE_PROVIDER_SITE_OTHER): Payer: Managed Care, Other (non HMO) | Admitting: Family Medicine

## 2020-03-02 ENCOUNTER — Other Ambulatory Visit: Payer: Self-pay

## 2020-03-02 VITALS — BP 121/77 | HR 67 | Temp 97.9°F | Ht 74.0 in | Wt 315.0 lb

## 2020-03-02 DIAGNOSIS — E559 Vitamin D deficiency, unspecified: Secondary | ICD-10-CM

## 2020-03-02 DIAGNOSIS — Z6841 Body Mass Index (BMI) 40.0 and over, adult: Secondary | ICD-10-CM

## 2020-03-02 DIAGNOSIS — E66813 Obesity, class 3: Secondary | ICD-10-CM

## 2020-03-02 DIAGNOSIS — E1165 Type 2 diabetes mellitus with hyperglycemia: Secondary | ICD-10-CM

## 2020-03-02 DIAGNOSIS — Z9189 Other specified personal risk factors, not elsewhere classified: Secondary | ICD-10-CM

## 2020-03-02 MED ORDER — VITAMIN D (ERGOCALCIFEROL) 1.25 MG (50000 UNIT) PO CAPS: 50000.0000 [IU] | ORAL_CAPSULE | ORAL | 0 refills | Status: DC

## 2020-03-16 NOTE — Progress Notes (Signed)
Chief Complaint:   OBESITY Vincent Cantu is here to discuss his progress with his obesity treatment plan along with follow-up of his obesity related diagnoses. Vincent Cantu is on the Category 3 Plan and states he is following his eating plan approximately 80% of the time. Vincent Cantu states he is walking 60 minutes 7 times per week.  Today's visit was #: 4 Starting weight: 317 lbs Starting date: 12/13/2019 Today's weight: 315 lbs Today's date: 03/02/2020 Total lbs lost to date: 2 Total lbs lost since last in-office visit: 0  Interim History: Food-wise, Vincent Cantu has been doing salad and chicken sandwiches; yogurt for breakfast; Lunch is a salad/sandwich and yogurt (not the fruit always); Dinner is a salad with protein (getting in all protein); Snacks are Kellogg's bars. Denies hunger.  Subjective:   Vitamin D deficiency. No nausea, vomiting, or muscle weakness. Vincent Cantu endorses fatigue. He is on prescription Vitamin D.  Last Vitamin D 33.2 on 12/13/2019.  Type 2 diabetes mellitus with hyperglycemia, without long-term current use of insulin (Vincent Cantu). [name] is on metformin 1000 mg BID.   Lab Results  Component Value Date   HGBA1C 6.7 (A) 10/20/2019   HGBA1C 7.2 (A) 01/21/2019   HGBA1C 7.4 (A) 09/22/2018   Lab Results  Component Value Date   MICROALBUR 7.5 10/20/2019   LDLCALC 77 10/20/2019   CREATININE 1.33 (H) 10/20/2019   Lab Results  Component Value Date   INSULIN 23.4 12/13/2019   At risk for osteoporosis. [name] is at higher risk of osteopenia and osteoporosis due to Vitamin D deficiency.   Assessment/Plan:   Vitamin D deficiency. Low Vitamin D level contributes to fatigue and are associated with obesity, breast, and colon cancer. He was given a refill on his Vitamin D, Ergocalciferol, (DRISDOL) 1.25 MG (50000 UNIT) CAPS capsule every week #4 with 0 refills and will follow-up for routine testing of Vitamin D, at least 2-3 times per year to avoid over-replacement.    Type 2  diabetes mellitus with hyperglycemia, without long-term current use of insulin (Vincent Cantu). Good blood sugar control is important to decrease the likelihood of diabetic complications such as nephropathy, neuropathy, limb loss, blindness, coronary artery disease, and death. Intensive lifestyle modification including diet, exercise and weight loss are the first line of treatment for diabetes. Vincent Cantu will follow the Category 4 meal plan and will have labs retested at his next appointment.  At risk for osteoporosis. Vincent Cantu was given approximately 15 minutes of osteoporosis prevention counseling today. Vincent Cantu is at risk for osteopenia and osteoporosis due to his Vitamin D deficiency. He was encouraged to take his Vitamin D and follow his higher calcium diet and increase strengthening exercise to help strengthen his bones and decrease his risk of osteopenia and osteoporosis.  Repetitive spaced learning was employed today to elicit superior memory formation and behavioral change.  Class 3 severe obesity with serious comorbidity and body mass index (BMI) of 40.0 to 44.9 in adult, unspecified obesity type (Vincent Cantu).   Vincent Cantu is currently in the action stage of change. As such, his goal is to continue with weight loss efforts. He has agreed to the Category 4 Plan.   Exercise goals: Vincent Cantu will continue his current exercise regimen.  Behavioral modification strategies: increasing lean protein intake, increasing vegetables, meal planning and cooking strategies, keeping healthy foods in the home and planning for success.  Vincent Cantu has agreed to follow-up with our clinic in 2 weeks. He was informed of the importance of frequent follow-up visits to maximize his  success with intensive lifestyle modifications for his multiple health conditions.   Objective:   Blood pressure 121/77, pulse 67, temperature 97.9 F (36.6 C), temperature source Oral, height 6\' 2"  (1.88 m), weight (!) 315 lb (142.9 kg), SpO2 99 %. Body mass index  is 40.44 kg/m.  General: Cooperative, alert, well developed, in no acute distress. HEENT: Conjunctivae and lids unremarkable. Cardiovascular: Regular rhythm.  Lungs: Normal work of breathing. Neurologic: No focal deficits.   Lab Results  Component Value Date   CREATININE 1.33 (H) 10/20/2019   BUN 14 10/20/2019   NA 140 10/20/2019   K 3.6 10/20/2019   CL 102 10/20/2019   CO2 19 (L) 10/20/2019   Lab Results  Component Value Date   ALT 34 10/20/2019   AST 30 10/20/2019   ALKPHOS 96 10/20/2019   BILITOT 0.5 10/20/2019   Lab Results  Component Value Date   HGBA1C 6.7 (A) 10/20/2019   HGBA1C 7.2 (A) 01/21/2019   HGBA1C 7.4 (A) 09/22/2018   HGBA1C 6.8 (A) 04/15/2018   HGBA1C 6.8% 12/18/2017   Lab Results  Component Value Date   INSULIN 23.4 12/13/2019   Lab Results  Component Value Date   TSH 1.720 12/13/2019   Lab Results  Component Value Date   CHOL 125 10/20/2019   HDL 28 (L) 10/20/2019   LDLCALC 77 10/20/2019   LDLDIRECT 103.5 01/20/2012   TRIG 105 10/20/2019   CHOLHDL 4.5 10/20/2019   Lab Results  Component Value Date   WBC 6.5 10/20/2019   HGB 13.1 10/20/2019   HCT 40.3 10/20/2019   MCV 80 10/20/2019   PLT 249 10/20/2019   Lab Results  Component Value Date   IRON 61 08/14/2009   Attestation Statements:   Reviewed by clinician on day of visit: allergies, medications, problem list, medical history, surgical history, family history, social history, and previous encounter notes.  I, Michaelene Song, am acting as transcriptionist for Coralie Common, MD   I have reviewed the above documentation for accuracy and completeness, and I agree with the above. - [name], MD

## 2020-03-23 ENCOUNTER — Encounter (INDEPENDENT_AMBULATORY_CARE_PROVIDER_SITE_OTHER): Payer: Self-pay | Admitting: Family Medicine

## 2020-03-23 ENCOUNTER — Other Ambulatory Visit: Payer: Self-pay

## 2020-03-23 ENCOUNTER — Ambulatory Visit (INDEPENDENT_AMBULATORY_CARE_PROVIDER_SITE_OTHER): Payer: Managed Care, Other (non HMO) | Admitting: Family Medicine

## 2020-03-23 VITALS — BP 127/82 | HR 64 | Temp 97.6°F | Ht 74.0 in | Wt 313.0 lb

## 2020-03-23 DIAGNOSIS — Z9189 Other specified personal risk factors, not elsewhere classified: Secondary | ICD-10-CM

## 2020-03-23 DIAGNOSIS — E1165 Type 2 diabetes mellitus with hyperglycemia: Secondary | ICD-10-CM

## 2020-03-23 DIAGNOSIS — Z6841 Body Mass Index (BMI) 40.0 and over, adult: Secondary | ICD-10-CM

## 2020-03-23 DIAGNOSIS — E559 Vitamin D deficiency, unspecified: Secondary | ICD-10-CM | POA: Diagnosis not present

## 2020-03-23 DIAGNOSIS — E7849 Other hyperlipidemia: Secondary | ICD-10-CM

## 2020-03-23 MED ORDER — VITAMIN D (ERGOCALCIFEROL) 1.25 MG (50000 UNIT) PO CAPS: 50000.0000 [IU] | ORAL_CAPSULE | ORAL | 0 refills | Status: DC

## 2020-03-23 NOTE — Progress Notes (Signed)
Chief Complaint:   OBESITY Vincent Cantu is here to discuss his progress with his obesity treatment plan along with follow-up of his obesity related diagnoses. Vincent Cantu is on the Category 4 Plan and states he is following his eating plan approximately 60% of the time. Vincent Cantu states he is walking for 60 minutes 7 times per week.  Today's visit was #: 5 Starting weight: 317 lbs Starting date: 12/13/2019 Today's weight: 313 lbs Today's date: 03/23/2020 Total lbs lost to date: 4 lbs Total lbs lost since last in-office visit: 2 lbs  Interim History: Vincent Cantu is having yogurt, milk, and bread for breakfast.  For lunch, he has a romaine chef salad with cheese, tomato, [place] ham, eggs (2).  He will have an apple with peanut butter for a snack.  Dinner will be salmon (8 ounces) with 2 cups of vegetables.  Snacks include a Kellogg's bar.  Denies hunger.  He is eating out when not planning for dinner.  He occasionally goes to [place].  Subjective:   1. Vitamin D deficiency Vincent Cantu Vitamin D level was 33.2 on 12/13/2019. He is currently taking prescription vitamin D 50,000 IU each week. He denies nausea, vomiting or muscle weakness.  He endorses fatigue.  2. Other hyperlipidemia Vincent Cantu has hyperlipidemia and has been trying to improve his cholesterol levels with intensive lifestyle modification including a low saturated fat diet, exercise and weight loss. He denies any chest pain, claudication or myalgias.  He is taking Lipitor.  Lab Results  Component Value Date   ALT 34 10/20/2019   AST 30 10/20/2019   ALKPHOS 96 10/20/2019   BILITOT 0.5 10/20/2019   Lab Results  Component Value Date   CHOL 125 10/20/2019   HDL 28 (L) 10/20/2019   LDLCALC 77 10/20/2019   LDLDIRECT 103.5 01/20/2012   TRIG 105 10/20/2019   CHOLHDL 4.5 10/20/2019   3. Type 2 diabetes mellitus with hyperglycemia, without long-term current use of insulin (HCC) Vincent Cantu denies feelings of hypoglycemia.  No GI side effects on  metformin.  Lab Results  Component Value Date   HGBA1C 6.7 (A) 10/20/2019   HGBA1C 7.2 (A) 01/21/2019   HGBA1C 7.4 (A) 09/22/2018   Lab Results  Component Value Date   MICROALBUR 7.5 10/20/2019   LDLCALC 77 10/20/2019   CREATININE 1.33 (H) 10/20/2019   Lab Results  Component Value Date   INSULIN 23.4 12/13/2019   4. At risk for osteoporosis Vincent Cantu is at higher risk of osteopenia and osteoporosis due to Vitamin D deficiency.   Assessment/Plan:   1. Vitamin D deficiency Low Vitamin D level contributes to fatigue and are associated with obesity, breast, and colon cancer. He agrees to continue to take prescription Vitamin D @50 ,000 IU every week and will follow-up for routine testing of Vitamin D, at least 2-3 times per year to avoid over-replacement. - Vitamin D, Ergocalciferol, (DRISDOL) 1.25 MG (50000 UNIT) CAPS capsule; Take 1 capsule (50,000 Units total) by mouth every 7 (seven) days.  Dispense: 4 capsule; Refill: 0 - VITAMIN D 25 Hydroxy (Vit-D Deficiency, Fractures)  2. Other hyperlipidemia Cardiovascular risk and specific lipid/LDL goals reviewed.  We discussed several lifestyle modifications today and Vincent Cantu will continue to work on diet, exercise and weight loss efforts. Orders and follow up as documented in patient record.   Counseling Intensive lifestyle modifications are the first line treatment for this issue. . Dietary changes: Increase soluble fiber. Decrease simple carbohydrates. . Exercise changes: Moderate to vigorous-intensity aerobic activity 150 minutes per week  if tolerated. . Lipid-lowering medications: see documented in medical record. - Lipid Panel With LDL/HDL Ratio  3. Type 2 diabetes mellitus with hyperglycemia, without long-term current use of insulin (HCC) Good blood sugar control is important to decrease the likelihood of diabetic complications such as nephropathy, neuropathy, limb loss, blindness, coronary artery disease, and death. Intensive  lifestyle modification including diet, exercise and weight loss are the first line of treatment for diabetes.  - Comprehensive metabolic panel - Hemoglobin A1c - Insulin, random  4. At risk for osteoporosis Vincent Cantu was given approximately 15 minutes of osteoporosis prevention counseling today. Vincent Cantu is at risk for osteopenia and osteoporosis due to his Vitamin D deficiency. He was encouraged to take his Vitamin D and follow his higher calcium diet and increase strengthening exercise to help strengthen his bones and decrease his risk of osteopenia and osteoporosis.  Repetitive spaced learning was employed today to elicit superior memory formation and behavioral change.  5. Class 3 severe obesity with serious comorbidity and body mass index (BMI) of 40.0 to 44.9 in adult, unspecified obesity type [facility]) Vincent Cantu is currently in the action stage of change. As such, his goal is to continue with weight loss efforts. He has agreed to the Category 4 Plan.   Exercise goals: As is.  Behavioral modification strategies: increasing lean protein intake and meal planning and cooking strategies.  Vincent Cantu has agreed to follow-up with our clinic in 2-3 weeks. He was informed of the importance of frequent follow-up visits to maximize his success with intensive lifestyle modifications for his multiple health conditions.   Vincent Cantu was informed we would discuss his lab results at his next visit unless there is a critical issue that needs to be addressed sooner. Vincent Cantu agreed to keep his next visit at the agreed upon time to discuss these results.  Objective:   Blood pressure 127/82, pulse 64, temperature 97.6 F (36.4 C), temperature source Oral, height 6\' 2"  (1.88 m), weight (!) 313 lb (142 kg), SpO2 98 %. Body mass index is 40.19 kg/m.  General: Cooperative, alert, well developed, in no acute distress. HEENT: Conjunctivae and lids unremarkable. Cardiovascular: Regular rhythm.  Lungs: Normal work of  breathing. Neurologic: No focal deficits.   Lab Results  Component Value Date   CREATININE 1.33 (H) 10/20/2019   BUN 14 10/20/2019   NA 140 10/20/2019   K 3.6 10/20/2019   CL 102 10/20/2019   CO2 19 (L) 10/20/2019   Lab Results  Component Value Date   ALT 34 10/20/2019   AST 30 10/20/2019   ALKPHOS 96 10/20/2019   BILITOT 0.5 10/20/2019   Lab Results  Component Value Date   HGBA1C 6.7 (A) 10/20/2019   HGBA1C 7.2 (A) 01/21/2019   HGBA1C 7.4 (A) 09/22/2018   HGBA1C 6.8 (A) 04/15/2018   HGBA1C 6.8% 12/18/2017   Lab Results  Component Value Date   INSULIN 23.4 12/13/2019   Lab Results  Component Value Date   TSH 1.720 12/13/2019   Lab Results  Component Value Date   CHOL 125 10/20/2019   HDL 28 (L) 10/20/2019   LDLCALC 77 10/20/2019   LDLDIRECT 103.5 01/20/2012   TRIG 105 10/20/2019   CHOLHDL 4.5 10/20/2019   Lab Results  Component Value Date   WBC 6.5 10/20/2019   HGB 13.1 10/20/2019   HCT 40.3 10/20/2019   MCV 80 10/20/2019   PLT 249 10/20/2019   Lab Results  Component Value Date   IRON 61 08/14/2009   Attestation Statements:  Reviewed by clinician on day of visit: allergies, medications, problem list, medical history, surgical history, family history, social history, and previous encounter notes.  I, Water quality scientist, CMA, am acting as transcriptionist for Coralie Common, MD.  I have reviewed the above documentation for accuracy and completeness, and I agree with the above. - Jinny Blossom, MD

## 2020-03-24 ENCOUNTER — Other Ambulatory Visit (INDEPENDENT_AMBULATORY_CARE_PROVIDER_SITE_OTHER): Payer: Self-pay | Admitting: Family Medicine

## 2020-03-24 DIAGNOSIS — E559 Vitamin D deficiency, unspecified: Secondary | ICD-10-CM

## 2020-03-24 LAB — COMPREHENSIVE METABOLIC PANEL
ALT: 24 IU/L (ref 0–44)
AST: 22 IU/L (ref 0–40)
Albumin/Globulin Ratio: 1.5 (ref 1.2–2.2)
Albumin: 4.4 g/dL (ref 3.8–4.9)
Alkaline Phosphatase: 91 IU/L (ref 39–117)
BUN/Creatinine Ratio: 13 (ref 9–20)
BUN: 19 mg/dL (ref 6–24)
Bilirubin Total: 0.3 mg/dL (ref 0.0–1.2)
CO2: 23 mmol/L (ref 20–29)
Calcium: 9.9 mg/dL (ref 8.7–10.2)
Chloride: 104 mmol/L (ref 96–106)
Creatinine, Ser: 1.52 mg/dL — ABNORMAL HIGH (ref 0.76–1.27)
GFR calc Af Amer: 60 mL/min/{1.73_m2} (ref >59)
GFR calc non Af Amer: 52 mL/min/{1.73_m2} — ABNORMAL LOW (ref >59)
Globulin, Total: 2.9 g/dL (ref 1.5–4.5)
Glucose: 104 mg/dL — ABNORMAL HIGH (ref 65–99)
Potassium: 3.8 mmol/L (ref 3.5–5.2)
Sodium: 144 mmol/L (ref 134–144)
Total Protein: 7.3 g/dL (ref 6.0–8.5)

## 2020-03-24 LAB — LIPID PANEL WITH LDL/HDL RATIO
Cholesterol, Total: 131 mg/dL (ref 100–199)
HDL: 29 mg/dL — ABNORMAL LOW (ref >39)
LDL Chol Calc (NIH): 82 mg/dL (ref 0–99)
LDL/HDL Ratio: 2.8 ratio (ref 0.0–3.6)
Triglycerides: 104 mg/dL (ref 0–149)
VLDL Cholesterol Cal: 20 mg/dL (ref 5–40)

## 2020-03-24 LAB — HEMOGLOBIN A1C
Est. average glucose Bld gHb Est-mCnc: 143 mg/dL
Hgb A1c MFr Bld: 6.6 % — ABNORMAL HIGH (ref 4.8–5.6)

## 2020-03-24 LAB — INSULIN, RANDOM: INSULIN: 30.6 u[IU]/mL — ABNORMAL HIGH (ref 2.6–24.9)

## 2020-03-24 LAB — VITAMIN D 25 HYDROXY (VIT D DEFICIENCY, FRACTURES): Vit D, 25-Hydroxy: 45.9 ng/mL (ref 30.0–100.0)

## 2020-04-18 ENCOUNTER — Ambulatory Visit (INDEPENDENT_AMBULATORY_CARE_PROVIDER_SITE_OTHER): Payer: Managed Care, Other (non HMO) | Admitting: Family Medicine

## 2020-04-19 ENCOUNTER — Other Ambulatory Visit: Payer: Self-pay

## 2020-04-19 ENCOUNTER — Encounter: Payer: Self-pay | Admitting: Family Medicine

## 2020-04-19 ENCOUNTER — Ambulatory Visit (INDEPENDENT_AMBULATORY_CARE_PROVIDER_SITE_OTHER): Payer: Managed Care, Other (non HMO) | Admitting: Family Medicine

## 2020-04-19 ENCOUNTER — Ambulatory Visit
Admission: RE | Admit: 2020-04-19 | Discharge: 2020-04-19 | Disposition: A | Discharge status: Home / Self Care | Payer: 59 | Source: Ambulatory Visit | Attending: Family Medicine | Admitting: Family Medicine

## 2020-04-19 VITALS — BP 120/82 | HR 81 | Temp 97.8°F | Wt 323.8 lb

## 2020-04-19 DIAGNOSIS — E66813 Obesity, class 3: Secondary | ICD-10-CM

## 2020-04-19 DIAGNOSIS — E559 Vitamin D deficiency, unspecified: Secondary | ICD-10-CM

## 2020-04-19 DIAGNOSIS — I10 Essential (primary) hypertension: Secondary | ICD-10-CM

## 2020-04-19 DIAGNOSIS — E785 Hyperlipidemia, unspecified: Secondary | ICD-10-CM

## 2020-04-19 DIAGNOSIS — M542 Cervicalgia: Secondary | ICD-10-CM | POA: Diagnosis not present

## 2020-04-19 DIAGNOSIS — E1165 Type 2 diabetes mellitus with hyperglycemia: Secondary | ICD-10-CM

## 2020-04-19 DIAGNOSIS — E1169 Type 2 diabetes mellitus with other specified complication: Secondary | ICD-10-CM

## 2020-04-19 DIAGNOSIS — Z6841 Body Mass Index (BMI) 40.0 and over, adult: Secondary | ICD-10-CM

## 2020-04-19 NOTE — Patient Instructions (Signed)
Heat for 20 minutes 3 times per day and gentle stretching after that.  2 Tylenol 4 times per day and if you need more than I would probably go with 2 Aleve twice per day

## 2020-04-19 NOTE — Progress Notes (Signed)
Subjective:    Patient ID: Vincent Cantu, male    DOB: 29-Jun-1971, 49 y.o.   MRN: [id number]  Vincent Cantu is a 49 y.o. male who presents for follow-up of Type 2 diabetes mellitus.  Home blood sugar records: meter records , fasting only , 109-151 Current symptoms/problems include none at this time. Daily foot checks: yes   Any foot concerns: none Exercise: walking 3 miles QD Diet: He states that he does well with breakfast and lunch but dinner is hard for him to maintain a regular eating pattern since both he and his wife work. He is followed at [facility]. His most recent hemoglobin A1c was 6.6. He is presently taking 50,000 units weekly of vitamin D. Continues on his blood pressure medications and is having no difficulty with them as well as taking atorvastatin without aches or pains. Continues on Metformin without trouble. He does complain of a 65-month history of left-sided neck pain that radiates to the shoulder. He has had no numbness tingling or weakness. He has been doing some neck rehab that he was given. He is also seeing chiropractor without relief of his symptoms. The following portions of the patient's history were reviewed and updated as appropriate: allergies, current medications, past medical history, past social history and problem list.  ROS as in subjective above.     Objective:    Physical Exam Alert and in no distress. Full motion of the neck with pain at extremes of all directions. Normal motor, sensory and DTRs of arms.  Hemoglobin A1c is 6.6 Lab Review Diabetic Labs Latest Ref Rng & Units 03/23/2020 10/20/2019 01/21/2019 09/22/2018 04/15/2018  HbA1c 4.8 - 5.6 % 6.6(H) 6.7(A) 7.2(A) 7.4(A) 6.8(A)  Microalbumin mg/L - 7.5 - 25.5 -  Micro/Creat Ratio - - 6.2 - 14.7 -  Chol 100 - 199 mg/dL 131 125 - 138 -  HDL >39 mg/dL 29(L) 28(L) - 29(L) -  Calc LDL 0 - 99 mg/dL 82 77 - 85 -  Triglycerides 0 - 149 mg/dL 104 105 - 120 -  Creatinine 0.76 - 1.27  mg/dL 1.52(H) 1.33(H) - 1.28(H) -  GFR >60.00 mL/min - - - - -   BP/Weight 03/23/2020 03/02/2020 02/08/2020 12/27/2019 [id number]  Systolic BP [id number]  Diastolic BP 82 77 84 92 88  Wt. (Lbs) 313 315 315 315 317  BMI 40.19 40.44 40.44 40.44 40.7   Foot/eye exam completion dates Latest Ref Rng & Units 12/15/2019 10/20/2019  Eye Exam No Retinopathy No Retinopathy -  Foot Form Completion - - Done    [place]  reports that he has never smoked. He has never used smokeless tobacco. He reports that he does not drink alcohol or use drugs.     Assessment & Plan:    Type 2 diabetes mellitus with hyperglycemia, without long-term current use of insulin (HCC)  Neck pain - Plan: DG Cervical Spine Complete  Vitamin D deficiency  Class 3 severe obesity with serious comorbidity and body mass index (BMI) of 40.0 to 44.9 in adult, unspecified obesity type ([place])  Essential hypertension  Hyperlipidemia associated with type 2 diabetes mellitus ([place])   1. Rx changes: none 2. Education: Reviewed 'ABCs' of diabetes management (respective goals in parentheses):  A1C (<7), blood pressure (<130/80), and cholesterol (LDL <100). 3. Compliance at present is estimated to be fair. Efforts to improve compliance (if necessary) will be directed at changing his cooking habits and do his mini things over the  weekend to get ready for the week. Also potentially involve his children helping cooking the meals to make this more of a family effort.  4. Follow up: 6 months  We will also follow-up on the x-ray and possibly refer to physical therapy for more formal therapeutics. Also recommend regular dosing of Tylenol and also possibly using Aleve.

## 2020-04-22 ENCOUNTER — Other Ambulatory Visit (INDEPENDENT_AMBULATORY_CARE_PROVIDER_SITE_OTHER): Payer: Self-pay | Admitting: Family Medicine

## 2020-04-22 DIAGNOSIS — E559 Vitamin D deficiency, unspecified: Secondary | ICD-10-CM

## 2020-04-25 ENCOUNTER — Other Ambulatory Visit: Payer: Self-pay | Admitting: Family Medicine

## 2020-04-25 DIAGNOSIS — E118 Type 2 diabetes mellitus with unspecified complications: Secondary | ICD-10-CM

## 2020-05-18 ENCOUNTER — Encounter (INDEPENDENT_AMBULATORY_CARE_PROVIDER_SITE_OTHER): Payer: Self-pay

## 2020-05-23 ENCOUNTER — Ambulatory Visit (INDEPENDENT_AMBULATORY_CARE_PROVIDER_SITE_OTHER): Payer: Managed Care, Other (non HMO) | Admitting: Family Medicine

## 2020-05-23 ENCOUNTER — Other Ambulatory Visit: Payer: Self-pay

## 2020-05-23 ENCOUNTER — Encounter (INDEPENDENT_AMBULATORY_CARE_PROVIDER_SITE_OTHER): Payer: Self-pay | Admitting: Family Medicine

## 2020-05-23 VITALS — BP 119/74 | HR 62 | Temp 97.8°F | Ht 74.0 in | Wt 316.0 lb

## 2020-05-23 DIAGNOSIS — Z6841 Body Mass Index (BMI) 40.0 and over, adult: Secondary | ICD-10-CM

## 2020-05-23 DIAGNOSIS — I10 Essential (primary) hypertension: Secondary | ICD-10-CM

## 2020-05-23 DIAGNOSIS — E1165 Type 2 diabetes mellitus with hyperglycemia: Secondary | ICD-10-CM

## 2020-05-24 NOTE — Progress Notes (Signed)
Chief Complaint:   OBESITY Vincent Cantu is here to discuss his progress with his obesity treatment plan along with follow-up of his obesity related diagnoses. Vincent Cantu is on the Category 4 Plan and states he is following his eating plan approximately 30% of the time. Vincent Cantu states he is walking for 30 minutes 5 times per week.  Today's visit was #: 6 Starting weight: 317 lbs Starting date: 12/13/2019 Today's weight: 316 lbs Today's date: 05/23/2020 Total lbs lost to date: 1 Total lbs lost since last in-office visit: 0  Interim History: Vincent Cantu returns to our clinic today since May. He went on vacation to [place] for a week and just got back 2 days ago. He has food in the house for Category 4. He has no plans for travel for the up coming weeks, and he denies obstacles.  Subjective:   1. Type 2 diabetes mellitus with hyperglycemia, without long-term current use of insulin (HCC) [name]'s BGs average at 120 and lowest BGs at 100. He is on metformin.  2. Essential hypertension [place]'s blood pressure is very well controlled. He denies chest pain, chest pressure, or headaches.  Assessment/Plan:   1. Type 2 diabetes mellitus with hyperglycemia, without long-term current use of insulin (HCC) Good blood sugar control is important to decrease the likelihood of diabetic complications such as nephropathy, neuropathy, limb loss, blindness, coronary artery disease, and death. Intensive lifestyle modification including diet, exercise and weight loss are the first line of treatment for diabetes. Vincent Cantu will continue metformin and will follow up as directed.  2. Essential hypertension Vincent Cantu is working on healthy weight loss and exercise to improve blood pressure control. We will watch for signs of hypotension as he continues his lifestyle modifications. Jaegar will continue his medications with no change in dose.  3. Class 3 severe obesity with serious comorbidity and body mass index (BMI) of 40.0 to  44.9 in adult, unspecified obesity type [name]) [name] is currently in the action stage of change. As such, his goal is to continue with weight loss efforts. He has agreed to the Category 4 Plan.   Exercise goals: No exercise has been prescribed at this time.  Behavioral modification strategies: increasing lean protein intake, increasing vegetables, meal planning and cooking strategies, keeping healthy foods in the home and planning for success.  Vincent Cantu has agreed to follow-up with our clinic in 2 weeks. He was informed of the importance of frequent follow-up visits to maximize his success with intensive lifestyle modifications for his multiple health conditions.   Objective:   Blood pressure 119/74, pulse 62, temperature 97.8 F (36.6 C), temperature source Oral, height 6\' 2"  (1.88 m), weight (!) 316 lb (143.3 kg), SpO2 100 %. Body mass index is 40.57 kg/m.  General: Cooperative, alert, well developed, in no acute distress. HEENT: Conjunctivae and lids unremarkable. Cardiovascular: Regular rhythm.  Lungs: Normal work of breathing. Neurologic: No focal deficits.   Lab Results  Component Value Date   CREATININE 1.52 (H) 03/23/2020   BUN 19 03/23/2020   NA 144 03/23/2020   K 3.8 03/23/2020   CL 104 03/23/2020   CO2 23 03/23/2020   Lab Results  Component Value Date   ALT 24 03/23/2020   AST 22 03/23/2020   ALKPHOS 91 03/23/2020   BILITOT 0.3 03/23/2020   Lab Results  Component Value Date   HGBA1C 6.6 (H) 03/23/2020   HGBA1C 6.7 (A) 10/20/2019   HGBA1C 7.2 (A) 01/21/2019   HGBA1C 7.4 (A) 09/22/2018  HGBA1C 6.8 (A) 04/15/2018   Lab Results  Component Value Date   INSULIN 30.6 (H) 03/23/2020   INSULIN 23.4 12/13/2019   Lab Results  Component Value Date   TSH 1.720 12/13/2019   Lab Results  Component Value Date   CHOL 131 03/23/2020   HDL 29 (L) 03/23/2020   LDLCALC 82 03/23/2020   LDLDIRECT 103.5 01/20/2012   TRIG 104 03/23/2020   CHOLHDL 4.5 10/20/2019   Lab  Results  Component Value Date   WBC 6.5 10/20/2019   HGB 13.1 10/20/2019   HCT 40.3 10/20/2019   MCV 80 10/20/2019   PLT 249 10/20/2019   Lab Results  Component Value Date   IRON 61 08/14/2009   Attestation Statements:   Reviewed by clinician on day of visit: allergies, medications, problem list, medical history, surgical history, family history, social history, and previous encounter notes.  Time spent on visit including pre-visit chart review and post-visit care and charting was 15 minutes.    I, Trixie Dredge, am acting as transcriptionist for Coralie Common, MD.  I have reviewed the above documentation for accuracy and completeness, and I agree with the above. - Jinny Blossom, MD

## 2020-05-29 ENCOUNTER — Other Ambulatory Visit: Payer: Self-pay | Admitting: Family Medicine

## 2020-05-29 DIAGNOSIS — I152 Hypertension secondary to endocrine disorders: Secondary | ICD-10-CM

## 2020-05-29 DIAGNOSIS — E1159 Type 2 diabetes mellitus with other circulatory complications: Secondary | ICD-10-CM

## 2020-06-06 ENCOUNTER — Ambulatory Visit (INDEPENDENT_AMBULATORY_CARE_PROVIDER_SITE_OTHER): Payer: Managed Care, Other (non HMO) | Admitting: Family Medicine

## 2020-06-06 ENCOUNTER — Encounter (INDEPENDENT_AMBULATORY_CARE_PROVIDER_SITE_OTHER): Payer: Self-pay | Admitting: Family Medicine

## 2020-06-06 ENCOUNTER — Other Ambulatory Visit: Payer: Self-pay

## 2020-06-06 VITALS — BP 119/80 | HR 71 | Temp 98.3°F | Ht 74.0 in | Wt 316.0 lb

## 2020-06-06 DIAGNOSIS — E1165 Type 2 diabetes mellitus with hyperglycemia: Secondary | ICD-10-CM | POA: Diagnosis not present

## 2020-06-06 DIAGNOSIS — Z9189 Other specified personal risk factors, not elsewhere classified: Secondary | ICD-10-CM

## 2020-06-06 DIAGNOSIS — E559 Vitamin D deficiency, unspecified: Secondary | ICD-10-CM | POA: Diagnosis not present

## 2020-06-06 DIAGNOSIS — Z6841 Body Mass Index (BMI) 40.0 and over, adult: Secondary | ICD-10-CM

## 2020-06-06 MED ORDER — RYBELSUS 3 MG PO TABS: 3.0000 mg | ORAL_TABLET | Freq: Every morning | ORAL | 0 refills | Status: DC

## 2020-06-06 NOTE — Telephone Encounter (Signed)
Please advise 

## 2020-06-08 NOTE — Progress Notes (Signed)
Chief Complaint:   OBESITY Vincent Cantu is here to discuss his progress with his obesity treatment plan along with follow-up of his obesity related diagnoses. Vincent Cantu is on the Category 4 Plan and states he is following his eating plan approximately 60% of the time. Vincent Cantu states he is walking 3 miles 6 times per week.  Today's visit was #: 7 Starting weight: 317 lbs Starting date: 12/13/2019 Today's weight: 316 lbs Today's date: 06/06/2020 Total lbs lost to date: 1 Total lbs lost since last in-office visit: 0  Interim History: Vincent Cantu voices the last few weeks he has been getting as on the plan as he can. Milk and yogurt in the morning, salad in the afternoon, and dinner is salmon and broccoli. He doesn't eat much bread and he is eating snack calories for salad dressing. He is eating out but trying to make good choices.  Subjective:   1. Type 2 diabetes mellitus with hyperglycemia, without long-term current use of insulin (HCC) Vincent Cantu is on metformin 1,000 mg daily. He denies GI side effects of metformin.  2. Vitamin D deficiency Vincent Cantu's blood pressure is well controlled. He denies chest pain, chest pressure, or headaches.  3. At risk for heart disease Vincent Cantu is at a higher than average risk for cardiovascular disease due to obesity.   Assessment/Plan:   1. Type 2 diabetes mellitus with hyperglycemia, without long-term current use of insulin (HCC) Good blood sugar control is important to decrease the likelihood of diabetic complications such as nephropathy, neuropathy, limb loss, blindness, coronary artery disease, and death. Intensive lifestyle modification including diet, exercise and weight loss are the first line of treatment for diabetes. [name] agreed to start Rybelsus 3 mg PO q AM with no refills.  - Semaglutide (RYBELSUS) 3 MG TABS; Take 3 mg by mouth in the morning.  Dispense: 30 tablet; Refill: 0  2. Vitamin D deficiency Low Vitamin D level contributes to fatigue and are  associated with obesity, breast, and colon cancer. Vincent Cantu agreed to continue taking prescription Vitamin D 50,000 IU every week, no refill needed. He will follow-up for routine testing of Vitamin D, at least 2-3 times per year to avoid over-replacement.  3. At risk for heart disease Vincent Cantu was given approximately 15 minutes of coronary artery disease prevention counseling today. He is 49 y.o. male and has risk factors for heart disease including obesity. We discussed intensive lifestyle modifications today with an emphasis on specific weight loss instructions and strategies.   Repetitive spaced learning was employed today to elicit superior memory formation and behavioral change.  4. Class 3 severe obesity with serious comorbidity and body mass index (BMI) of 40.0 to 44.9 in adult, unspecified obesity type Vincent Cantu) Vincent Cantu is currently in the action stage of change. As such, his goal is to continue with weight loss efforts. He has agreed to the Category 4 Plan.   Exercise goals: All adults should avoid inactivity. Some physical activity is better than none, and adults who participate in any amount of physical activity gain some health benefits.  Behavioral modification strategies: increasing lean protein intake, meal planning and cooking strategies, keeping healthy foods in the home and planning for success.  Vincent Cantu has agreed to follow-up with our clinic in 2 to 3 weeks. He was informed of the importance of frequent follow-up visits to maximize his success with intensive lifestyle modifications for his multiple health conditions.   Objective:   Blood pressure 119/80, pulse 71, temperature 98.3 F (36.8 C), temperature source  Oral, height 6\' 2"  (1.88 m), weight (!) 316 lb (143.3 kg), SpO2 100 %. Body mass index is 40.57 kg/m.  General: Cooperative, alert, well developed, in no acute distress. HEENT: Conjunctivae and lids unremarkable. Cardiovascular: Regular rhythm.  Lungs: Normal work of  breathing. Neurologic: No focal deficits.   Lab Results  Component Value Date   CREATININE 1.52 (H) 03/23/2020   BUN 19 03/23/2020   NA 144 03/23/2020   K 3.8 03/23/2020   CL 104 03/23/2020   CO2 23 03/23/2020   Lab Results  Component Value Date   ALT 24 03/23/2020   AST 22 03/23/2020   ALKPHOS 91 03/23/2020   BILITOT 0.3 03/23/2020   Lab Results  Component Value Date   HGBA1C 6.6 (H) 03/23/2020   HGBA1C 6.7 (A) 10/20/2019   HGBA1C 7.2 (A) 01/21/2019   HGBA1C 7.4 (A) 09/22/2018   HGBA1C 6.8 (A) 04/15/2018   Lab Results  Component Value Date   INSULIN 30.6 (H) 03/23/2020   INSULIN 23.4 12/13/2019   Lab Results  Component Value Date   TSH 1.720 12/13/2019   Lab Results  Component Value Date   CHOL 131 03/23/2020   HDL 29 (L) 03/23/2020   LDLCALC 82 03/23/2020   LDLDIRECT 103.5 01/20/2012   TRIG 104 03/23/2020   CHOLHDL 4.5 10/20/2019   Lab Results  Component Value Date   WBC 6.5 10/20/2019   HGB 13.1 10/20/2019   HCT 40.3 10/20/2019   MCV 80 10/20/2019   PLT 249 10/20/2019   Lab Results  Component Value Date   IRON 61 08/14/2009   Attestation Statements:   Reviewed by clinician on day of visit: allergies, medications, problem list, medical history, surgical history, family history, social history, and previous encounter notes.   I, Trixie Dredge, am acting as transcriptionist for Coralie Common, MD.  I have reviewed the above documentation for accuracy and completeness, and I agree with the above. - Jinny Blossom, MD

## 2020-06-29 ENCOUNTER — Ambulatory Visit (INDEPENDENT_AMBULATORY_CARE_PROVIDER_SITE_OTHER): Payer: Managed Care, Other (non HMO) | Admitting: Family Medicine

## 2020-06-29 ENCOUNTER — Encounter (INDEPENDENT_AMBULATORY_CARE_PROVIDER_SITE_OTHER): Payer: Self-pay

## 2020-08-17 ENCOUNTER — Other Ambulatory Visit: Payer: Self-pay | Admitting: Family Medicine

## 2020-08-17 DIAGNOSIS — E118 Type 2 diabetes mellitus with unspecified complications: Secondary | ICD-10-CM

## 2020-08-17 DIAGNOSIS — E1159 Type 2 diabetes mellitus with other circulatory complications: Secondary | ICD-10-CM

## 2020-08-29 ENCOUNTER — Other Ambulatory Visit: Payer: Self-pay

## 2020-08-29 ENCOUNTER — Ambulatory Visit (INDEPENDENT_AMBULATORY_CARE_PROVIDER_SITE_OTHER): Payer: Managed Care, Other (non HMO) | Admitting: Family Medicine

## 2020-08-29 ENCOUNTER — Encounter: Payer: Self-pay | Admitting: Family Medicine

## 2020-08-29 VITALS — BP 122/80 | HR 88 | Temp 96.7°F | Wt 320.0 lb

## 2020-08-29 DIAGNOSIS — Z23 Encounter for immunization: Secondary | ICD-10-CM

## 2020-08-29 DIAGNOSIS — M542 Cervicalgia: Secondary | ICD-10-CM | POA: Diagnosis not present

## 2020-08-29 NOTE — Progress Notes (Signed)
   Subjective:    Patient ID: Vincent Cantu, male    DOB: September 10, 1971, 49 y.o.   MRN: [id number]  HPI He initially came in because of left shoulder pain however further history indicates that he has been having an 9-month history of neck pain that he has been seeing a chiropractor for with little improvement.  Over the last 3 months he has noted left shoulder pain that is constant and not made worse with any particular motion and in fact motion seems to help the slightly.  No numbness, tingling or weakness.  He states that he is did note one time radiation down into his fingers not sure which fingers.   Review of Systems     Objective:   Physical Exam Alert and in no distress.  Leftward rotation of the neck did cause some neck discomfort but negative Spurling test.  Normal motor, sensory and DTRs.       Assessment & Plan:  Neck pain - Plan: DG Cervical Spine Complete  Need for influenza vaccination - Plan: Flu Vaccine QUAD 36+ mos IM I will do x-rays first and anticipate seeing arthritic changes.  Will probably then refer to physical therapy.  He is also to keep track of anything that makes the symptoms worse or better.

## 2020-10-24 ENCOUNTER — Encounter: Payer: Self-pay | Admitting: Family Medicine

## 2020-10-24 ENCOUNTER — Other Ambulatory Visit: Payer: Self-pay

## 2020-10-24 ENCOUNTER — Ambulatory Visit (INDEPENDENT_AMBULATORY_CARE_PROVIDER_SITE_OTHER): Payer: Managed Care, Other (non HMO) | Admitting: Family Medicine

## 2020-10-24 VITALS — BP 120/72 | HR 75 | Temp 98.2°F | Wt 324.0 lb

## 2020-10-24 DIAGNOSIS — E1169 Type 2 diabetes mellitus with other specified complication: Secondary | ICD-10-CM | POA: Diagnosis not present

## 2020-10-24 DIAGNOSIS — E118 Type 2 diabetes mellitus with unspecified complications: Secondary | ICD-10-CM | POA: Diagnosis not present

## 2020-10-24 DIAGNOSIS — I152 Hypertension secondary to endocrine disorders: Secondary | ICD-10-CM

## 2020-10-24 DIAGNOSIS — E785 Hyperlipidemia, unspecified: Secondary | ICD-10-CM

## 2020-10-24 DIAGNOSIS — Z23 Encounter for immunization: Secondary | ICD-10-CM

## 2020-10-24 DIAGNOSIS — E1159 Type 2 diabetes mellitus with other circulatory complications: Secondary | ICD-10-CM | POA: Diagnosis not present

## 2020-10-24 DIAGNOSIS — Z6841 Body Mass Index (BMI) 40.0 and over, adult: Secondary | ICD-10-CM

## 2020-10-24 LAB — POCT GLYCOSYLATED HEMOGLOBIN (HGB A1C): Hemoglobin A1C: 7 % — AB (ref 4.0–5.6)

## 2020-10-24 MED ORDER — TRULICITY 0.75 MG/0.5ML ~~LOC~~ SOAJ: 0.7500 mg | SUBCUTANEOUS | 12 refills | Status: DC

## 2020-10-24 NOTE — Progress Notes (Signed)
  Subjective:    Patient ID: Vincent Cantu, male    DOB: [birth year], 49 y.o.   MRN: [id number]  Vincent Cantu is a 49 y.o. male who presents for follow-up of Type 2 diabetes mellitus.  Patient is checking home blood sugars.   Home blood sugar records: BGs consistently in an acceptable range around 120 How often is blood sugars being checked: every morning Current symptoms/problems include none and have been stable. Daily foot checks: yes   Any foot concerns: no concerns Last eye exam: 1/20 Exercise: Home exercise routine includes walking 1 hr 10 minutes daily hrs per days. He continues on Metformin but did not start taking Rybelsus due to cost.  He has been going to medical weight loss however has stopped going due to cost constraints.  He continues on metoprolol, terazosin, Edarbychlor as well as amlodipine for his blood pressure.  Atorvastatin is causing no difficulties. The following portions of the patient's history were reviewed and updated as appropriate: allergies, current medications, past medical history, past social history and problem list.  ROS as in subjective above.     Objective:    Physical Exam Alert and in no distress otherwise not examined.  Hemoglobin A1c is 7.0 Lab Review Diabetic Labs Latest Ref Rng & Units 03/23/2020 10/20/2019 01/21/2019 09/22/2018 04/15/2018  HbA1c 4.8 - 5.6 % 6.6(H) 6.7(A) 7.2(A) 7.4(A) 6.8(A)  Microalbumin mg/L - 7.5 - 25.5 -  Micro/Creat Ratio - - 6.2 - 14.7 -  Chol 100 - 199 mg/dL 131 125 - 138 -  HDL >39 mg/dL 29(L) 28(L) - 29(L) -  Calc LDL 0 - 99 mg/dL 82 77 - 85 -  Triglycerides 0 - 149 mg/dL 104 105 - 120 -  Creatinine 0.76 - 1.27 mg/dL 1.52(H) 1.33(H) - 1.28(H) -  GFR >60.00 mL/min - - - - -   BP/Weight 08/29/2020 06/06/2020 05/23/2020 05/20/7105 12/25/9483  Systolic BP 462 703 500 938 182  Diastolic BP 80 80 74 82 82  Wt. (Lbs) 320 316 316 323.8 313  BMI 41.09 40.57 40.57 41.57 40.19   Foot/eye exam completion dates Latest Ref Rng &  Units 12/15/2019 10/20/2019  Eye Exam No Retinopathy No Retinopathy -  Foot Form Completion - - Done    [name]  reports that he has never smoked. He has never used smokeless tobacco. He reports that he does not drink alcohol and does not use drugs.     Assessment & Plan:    Type 2 diabetes with complication (HCC) - Plan: Dulaglutide (TRULICITY) 9.93 ZJ/6.9CV SOPN, HgB A1c  Class 3 severe obesity with serious comorbidity and body mass index (BMI) of 40.0 to 44.9 in adult, unspecified obesity type ([place])  Hyperlipidemia associated with type 2 diabetes mellitus ([place])  Hypertension associated with diabetes ([place])  Morbid obesity ([place])  Immunization, viral disease - Plan: Pfizer SARS-COV-2 Vaccine    1. Rx changes: I will add Trulicity to his regimen. 2. Education: Reviewed 'ABCs' of diabetes management (respective goals in parentheses):  A1C (<7), blood pressure (<130/80), and cholesterol (LDL <100). 3. Compliance at present is estimated to be good. Efforts to improve compliance (if necessary) will be directed at increased exercise. 4. Follow up: 4 months  Discussed the use of Trulicity and cost of medication.  Discussed information given.  He will let me know if there is a problem with that.  Discussed the benefits in regard to diabetes, heart disease, weight loss.

## 2020-11-21 ENCOUNTER — Other Ambulatory Visit: Payer: Self-pay | Admitting: Family Medicine

## 2020-11-21 DIAGNOSIS — E118 Type 2 diabetes mellitus with unspecified complications: Secondary | ICD-10-CM

## 2020-11-21 NOTE — Telephone Encounter (Signed)
Med is not covered by pt insurance. Please consider another med listed or sending to [facility] for [facility]. KH

## 2020-11-23 ENCOUNTER — Other Ambulatory Visit: Payer: Self-pay | Admitting: Family Medicine

## 2020-11-23 DIAGNOSIS — E1159 Type 2 diabetes mellitus with other circulatory complications: Secondary | ICD-10-CM

## 2020-12-01 ENCOUNTER — Other Ambulatory Visit: Payer: Self-pay

## 2020-12-01 ENCOUNTER — Encounter: Payer: Self-pay | Admitting: Medical

## 2020-12-01 ENCOUNTER — Ambulatory Visit (INDEPENDENT_AMBULATORY_CARE_PROVIDER_SITE_OTHER): Payer: 59 | Admitting: Medical

## 2020-12-01 ENCOUNTER — Ambulatory Visit
Admission: RE | Admit: 2020-12-01 | Discharge: 2020-12-01 | Disposition: A | Discharge status: Home / Self Care | Payer: 59 | Source: Ambulatory Visit | Attending: Medical | Admitting: Medical

## 2020-12-01 VITALS — BP 140/84 | HR 92 | Wt 325.4 lb

## 2020-12-01 DIAGNOSIS — G8929 Other chronic pain: Secondary | ICD-10-CM

## 2020-12-01 DIAGNOSIS — M25562 Pain in left knee: Secondary | ICD-10-CM

## 2020-12-01 DIAGNOSIS — M79605 Pain in left leg: Secondary | ICD-10-CM

## 2020-12-01 HISTORY — DX: Other chronic pain: G89.29

## 2020-12-01 NOTE — Patient Instructions (Signed)
Please go to [facility] for your left knee xray.   Their hours are 8am - 4:30 pm Monday - Friday.  Take your insurance card with you.  [facility] [phone]  [address], [place], [place] [address]  [address] [place], [place] [address]

## 2020-12-01 NOTE — Progress Notes (Signed)
Subjective:  Vincent Cantu is a 50 y.o. male who presents for Chief Complaint  Patient presents with  . Knee Pain    Started 6 months ago- do a lot of walking     Been having knee pains on left, x 6 months on and off. But in last few days worse pain, pain down back of left calf.  No swelling.  No current calve pain.  No fall or injury.  Does a lot of walking though.  Using some OTC ibuprofen.  Does get some left lateral leg pain and does get some pains with stairs. No other aggravating or relieving factors.    No other c/o.  The following portions of the patient's history were reviewed and updated as appropriate: allergies, current medications, past family history, past medical history, past social history, past surgical history and problem list.  ROS Otherwise as in subjective above  Past Medical History:  Diagnosis Date  . Back pain   . Chest pain   . Diabetes mellitus without complication ([place]) 12/8411  . History of prediabetes   . Hyperlipidemia   . Hypertension    Past Surgical History:  Procedure Laterality Date  . arthroscopic to knee for meniscus tear    . LUMBAR FUSION    . LUMBAR LAMINECTOMY    . umbilical hernia repair with mesh x 2    . VENTRAL HERNIA REPAIR N/A 08/28/2014   Procedure: HERNIA REPAIR VENTRAL ADULT;  Surgeon: Doreen Salvage, MD;  Location: [facility];  Service: General;  Laterality: N/A;     Objective: BP 140/84   Pulse 92   Wt (!) 325 lb 6.4 oz (147.6 kg)   SpO2 98%   BMI 41.78 kg/m   General appearance: alert, no distress, well developed, well nourished MSK: Left knee with slight tenderness posterior medial over the biceps femoral tendons, tender over the left lateral knee lateral tibial plateau, otherwise no swelling no laxity, no obvious tenderness otherwise no deformity, normal range of motion, negative McMurray's, otherwise leg nontender normal range of motion Pulses: 2+ radial pulses, 2+ pedal pulses, normal cap refill Ext: no edema Legs  neurovascularly intact     Assessment: Encounter Diagnoses  Name Primary?  . Chronic pain of left knee Yes  . Pain in inferior left lower extremity      Plan: We discussed his symptoms and findings.  I suspect some strain of the knee and some patellofemoral tracking issue possibly.  There could be some arthritis.  No obvious torn ligament or meniscus issue.  We will send for x-ray.  If x-ray normal we will refer to physical therapy  In the short-term advised no deep knee bends, no heavy lifting the next few days, can use over-the-counter Tylenol or alternate Tylenol with ibuprofen short-term.  We discussed caution with NSAIDs given his underlying health issues to protect kidney  Can use ice, bag of frozen peas as needed 20 minutes on 20 soft  [place] was seen today for knee pain.  Diagnoses and all orders for this visit:  Chronic pain of left knee -     DG Knee Complete 4 Views Left; Future  Pain in inferior left lower extremity -     DG Knee Complete 4 Views Left; Future    Follow up: Pending x-ray

## 2020-12-19 ENCOUNTER — Telehealth: Payer: Self-pay | Admitting: Medical

## 2020-12-19 ENCOUNTER — Other Ambulatory Visit: Payer: Self-pay

## 2020-12-19 DIAGNOSIS — G8929 Other chronic pain: Secondary | ICD-10-CM

## 2020-12-19 DIAGNOSIS — M79605 Pain in left leg: Secondary | ICD-10-CM

## 2020-12-19 NOTE — Telephone Encounter (Signed)
I had previously requested referral to therapy when I sent xray results to you.  So not sure if you already referred, but check on this

## 2020-12-19 NOTE — Telephone Encounter (Signed)
Referral has been sent to [facility]

## 2020-12-21 ENCOUNTER — Other Ambulatory Visit: Payer: Self-pay | Admitting: Family Medicine

## 2020-12-21 DIAGNOSIS — E782 Mixed hyperlipidemia: Secondary | ICD-10-CM

## 2020-12-21 DIAGNOSIS — E1169 Type 2 diabetes mellitus with other specified complication: Secondary | ICD-10-CM

## 2021-01-02 LAB — HM DIABETES EYE EXAM

## 2021-02-06 ENCOUNTER — Encounter: Payer: Self-pay | Admitting: Family Medicine

## 2021-02-21 ENCOUNTER — Other Ambulatory Visit: Payer: Self-pay | Admitting: Family Medicine

## 2021-02-21 DIAGNOSIS — E1159 Type 2 diabetes mellitus with other circulatory complications: Secondary | ICD-10-CM

## 2021-04-07 ENCOUNTER — Other Ambulatory Visit: Payer: Self-pay | Admitting: Family Medicine

## 2021-04-07 DIAGNOSIS — E118 Type 2 diabetes mellitus with unspecified complications: Secondary | ICD-10-CM

## 2021-05-03 ENCOUNTER — Other Ambulatory Visit: Payer: Self-pay | Admitting: Family Medicine

## 2021-05-03 ENCOUNTER — Other Ambulatory Visit: Payer: Self-pay

## 2021-05-03 ENCOUNTER — Ambulatory Visit (INDEPENDENT_AMBULATORY_CARE_PROVIDER_SITE_OTHER): Payer: 59 | Admitting: Family Medicine

## 2021-05-03 VITALS — BP 128/86 | HR 108 | Temp 98.1°F | Wt 328.6 lb

## 2021-05-03 DIAGNOSIS — R0781 Pleurodynia: Secondary | ICD-10-CM | POA: Diagnosis not present

## 2021-05-03 DIAGNOSIS — E118 Type 2 diabetes mellitus with unspecified complications: Secondary | ICD-10-CM | POA: Diagnosis not present

## 2021-05-03 MED ORDER — TRULICITY 0.75 MG/0.5ML ~~LOC~~ SOAJ
0.7500 mg | SUBCUTANEOUS | 12 refills | Status: DC
Start: 2021-05-03 — End: 2021-05-16

## 2021-05-03 NOTE — Progress Notes (Signed)
   Subjective:    Patient ID: Vincent Cantu, male    DOB: June 08, 1971, 50 y.o.   MRN: [id number]  HPI He complains of intermittent left sided rib pain is slightly musculoskeletal in nature.  Breathing has no effect.  He is no coughing, shortness of breath, fever, chills, nausea or vomiting. He also would like a refill on Trulicity.  Apparently he had difficulty getting the medication and is willing developed for a couple weeks.  Review of Systems     Objective:   Physical Exam Alert and in no distress.  Lungs are clear to auscultation.  No palpable chest wall tenderness.  Minimal discomfort with rotational motion of the ribs.  No tenderness palpation of the abdomen.       Assessment & Plan:  Rib pain on left side  Type 2 diabetes with complication (HCC) - Plan: Dulaglutide (TRULICITY) 8.91 QX/4.5WT SOPN I explained that at this time there is no major issue going on with the ribs other than probably musculoskeletal.  Recommended supportive care but no intervention at this time.  If this continues, he is to return here for further evaluation.  He was comfortable with that.  He is scheduled to come back in August for diabetes recheck.

## 2021-05-07 ENCOUNTER — Encounter: Payer: Managed Care, Other (non HMO) | Admitting: Family Medicine

## 2021-05-12 ENCOUNTER — Telehealth: Payer: Self-pay

## 2021-05-12 NOTE — Telephone Encounter (Signed)
Pharmacy is asking for alternative for Trulicity, insurance no longer covering.  This patient has had issues in past with insulin being very expensive with his insurance.  Options are to switch to Bydureon or can we try the new medication Mounjaro which has discount card & is supposed to only be $25 a month if this is a option for this patient?

## 2021-05-15 ENCOUNTER — Other Ambulatory Visit: Payer: Self-pay | Admitting: Family Medicine

## 2021-05-15 DIAGNOSIS — I152 Hypertension secondary to endocrine disorders: Secondary | ICD-10-CM

## 2021-05-15 DIAGNOSIS — E1159 Type 2 diabetes mellitus with other circulatory complications: Secondary | ICD-10-CM

## 2021-05-16 ENCOUNTER — Telehealth: Payer: Self-pay

## 2021-05-16 MED ORDER — TIRZEPATIDE 5 MG/0.5ML ~~LOC~~ SOAJ: 5.0000 mg | SUBCUTANEOUS | 2 refills | Status: DC

## 2021-05-16 NOTE — Telephone Encounter (Signed)
Changed pt to [facility] per Dr. Redmond School.  Activated discount card and faxed to pharmacy & went thru for $0 co pay.  We have sample for first month starter dose 2.5 once a week for month and then 5mg  weekly for the next month & that dose will be at the pharmacy tomorrow.  Pt informed

## 2021-05-16 NOTE — Telephone Encounter (Signed)
See next telephone call, pt switched and went thru for $0

## 2021-05-31 IMAGING — CR DG KNEE COMPLETE 4+V*L*
4 series · 4 of 4 positions shown · non-contrast
Comparison: None.

CLINICAL DATA: Chronic left knee pain.

EXAM:
LEFT KNEE - COMPLETE 4+ VIEW

[t knee ap left]
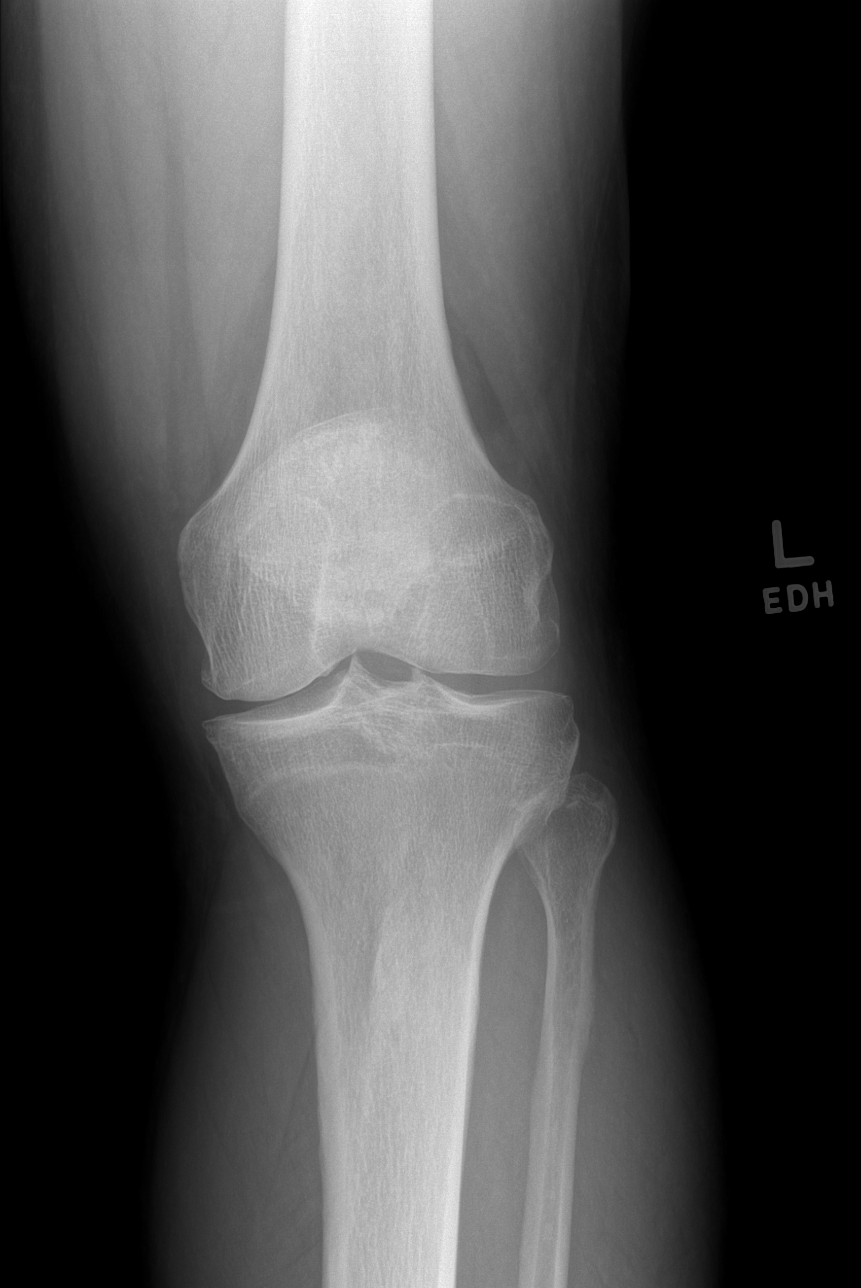

[t knee oblique left (1 of 2)]
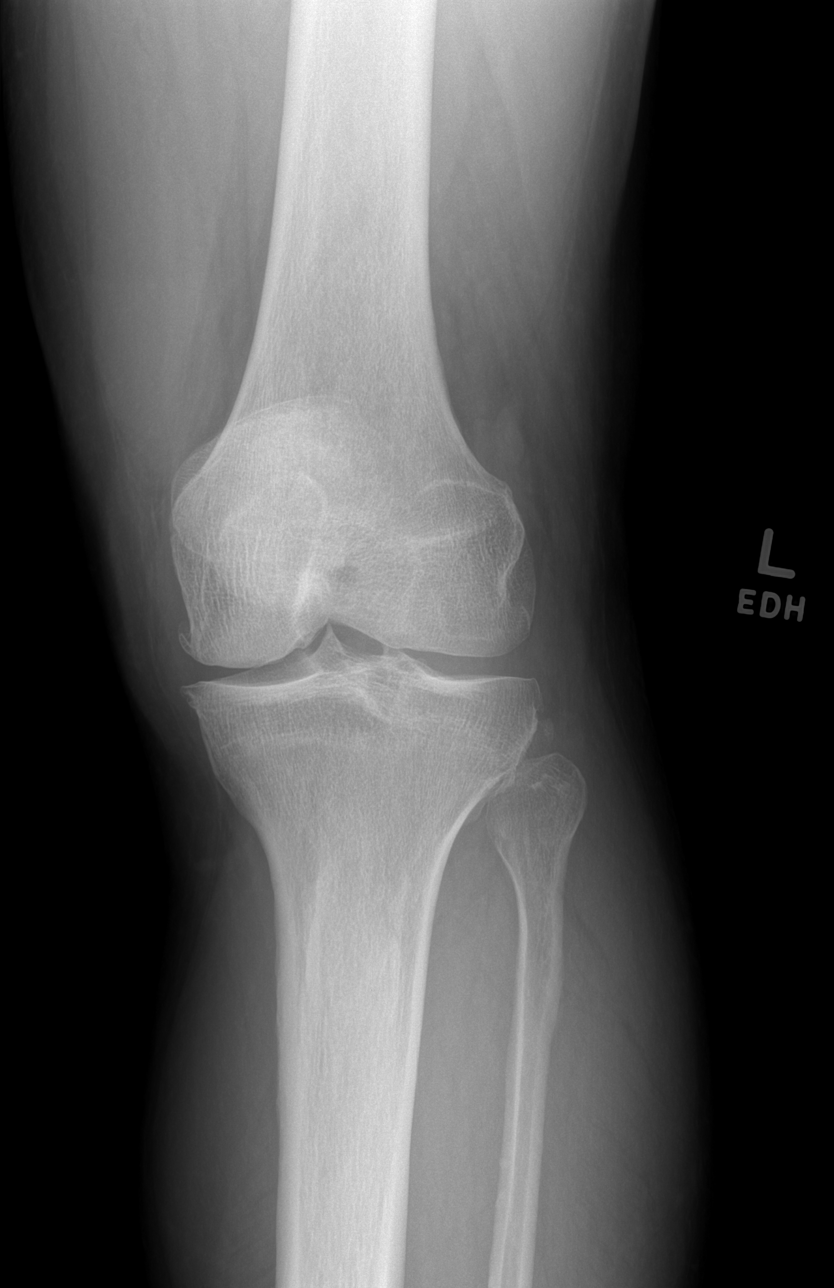

[t knee oblique left (2 of 2)]
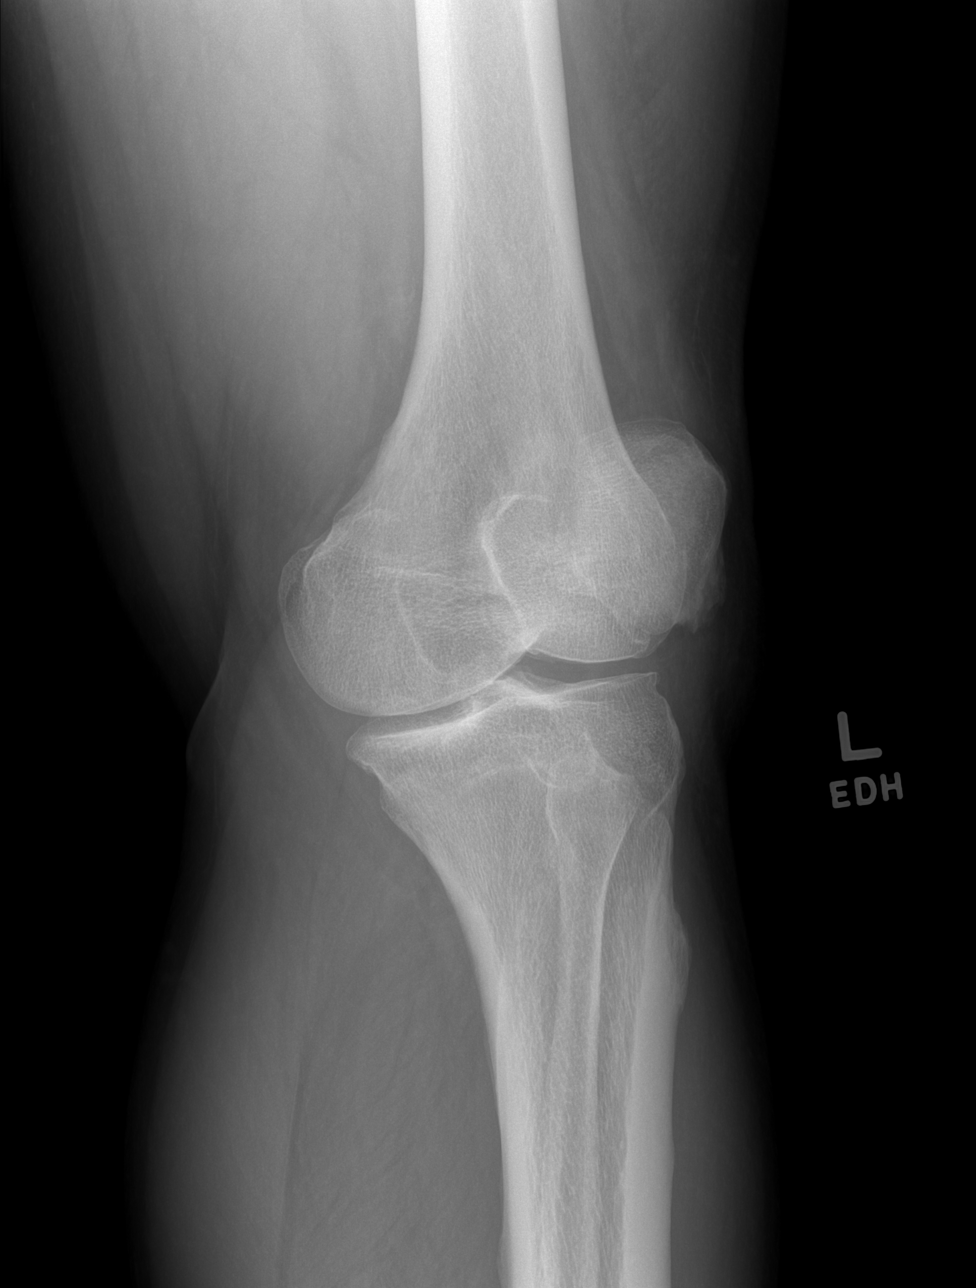

[t knee lat left]
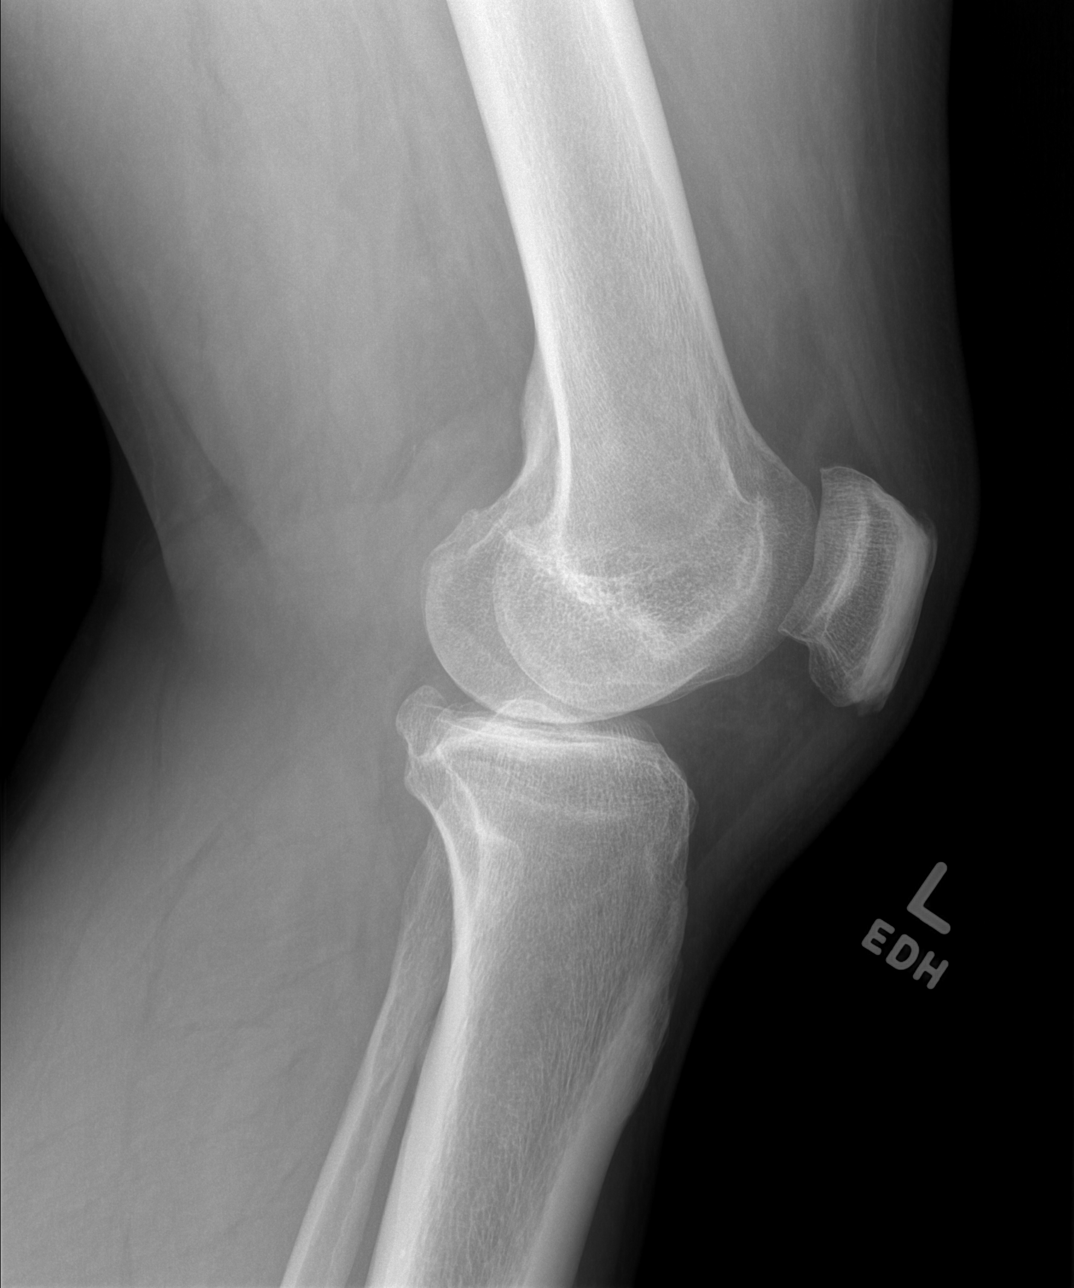

[4 of 4 positions shown; findings below may reference images not displayed]

FINDINGS: No fracture, dislocation, or knee joint effusion is identified. Mild
medial compartment joint space narrowing and marginal osteophytosis
are present. Lateral compartment joint space width is preserved.
There is minimal patellar spurring. The soft tissues are
unremarkable.
IMPRESSION: Mild left knee osteoarthrosis.

## 2021-06-19 ENCOUNTER — Ambulatory Visit (INDEPENDENT_AMBULATORY_CARE_PROVIDER_SITE_OTHER): Payer: 59 | Admitting: Family Medicine

## 2021-06-19 ENCOUNTER — Encounter: Payer: Self-pay | Admitting: Family Medicine

## 2021-06-19 VITALS — BP 122/88 | HR 93 | Temp 96.8°F | Ht 74.5 in | Wt 321.0 lb

## 2021-06-19 DIAGNOSIS — Z0000 Encounter for general adult medical examination without abnormal findings: Secondary | ICD-10-CM

## 2021-06-19 DIAGNOSIS — E118 Type 2 diabetes mellitus with unspecified complications: Secondary | ICD-10-CM | POA: Diagnosis not present

## 2021-06-19 DIAGNOSIS — E1169 Type 2 diabetes mellitus with other specified complication: Secondary | ICD-10-CM | POA: Diagnosis not present

## 2021-06-19 DIAGNOSIS — G5603 Carpal tunnel syndrome, bilateral upper limbs: Secondary | ICD-10-CM

## 2021-06-19 DIAGNOSIS — Z8616 Personal history of COVID-19: Secondary | ICD-10-CM

## 2021-06-19 DIAGNOSIS — E785 Hyperlipidemia, unspecified: Secondary | ICD-10-CM

## 2021-06-19 DIAGNOSIS — E1159 Type 2 diabetes mellitus with other circulatory complications: Secondary | ICD-10-CM

## 2021-06-19 DIAGNOSIS — M549 Dorsalgia, unspecified: Secondary | ICD-10-CM

## 2021-06-19 DIAGNOSIS — D126 Benign neoplasm of colon, unspecified: Secondary | ICD-10-CM

## 2021-06-19 DIAGNOSIS — Z1159 Encounter for screening for other viral diseases: Secondary | ICD-10-CM

## 2021-06-19 DIAGNOSIS — K439 Ventral hernia without obstruction or gangrene: Secondary | ICD-10-CM

## 2021-06-19 DIAGNOSIS — I152 Hypertension secondary to endocrine disorders: Secondary | ICD-10-CM

## 2021-06-19 HISTORY — DX: Personal history of COVID-19: Z86.16

## 2021-06-19 LAB — POCT UA - MICROALBUMIN
Albumin/Creatinine Ratio, Urine, POC: 5.5
Creatinine, POC: 446.2 mg/dL
Microalbumin Ur, POC: 24.5 mg/L

## 2021-06-19 LAB — POCT GLYCOSYLATED HEMOGLOBIN (HGB A1C): Hemoglobin A1C: 6.7 % — AB (ref 4.0–5.6)

## 2021-06-19 NOTE — Patient Instructions (Addendum)
Use forearm splints at work and potentially using night splints for nighttime problems with your carpal tunnel syndrome  For your rib and shoulder discomfort, the sequences successes proper posturing stretching, range of motion,

## 2021-06-19 NOTE — Progress Notes (Signed)
.    Subjective:    Patient ID: Vincent Cantu, male    DOB: Jun 09, 1971, 50 y.o.   MRN: [id number]  HPI He is here for complete examination.  He does mention continued difficulty with left and right sided rib and back pain he tends to notice this mainly when he works.  No history of injury or overuse.  He has not used any medications to try and help this.  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  He also notes thumb and index finger tingling sensation when he uses his computer but when he straightens his arms out the symptoms go away relatively quickly.  He also has a ventral hernia that he has had for quite some time.  It is quite large but is not causing any pain.  He does have underlying diabetes and states that he walks daily and does check his CBGs fairly regularly.  He is on metformin and Monjaro for his diabetes.  He is also taking atorvastatin and having no difficulty with that.  Continues on Norvasc, Edarbychlor, metoprolol and Hytrin.  He does have a history of colonic polyps review of the system says he is in need of another colonoscopy. Otherwise his family and social history as well as health maintenance and immunizations was reviewed  Review of Systems  All other systems reviewed and are negative.     Objective:   Physical Exam Alert and in no distress. Tympanic membranes and canals are normal. Pharyngeal area is normal. Neck is supple without adenopathy or thyromegaly. Cardiac exam shows a regular sinus rhythm without murmurs or gallops. Lungs are clear to auscultation. Abdominal exam shows a 6 cm round smooth nontender lesion that was partially decompressed.  Diabetic foot exam is normal.  Hemoglobin A1c is 6.7       Assessment & Plan:  Routine general medical examination at a health care facility - Plan: CBC with Differential/Platelet, Comprehensive metabolic panel, POCT glycosylated hemoglobin (Hb A1C)  Ventral hernia without obstruction or  gangrene - Plan: Ambulatory referral to General Surgery  Hyperlipidemia associated with type 2 diabetes mellitus ([place])  Hypertension associated with diabetes ([place]) - Plan: CBC with Differential/Platelet, Comprehensive metabolic panel  Morbid obesity ([place]) - Plan: CBC with Differential/Platelet, Comprehensive metabolic panel, Lipid panel  Type 2 diabetes with complication ([place]) - Plan: CBC with Differential/Platelet, Comprehensive metabolic panel, POCT glycosylated hemoglobin (Hb A1C), Lipid panel, POCT UA - Microalbumin  Adenomatous polyp of colon, unspecified part of colon - Plan: Ambulatory referral to Gastroenterology  Musculoskeletal back pain  History of COVID-19 - June 2022  Bilateral carpal tunnel syndrome  Need for hepatitis C screening test - Plan: Hepatitis C antibody I again stressed the need for him to make further diet and exercise changes since his weight is essentially unchanged.  His blood pressure seems to be under good control.  He will continue on his present medication regimen.  Referral will be made to general surgery as well as for colonoscopy. Discussed the treatment of his back and carpal tunnel.  Strongly encouraged him to use proper posturing while sitting at his desk and demonstrated it to him.  He is also to use his breaks to stretch adequately throughout the day.  Discussed CTS and the fact that he should use a forearm rest as well as possibly using wrist splints at night.

## 2021-06-20 LAB — COMPREHENSIVE METABOLIC PANEL
ALT: 43 IU/L (ref 0–44)
AST: 50 IU/L — ABNORMAL HIGH (ref 0–40)
Albumin/Globulin Ratio: 1.6 (ref 1.2–2.2)
Albumin: 4.9 g/dL (ref 3.8–4.9)
Alkaline Phosphatase: 91 IU/L (ref 44–121)
BUN/Creatinine Ratio: 9 (ref 9–20)
BUN: 13 mg/dL (ref 6–24)
Bilirubin Total: 0.7 mg/dL (ref 0.0–1.2)
CO2: 22 mmol/L (ref 20–29)
Calcium: 9.8 mg/dL (ref 8.7–10.2)
Chloride: 102 mmol/L (ref 96–106)
Creatinine, Ser: 1.4 mg/dL — ABNORMAL HIGH (ref 0.76–1.27)
Globulin, Total: 3 g/dL (ref 1.5–4.5)
Glucose: 96 mg/dL (ref 65–99)
Potassium: 4 mmol/L (ref 3.5–5.2)
Sodium: 141 mmol/L (ref 134–144)
Total Protein: 7.9 g/dL (ref 6.0–8.5)
eGFR: 60 mL/min/{1.73_m2} (ref >59)

## 2021-06-20 LAB — CBC WITH DIFFERENTIAL/PLATELET
Basophils Absolute: 0.1 10*3/uL (ref 0.0–0.2)
Basos: 1 %
EOS (ABSOLUTE): 0.1 10*3/uL (ref 0.0–0.4)
Eos: 2 %
Hematocrit: 41.6 % (ref 37.5–51.0)
Hemoglobin: 12.9 g/dL — ABNORMAL LOW (ref 13.0–17.7)
Immature Grans (Abs): 0 10*3/uL (ref 0.0–0.1)
Immature Granulocytes: 0 %
Lymphocytes Absolute: 3.2 10*3/uL — ABNORMAL HIGH (ref 0.7–3.1)
Lymphs: 46 %
MCH: 25.2 pg — ABNORMAL LOW (ref 26.6–33.0)
MCHC: 31 g/dL — ABNORMAL LOW (ref 31.5–35.7)
MCV: 81 fL (ref 79–97)
Monocytes Absolute: 0.6 10*3/uL (ref 0.1–0.9)
Monocytes: 9 %
Neutrophils Absolute: 2.8 10*3/uL (ref 1.4–7.0)
Neutrophils: 42 %
Platelets: 240 10*3/uL (ref 150–450)
RBC: 5.11 x10E6/uL (ref 4.14–5.80)
RDW: 13.7 % (ref 11.6–15.4)
WBC: 6.8 10*3/uL (ref 3.4–10.8)

## 2021-06-20 LAB — LIPID PANEL
Chol/HDL Ratio: 4.2 ratio (ref 0.0–5.0)
Cholesterol, Total: 130 mg/dL (ref 100–199)
HDL: 31 mg/dL — ABNORMAL LOW (ref >39)
LDL Chol Calc (NIH): 79 mg/dL (ref 0–99)
Triglycerides: 110 mg/dL (ref 0–149)
VLDL Cholesterol Cal: 20 mg/dL (ref 5–40)

## 2021-06-20 LAB — HEPATITIS C ANTIBODY: Hep C Virus Ab: <0.1 s/co ratio (ref 0.0–0.9)

## 2021-07-18 ENCOUNTER — Telehealth: Payer: Self-pay

## 2021-07-18 NOTE — Telephone Encounter (Signed)
P.A. MOUNJARO 

## 2021-07-19 LAB — HM COLONOSCOPY

## 2021-07-20 ENCOUNTER — Encounter: Payer: Self-pay | Admitting: Family Medicine

## 2021-07-21 NOTE — Telephone Encounter (Signed)
P.A. approved, pt and pharmacy informed, went thru with discount card for $25

## 2021-08-03 ENCOUNTER — Other Ambulatory Visit: Payer: Self-pay | Admitting: Surgery

## 2021-09-05 ENCOUNTER — Other Ambulatory Visit: Payer: Self-pay | Admitting: Family Medicine

## 2021-09-18 NOTE — Pre-Procedure Instructions (Addendum)
Surgical Instructions    Your procedure is scheduled on Wednesday 09/26/21.   Report to [facility] Main Entrance "A" at 08:00 A.M., then check in with the Admitting office.  Call this number if you have problems the morning of surgery:  [phone]   If you have any questions prior to your surgery date call [phone]: Open Monday-Friday 8am-4pm    Remember:  Do not eat after midnight the night before your surgery  You may drink clear liquids until 07:00 A.M. the morning of your surgery.   Clear liquids allowed are: Water, Non-Citrus Juices (without pulp), Carbonated Beverages, Clear Tea, Black Coffee ONLY (NO MILK, CREAM OR POWDERED CREAMER of any kind), and Gatorade    Take these medicines the morning of surgery with A SIP OF WATER   amLODipine (NORVASC)   atorvastatin (LIPITOR)  metoprolol (TOPROL-XL)   As of today, STOP taking any Aspirin (unless otherwise instructed by your surgeon) Aleve, Naproxen, Ibuprofen, Motrin, Advil, Goody's, BC's, all herbal medications, fish oil, and all vitamins.   WHAT DO I DO ABOUT MY DIABETES MEDICATION?   Do not take oral diabetes medicines (pills) the morning of surgery. DO NOT TAKE metFORMIN (GLUCOPHAGE) the morning of surgery.  The day of surgery, do not take other diabetes injectables, including Byetta (exenatide), Bydureon (exenatide ER), Victoza (liraglutide), or Trulicity (dulaglutide).  HOW TO MANAGE YOUR DIABETES BEFORE AND AFTER SURGERY  Why is it important to control my blood sugar before and after surgery? Improving blood sugar levels before and after surgery helps healing and can limit problems. A way of improving blood sugar control is eating a healthy diet by:  Eating less sugar and carbohydrates  Increasing activity/exercise  Talking with your doctor about reaching your blood sugar goals High blood sugars (greater than 180 mg/dL) can raise your risk of infections and slow your recovery, so you will need to focus on  controlling your diabetes during the weeks before surgery. Make sure that the doctor who takes care of your diabetes knows about your planned surgery including the date and location.  How do I manage my blood sugar before surgery? Check your blood sugar at least 4 times a day, starting 2 days before surgery, to make sure that the level is not too high or low.  Check your blood sugar the morning of your surgery when you wake up and every 2 hours until you get to the Short Stay unit.  If your blood sugar is less than 70 mg/dL, you will need to treat for low blood sugar: Do not take insulin. Treat a low blood sugar (less than 70 mg/dL) with  cup of clear juice (cranberry or apple), 4 glucose tablets, OR glucose gel. Recheck blood sugar in 15 minutes after treatment (to make sure it is greater than 70 mg/dL). If your blood sugar is not greater than 70 mg/dL on recheck, call [phone] for further instructions. Report your blood sugar to the short stay nurse when you get to Short Stay.  If you are admitted to the hospital after surgery: Your blood sugar will be checked by the staff and you will probably be given insulin after surgery (instead of oral diabetes medicines) to make sure you have good blood sugar levels. The goal for blood sugar control after surgery is 80-180 mg/dL.   After your COVID test   You are not required to quarantine however you are required to wear a well-fitting mask when you are out and around people not in your  household.  If your mask becomes wet or soiled, replace with a new one.  Wash your hands often with soap and water for 20 seconds or clean your hands with an alcohol-based hand sanitizer that contains at least 60% alcohol.  Do not share personal items.  Notify your provider: if you are in close contact with someone who has COVID  or if you develop a fever of 100.4 or greater, sneezing, cough, sore throat, shortness of breath or body aches.              Do not wear jewelry or makeup Do not wear lotions, powders, perfumes/colognes, or deodorant. Do not shave 48 hours prior to surgery.  Men may shave face and neck. Do not bring valuables to the hospital. DO Not wear nail polish, gel polish, artificial nails, or any other type of covering on natural nails including finger and toenails. If patients have artificial nails, gel coating, etc. that need to be removed by a nail salon, please have this removed prior to surgery or surgery may need to be canceled/delayed if the surgeon/ anesthesia feels like the patient is unable to be adequately monitored.             [place] is not responsible for any belongings or valuables.  Do NOT Smoke (Tobacco/Vaping)  24 hours prior to your procedure  If you use a CPAP at night, you may bring your mask for your overnight stay.   Contacts, glasses, hearing aids, dentures or partials may not be worn into surgery, please bring cases for these belongings   For patients admitted to the hospital, discharge time will be determined by your treatment team.   Patients discharged the day of surgery will not be allowed to drive home, and someone needs to stay with them for 24 hours.  NO VISITORS WILL BE ALLOWED IN PRE-OP WHERE PATIENTS ARE PREPPED FOR SURGERY.  ONLY 1 SUPPORT PERSON MAY BE PRESENT IN THE WAITING ROOM WHILE YOU ARE IN SURGERY.  IF YOU ARE TO BE ADMITTED, ONCE YOU ARE IN YOUR ROOM YOU WILL BE ALLOWED TWO (2) VISITORS. 1 (ONE) VISITOR MAY STAY OVERNIGHT BUT MUST ARRIVE TO THE ROOM BY 8pm.  Minor children may have two parents present. Special consideration for safety and communication needs will be reviewed on a case by case basis.  Special instructions:    Oral Hygiene is also important to reduce your risk of infection.  Remember - BRUSH YOUR TEETH THE MORNING OF SURGERY WITH YOUR REGULAR TOOTHPASTE   [facility]- Preparing For Surgery  Before surgery, you can play an important role. Because skin is  not sterile, your skin needs to be as free of germs as possible. You can reduce the number of germs on your skin by washing with CHG (chlorahexidine gluconate) Soap before surgery.  CHG is an antiseptic cleaner which kills germs and bonds with the skin to continue killing germs even after washing.     Please do not use if you have an allergy to CHG or antibacterial soaps. If your skin becomes reddened/irritated stop using the CHG.  Do not shave (including legs and underarms) for at least 48 hours prior to first CHG shower. It is OK to shave your face.  Please follow these instructions carefully.     Shower the NIGHT BEFORE SURGERY and the MORNING OF SURGERY with CHG Soap.   If you chose to wash your hair, wash your hair first as usual with your normal shampoo.  After you shampoo, rinse your hair and body thoroughly to remove the shampoo.  Then [employer] and genitals (private parts) with your normal soap and rinse thoroughly to remove soap.  After that Use CHG Soap as you would any other liquid soap. You can apply CHG directly to the skin and wash gently with a scrungie or a clean washcloth.   Apply the CHG Soap to your body ONLY FROM THE NECK DOWN.  Do not use on open wounds or open sores. Avoid contact with your eyes, ears, mouth and genitals (private parts). Wash Face and genitals (private parts)  with your normal soap.   Wash thoroughly, paying special attention to the area where your surgery will be performed.  Thoroughly rinse your body with warm water from the neck down.  DO NOT shower/wash with your normal soap after using and rinsing off the CHG Soap.  Pat yourself dry with a CLEAN TOWEL.  Wear CLEAN PAJAMAS to bed the night before surgery  Place CLEAN SHEETS on your bed the night before your surgery  DO NOT SLEEP WITH PETS.   Day of Surgery:  Take a shower with CHG soap. Wear Clean/Comfortable clothing the morning of surgery Do not apply any deodorants/lotions.   Remember  to brush your teeth WITH YOUR REGULAR TOOTHPASTE.   Please read over the following fact sheets that you were given.

## 2021-09-19 ENCOUNTER — Encounter (HOSPITAL_COMMUNITY)
Admission: RE | Admit: 2021-09-19 | Discharge: 2021-09-19 | Disposition: A | Discharge status: Home / Self Care | Payer: 59 | Source: Ambulatory Visit | Attending: Surgery | Admitting: Surgery

## 2021-09-19 ENCOUNTER — Encounter (HOSPITAL_COMMUNITY): Payer: Self-pay

## 2021-09-19 ENCOUNTER — Other Ambulatory Visit: Payer: Self-pay

## 2021-09-19 VITALS — BP 142/98 | HR 94 | Temp 98.6°F | Resp 19 | Ht 75.0 in | Wt 322.3 lb

## 2021-09-19 DIAGNOSIS — Z6841 Body Mass Index (BMI) 40.0 and over, adult: Secondary | ICD-10-CM | POA: Insufficient documentation

## 2021-09-19 DIAGNOSIS — Z01818 Encounter for other preprocedural examination: Secondary | ICD-10-CM | POA: Insufficient documentation

## 2021-09-19 DIAGNOSIS — E1122 Type 2 diabetes mellitus with diabetic chronic kidney disease: Secondary | ICD-10-CM | POA: Diagnosis not present

## 2021-09-19 DIAGNOSIS — N1831 Chronic kidney disease, stage 3a: Secondary | ICD-10-CM | POA: Diagnosis not present

## 2021-09-19 DIAGNOSIS — K432 Incisional hernia without obstruction or gangrene: Secondary | ICD-10-CM | POA: Insufficient documentation

## 2021-09-19 DIAGNOSIS — I129 Hypertensive chronic kidney disease with stage 1 through stage 4 chronic kidney disease, or unspecified chronic kidney disease: Secondary | ICD-10-CM | POA: Insufficient documentation

## 2021-09-19 DIAGNOSIS — E118 Type 2 diabetes mellitus with unspecified complications: Secondary | ICD-10-CM

## 2021-09-19 HISTORY — DX: Other complications of anesthesia, initial encounter: T88.59XA

## 2021-09-19 LAB — BASIC METABOLIC PANEL
Anion gap: 10 (ref 5–15)
BUN: 15 mg/dL (ref 6–20)
CO2: 25 mmol/L (ref 22–32)
Calcium: 9.6 mg/dL (ref 8.9–10.3)
Chloride: 106 mmol/L (ref 98–111)
Creatinine, Ser: 1.65 mg/dL — ABNORMAL HIGH (ref 0.61–1.24)
GFR, Estimated: 50 mL/min — ABNORMAL LOW (ref >60)
Glucose, Bld: 111 mg/dL — ABNORMAL HIGH (ref 70–99)
Potassium: 3.4 mmol/L — ABNORMAL LOW (ref 3.5–5.1)
Sodium: 141 mmol/L (ref 135–145)

## 2021-09-19 LAB — CBC
HCT: 41.9 % (ref 39.0–52.0)
Hemoglobin: 13.3 g/dL (ref 13.0–17.0)
MCH: 26.5 pg (ref 26.0–34.0)
MCHC: 31.7 g/dL (ref 30.0–36.0)
MCV: 83.5 fL (ref 80.0–100.0)
Platelets: 208 10*3/uL (ref 150–400)
RBC: 5.02 MIL/uL (ref 4.22–5.81)
RDW: 14.1 % (ref 11.5–15.5)
WBC: 7.5 10*3/uL (ref 4.0–10.5)
nRBC: 0 % (ref 0.0–0.2)

## 2021-09-19 LAB — GLUCOSE, CAPILLARY: Glucose-Capillary: 120 mg/dL — ABNORMAL HIGH (ref 70–99)

## 2021-09-19 LAB — HEMOGLOBIN A1C
Hgb A1c MFr Bld: 6.4 % — ABNORMAL HIGH (ref 4.8–5.6)
Mean Plasma Glucose: 136.98 mg/dL

## 2021-09-19 NOTE — Progress Notes (Addendum)
PCP - Dr. Jill Alexanders  Cardiologist - Denies  Chest x-ray - Not indicated EKG - 09/19/21  Sleep Study - Denies  DM - Type II Fasting Blood Sugar - averages 110 CBG at PAT appt 120 fasting  Checks Blood Sugar daily   ERAS Protcol - Yes   COVID TEST- 09/24/21  Anesthesia review: Yes abnormal EKG  Patient denies shortness of breath, fever, cough and chest pain at PAT appointment   All instructions explained to the patient, with a verbal understanding of the material. Patient agrees to go over the instructions while at home for a better understanding. Patient also instructed to wear a mask while in public after being tested for COVID-19. The opportunity to ask questions was provided.

## 2021-09-19 NOTE — Progress Notes (Signed)
   09/19/21 0827  OBSTRUCTIVE SLEEP APNEA  Have you ever been diagnosed with sleep apnea through a sleep study? No  Do you snore loudly (loud enough to be heard through closed doors)?  0  Do you often feel tired, fatigued, or sleepy during the daytime (such as falling asleep during driving or talking to someone)? 0  Has anyone observed you stop breathing during your sleep? 0  Do you have, or are you being treated for high blood pressure? 1  BMI more than 35 kg/m2? 1  Age > 50 (1-yes) 1  Neck circumference greater than:Male 16 inches or larger, Male 17inches or larger? 1  Male Gender (Yes=1) 1  Obstructive Sleep Apnea Score 5  Score 5 or greater  Results sent to PCP

## 2021-09-20 ENCOUNTER — Encounter (HOSPITAL_COMMUNITY): Payer: Self-pay

## 2021-09-20 NOTE — Anesthesia Preprocedure Evaluation (Addendum)
Anesthesia Evaluation  Patient identified by MRN, date of birth, ID band  Airway Mallampati: III  TM Distance: >3 FB Neck ROM: Full    Dental no notable dental hx. (+) Missing,    Pulmonary neg pulmonary ROS, Sleep apnea: suggestive features, no formal diagnosis. ,    Pulmonary exam normal breath sounds clear to auscultation       Cardiovascular Exercise Tolerance: Good hypertension, Pt. on medications Normal cardiovascular exam Rhythm:Regular Rate:Normal  Normal sinus rhythm Non-specific intra-ventricular conduction block Abnormal ECG Inferior T wave inversion has improved Confirmed by Shelva Majestic [id number]) on 09/19/2021 1:35:43 PM   Neuro/Psych negative neurological ROS  negative psych ROS   GI/Hepatic Neg liver ROS, Incisional hernia   Endo/Other  diabetesMorbid obesity  Renal/GU Renal InsufficiencyRenal disease  negative genitourinary   Musculoskeletal  (+) Arthritis ,   Abdominal   Peds negative pediatric ROS (+)  Hematology negative hematology ROS (+)   Anesthesia Other Findings   Reproductive/Obstetrics negative OB ROS                          Anesthesia Physical Anesthesia Plan  ASA: 3  Anesthesia Plan: General   Post-op Pain Management:    Induction: Intravenous  PONV Risk Score and Plan: Treatment may vary due to age or medical condition, Ondansetron, Dexamethasone and Midazolam  Airway Management Planned: Oral ETT  Additional Equipment: None  Intra-op Plan:   Post-operative Plan: Extubation in OR  Informed Consent: I have reviewed the patients History and Physical, chart, labs and discussed the procedure including the risks, benefits and alternatives for the proposed anesthesia with the patient or authorized representative who has indicated his/her understanding and acceptance.     Dental advisory given  Plan Discussed with: CRNA and Anesthesiologist  Anesthesia  Plan Comments: (PAT note written 09/20/2021 by Myra Gianotti, PA-C. )       Anesthesia Quick Evaluation

## 2021-09-20 NOTE — Progress Notes (Signed)
Anesthesia Chart Review:  Case: [id number] Date/Time: 09/26/21 0945   Procedure: LAPAROSCOPIC possible open INCISIONAL HERNIA REPAIR WITH MESH   Anesthesia type: General   Pre-op diagnosis: recurrent incisional hernia   Location: [place] OR ROOM 09 / [place] OR   Surgeons: Coralie Keens, MD       DISCUSSION: Patient is a 50 year old male scheduled for the above procedure. He was referred by his PCP Dr. Redmond School.  History includes never smoker, DM2 (diagnosed 12/2014), HTN, HLD, hernia (umbilical hernia repair 6/37/85, removal of Ventralex mesh patch and repair with Prolite mesh anteriorly 06/04/06;  incarcerated recurrent ventral hernia s/p emergent repair 08/28/14), spinal surgery. His labs work suggests CKD stage II-IIIa, as Creatinine has ranged 1.33-1.52 with eGFR ~ 52-72 between 10/2019-06/2021. (Chest pain was entered into history on 12/13/19 but the MD provider Coralie Common, MD at [facility] documented "denies chest pain, chest pressure". No chest pain at 09/19/21 PAT RN visit.). BMI 40, consistent with morbid obesity. Wife recalled that he had "trouble waking after previous surgery"--no apparent anesthesia complications documented with 08/28/14 ventral hernia repair.   Preoperative labs showed BUN 15, Creatinine 1.65 (up a little from 1.40-1.52 since May 2021). A1c 6.4%, down from 6.7% on 06/19/21. Since Creatinine was been mildly elevated in the past, I did not enter order to repeat prior to surgery, but I did route the 09/19/21 labs to Dr. Redmond School for future follow-up purposes. 09/19/21 EKG showed NSR, non-specific intraventricular conduction block, improved inferior T wave abnormality when compared to 03/05/16 and 12/13/19 tracings.  Preoperative COVID-19 test is scheduled for 09/24/21. Anesthesia team to evaluate on the day of surgery.    VS: BP (!) 142/98   Pulse 94   Temp 37 C (Oral)   Resp 19   Ht '6\' 3"'  (1.905 m)   Wt (!) 146.2 kg   SpO2 100%   BMI 40.28 kg/m     PROVIDERS: Denita Lung, MD is PCP. Last evaluation 06/19/21 for complete physical. He was referred to general surgery for "quite large" ventral hernia, asymptomatic at that time. Also referred to GI for colonoscopy re: adenomatous colon polyp.    LABS: Preoperative labs noted. See DISCUSSION. (all labs ordered are listed, but only abnormal results are displayed)  Labs Reviewed  GLUCOSE, CAPILLARY - Abnormal; Notable for the following components:      Result Value   Glucose-Capillary 120 (*)    All other components within normal limits  BASIC METABOLIC PANEL - Abnormal; Notable for the following components:   Potassium 3.4 (*)    Glucose, Bld 111 (*)    Creatinine, Ser 1.65 (*)    GFR, Estimated 50 (*)    All other components within normal limits  HEMOGLOBIN A1C - Abnormal; Notable for the following components:   Hgb A1c MFr Bld 6.4 (*)    All other components within normal limits  CBC    EKG: 09/19/21: Normal sinus rhythm Non-specific intra-ventricular conduction block Abnormal ECG Inferior T wave inversion has improved Confirmed by Shelva Majestic [phone]) on 09/19/2021 1:35:43 PM  CV: N/A  Past Medical History:  Diagnosis Date   Back pain    Chest pain    Complication of anesthesia    Per pt's wife trouble waking after previous surgery   Diabetes mellitus without complication ([place]) 77/4128   Hyperlipidemia    Hypertension     Past Surgical History:  Procedure Laterality Date   arthroscopic to knee for meniscus tear     LUMBAR  FUSION     LUMBAR LAMINECTOMY     umbilical hernia repair with mesh x 2     VENTRAL HERNIA REPAIR N/A 08/28/2014   Procedure: HERNIA REPAIR VENTRAL ADULT;  Surgeon: Doreen Salvage, MD;  Location: [facility];  Service: General;  Laterality: N/A;    MEDICATIONS:  amLODipine (NORVASC) 10 MG tablet   atorvastatin (LIPITOR) 20 MG tablet   Blood Glucose Monitoring Suppl DEVI   cetirizine (ZYRTEC) 10 MG tablet   Cholecalciferol (VITAMIN D3) 125 MCG  (5000 UT) CAPS   EDARBYCLOR 40-25 MG TABS   glucose blood test strip   Melatonin 10 MG TABS   metFORMIN (GLUCOPHAGE) 1000 MG tablet   metoprolol (TOPROL-XL) 200 MG 24 hr tablet   MOUNJARO 5 MG/0.5ML Pen   ONETOUCH DELICA LANCETS FINE MISC   OVER THE COUNTER MEDICATION   terazosin (HYTRIN) 2 MG capsule   No current facility-administered medications for this encounter.    Myra Gianotti, PA-C Surgical Short Stay/Anesthesiology [facility] Phone [phone] [facility] Phone [phone] 09/20/2021 3:10 PM

## 2021-09-21 ENCOUNTER — Other Ambulatory Visit (INDEPENDENT_AMBULATORY_CARE_PROVIDER_SITE_OTHER): Payer: 59

## 2021-09-21 ENCOUNTER — Other Ambulatory Visit: Payer: Self-pay

## 2021-09-21 DIAGNOSIS — Z23 Encounter for immunization: Secondary | ICD-10-CM

## 2021-09-24 ENCOUNTER — Other Ambulatory Visit (HOSPITAL_COMMUNITY)
Admission: RE | Admit: 2021-09-24 | Discharge: 2021-09-24 | Disposition: A | Discharge status: Home / Self Care | Payer: 59 | Source: Ambulatory Visit | Attending: Surgery | Admitting: Surgery

## 2021-09-24 DIAGNOSIS — Z20822 Contact with and (suspected) exposure to covid-19: Secondary | ICD-10-CM | POA: Diagnosis not present

## 2021-09-24 DIAGNOSIS — Z01812 Encounter for preprocedural laboratory examination: Secondary | ICD-10-CM | POA: Diagnosis present

## 2021-09-24 DIAGNOSIS — Z01818 Encounter for other preprocedural examination: Secondary | ICD-10-CM

## 2021-09-24 LAB — SARS CORONAVIRUS 2 (TAT 6-24 HRS): SARS Coronavirus 2: NEGATIVE

## 2021-09-25 NOTE — H&P (Signed)
REFERRING PHYSICIAN: Wyatt Haste, *  PROVIDER: Beverlee Nims, MD  MRN: [id number]  DATE OF ENCOUNTER: 08/03/2021  Subjective   Chief Complaint: Hernia   History of Present Illness: Vincent Cantu is a 50 y.o. male who is seen today as an office consultation at the request of Dr. Redmond School for evaluation of Hernia .   This is a pleasant gentleman referred here for evaluation of recurrent incisional hernia. He underwent emergent repair of an incarcerated incisional hernia in 2015 by Dr. Hulen Skains. Recently, he has noticed a bulge near the area of repair in his abdomen. He has mild discomfort but no obstructive symptoms. He is otherwise without complaints. He has had no nausea or vomiting. The pain is only mild. He has no cardiopulmonary issues and otherwise is doing well.  Review of Systems: A complete review of systems was obtained from the patient. I have reviewed this information and discussed as appropriate with the patient. See HPI as well for other ROS.  ROS   Medical History: Past Medical History:  Diagnosis Date   Diabetes mellitus without complication (CMS-HCC)   Hypertension   Patient Active Problem List  Diagnosis   Adenomatous polyp of colon   Allergic rhinitis   Arthritis   Chronic pain of left knee   History of COVID-19   Hyperlipidemia associated with type 2 diabetes mellitus (CMS-HCC)   Hypertension associated with diabetes (CMS-HCC)   Morbid obesity (CMS-HCC)   Type 2 diabetes with complication (CMS-HCC)   Past Surgical History:  Procedure Laterality Date   Back Surgery   HERNIA REPAIR    Not on File  Current Outpatient Medications on File Prior to Visit  Medication Sig Dispense Refill   amLODIPine (NORVASC) 10 MG tablet Take 1 tablet by mouth once daily   atorvastatin (LIPITOR) 20 MG tablet Take 1 tablet by mouth once daily   azilsartan med-chlorthalidone (EDARBYCLOR) 40-25 mg Tab Take 1 tablet by mouth once daily   ergocalciferol,  vitamin D2, 1,250 mcg (50,000 unit) capsule Take 50,000 Units by mouth every 7 (seven) days   metFORMIN (GLUCOPHAGE) 1000 MG tablet Take 1 tablet by mouth 2 (two) times daily   metoprolol succinate (TOPROL-XL) 200 MG XL tablet Take 1 tablet by mouth once daily   terazosin (HYTRIN) 2 MG capsule Take 1 capsule by mouth at bedtime   tirzepatide (MOUNJARO) 5 mg/0.5 mL PnIj Inject subcutaneously   No current facility-administered medications on file prior to visit.   History reviewed. No pertinent family history.   Social History   Tobacco Use  Smoking Status Never Smoker  Smokeless Tobacco Never Used    Social History   Socioeconomic History   Marital status: Married  Tobacco Use   Smoking status: Never Smoker   Smokeless tobacco: Never Used  Scientific laboratory technician Use: Never used  Substance and Sexual Activity   Alcohol use: Not Currently   Drug use: Never   Objective:   Vitals:  08/03/21 0952  BP: (!) 142/88  Pulse: 88  Temp: 36.8 C (98.3 F)  SpO2: 99%  Weight: (!) 145.9 kg (321 lb 9.6 oz)  Height: 190.5 cm (6\' 3" )   Body mass index is 40.2 kg/m.  Physical Exam   He appears well on exam  His abdomen is soft and nontender. He has rectus diastases.  He has a well-healed incision at the umbilicus. Just the. This is a incarcerated, nontender recurrent hernia. It is mild to moderate in size.  Lungs clear  CV RRR  Neuro grossly intact  Assessment and Plan:  Diagnoses and all orders for this visit:  Recurrent incisional hernia with incarceration - [facility] Case Posting Request; Future    I have reviewed his notes in the electronic medical records. He has recurrent incisional hernia. I discussed the diagnosis with the patient and his wife. We discussed the need for a hernia repair with mesh. I discussed both the laparoscopic and open techniques. He wishes to proceed with a laparoscopic incisional hernia repair with mesh. We discussed the risks. These include but are  not limited to bleeding, infection, injury to surrounding structures, the use of mesh, the need to convert to an open procedure, cardiopulmonary issues, postoperative recovery, etc. Again, they understand and wish to proceed with surgery which will be scheduled.

## 2021-09-26 ENCOUNTER — Encounter (HOSPITAL_COMMUNITY)
Admission: RE | Disposition: A | Discharge status: Home / Self Care | Payer: Self-pay | Source: Home / Self Care | Attending: Surgery

## 2021-09-26 ENCOUNTER — Ambulatory Visit (HOSPITAL_COMMUNITY): Payer: 59 | Admitting: Vascular Surgery

## 2021-09-26 ENCOUNTER — Observation Stay (HOSPITAL_COMMUNITY)
Admission: RE | Admit: 2021-09-26 | Discharge: 2021-09-27 | Disposition: A | Discharge status: Home / Self Care | Payer: 59 | Attending: Surgery | Admitting: Surgery

## 2021-09-26 ENCOUNTER — Ambulatory Visit (HOSPITAL_COMMUNITY): Payer: 59 | Admitting: Anesthesiology

## 2021-09-26 ENCOUNTER — Encounter (HOSPITAL_COMMUNITY): Payer: Self-pay | Admitting: Surgery

## 2021-09-26 ENCOUNTER — Other Ambulatory Visit: Payer: Self-pay

## 2021-09-26 DIAGNOSIS — Z7984 Long term (current) use of oral hypoglycemic drugs: Secondary | ICD-10-CM | POA: Insufficient documentation

## 2021-09-26 DIAGNOSIS — Z8616 Personal history of COVID-19: Secondary | ICD-10-CM | POA: Insufficient documentation

## 2021-09-26 DIAGNOSIS — E119 Type 2 diabetes mellitus without complications: Secondary | ICD-10-CM | POA: Diagnosis not present

## 2021-09-26 DIAGNOSIS — K432 Incisional hernia without obstruction or gangrene: Secondary | ICD-10-CM | POA: Diagnosis present

## 2021-09-26 DIAGNOSIS — K430 Incisional hernia with obstruction, without gangrene: Secondary | ICD-10-CM | POA: Diagnosis present

## 2021-09-26 DIAGNOSIS — I10 Essential (primary) hypertension: Secondary | ICD-10-CM | POA: Insufficient documentation

## 2021-09-26 DIAGNOSIS — E785 Hyperlipidemia, unspecified: Secondary | ICD-10-CM | POA: Insufficient documentation

## 2021-09-26 DIAGNOSIS — Z79899 Other long term (current) drug therapy: Secondary | ICD-10-CM | POA: Diagnosis not present

## 2021-09-26 HISTORY — PX: INCISIONAL HERNIA REPAIR: SHX193

## 2021-09-26 HISTORY — PX: INSERTION OF MESH: SHX5868

## 2021-09-26 LAB — GLUCOSE, CAPILLARY
Glucose-Capillary: 101 mg/dL — ABNORMAL HIGH (ref 70–99)
Glucose-Capillary: 138 mg/dL — ABNORMAL HIGH (ref 70–99)
Glucose-Capillary: 138 mg/dL — ABNORMAL HIGH (ref 70–99)
Glucose-Capillary: 129 mg/dL — ABNORMAL HIGH (ref 70–99)

## 2021-09-26 SURGERY — REPAIR, HERNIA, INCISIONAL, LAPAROSCOPIC
Anesthesia: General | Site: Abdomen

## 2021-09-26 MED ORDER — LACTATED RINGERS IV SOLN: INTRAVENOUS | Status: DC

## 2021-09-26 MED ORDER — GABAPENTIN 300 MG PO CAPS
300.0000 mg | ORAL_CAPSULE | Freq: Two times a day (BID) | ORAL | Status: DC
  Administered 2021-09-26 – 2021-09-27 (×2): 300 mg via ORAL
  Filled 2021-09-26 (×2): qty 1

## 2021-09-26 MED ORDER — ACETAMINOPHEN 500 MG PO TABS: 1000.0000 mg | ORAL_TABLET | ORAL | Status: AC

## 2021-09-26 MED ORDER — DIPHENHYDRAMINE HCL 50 MG/ML IJ SOLN: 25.0000 mg | Freq: Four times a day (QID) | INTRAMUSCULAR | Status: DC | PRN

## 2021-09-26 MED ORDER — FENTANYL CITRATE (PF) 100 MCG/2ML IJ SOLN
25.0000 ug | INTRAMUSCULAR | Status: DC | PRN
  Administered 2021-09-26 (×2): 25 ug via INTRAVENOUS
  Administered 2021-09-26: 50 ug via INTRAVENOUS

## 2021-09-26 MED ORDER — ORAL CARE MOUTH RINSE: 15.0000 mL | Freq: Once | OROMUCOSAL | Status: AC

## 2021-09-26 MED ORDER — CEFAZOLIN IN SODIUM CHLORIDE 3-0.9 GM/100ML-% IV SOLN
INTRAVENOUS | Status: AC
  Filled 2021-09-26: qty 100

## 2021-09-26 MED ORDER — TRAMADOL HCL 50 MG PO TABS
50.0000 mg | ORAL_TABLET | Freq: Four times a day (QID) | ORAL | Status: DC | PRN
  Administered 2021-09-26: 50 mg via ORAL
  Filled 2021-09-26: qty 1

## 2021-09-26 MED ORDER — DEXAMETHASONE SODIUM PHOSPHATE 10 MG/ML IJ SOLN
INTRAMUSCULAR | Status: DC | PRN
  Administered 2021-09-26: 5 mg via INTRAVENOUS

## 2021-09-26 MED ORDER — SODIUM CHLORIDE 0.9 % IV SOLN: INTRAVENOUS | Status: DC

## 2021-09-26 MED ORDER — SUCCINYLCHOLINE CHLORIDE 200 MG/10ML IV SOSY
PREFILLED_SYRINGE | INTRAVENOUS | Status: DC | PRN
  Administered 2021-09-26: 180 mg via INTRAVENOUS

## 2021-09-26 MED ORDER — DEXAMETHASONE SODIUM PHOSPHATE 10 MG/ML IJ SOLN
INTRAMUSCULAR | Status: AC
  Filled 2021-09-26: qty 1

## 2021-09-26 MED ORDER — PROPOFOL 10 MG/ML IV BOLUS
INTRAVENOUS | Status: AC
  Filled 2021-09-26: qty 20

## 2021-09-26 MED ORDER — SUCCINYLCHOLINE CHLORIDE 200 MG/10ML IV SOSY
PREFILLED_SYRINGE | INTRAVENOUS | Status: AC
  Filled 2021-09-26: qty 10

## 2021-09-26 MED ORDER — PHENYLEPHRINE 40 MCG/ML (10ML) SYRINGE FOR IV PUSH (FOR BLOOD PRESSURE SUPPORT)
PREFILLED_SYRINGE | INTRAVENOUS | Status: DC | PRN
  Administered 2021-09-26: 40 ug via INTRAVENOUS
  Administered 2021-09-26: 120 ug via INTRAVENOUS
  Administered 2021-09-26 (×2): 80 ug via INTRAVENOUS

## 2021-09-26 MED ORDER — PHENYLEPHRINE HCL-NACL 20-0.9 MG/250ML-% IV SOLN
INTRAVENOUS | Status: DC | PRN
  Administered 2021-09-26: 30 ug/min via INTRAVENOUS

## 2021-09-26 MED ORDER — BUPIVACAINE-EPINEPHRINE 0.25% -1:200000 IJ SOLN
INTRAMUSCULAR | Status: DC | PRN
  Administered 2021-09-26: 20 mL

## 2021-09-26 MED ORDER — EPHEDRINE SULFATE 50 MG/ML IJ SOLN
INTRAMUSCULAR | Status: DC | PRN
  Administered 2021-09-26: 5 mg via INTRAVENOUS

## 2021-09-26 MED ORDER — CHLORHEXIDINE GLUCONATE 0.12 % MT SOLN: 15.0000 mL | Freq: Once | OROMUCOSAL | Status: AC

## 2021-09-26 MED ORDER — BUPIVACAINE-EPINEPHRINE (PF) 0.25% -1:200000 IJ SOLN
INTRAMUSCULAR | Status: AC
  Filled 2021-09-26: qty 30

## 2021-09-26 MED ORDER — IRBESARTAN 150 MG PO TABS
150.0000 mg | ORAL_TABLET | Freq: Every day | ORAL | Status: DC
  Administered 2021-09-27: 150 mg via ORAL
  Filled 2021-09-26: qty 1

## 2021-09-26 MED ORDER — INSULIN ASPART 100 UNIT/ML IJ SOLN: 0.0000 [IU] | Freq: Every day | INTRAMUSCULAR | Status: DC

## 2021-09-26 MED ORDER — AMLODIPINE BESYLATE 10 MG PO TABS
10.0000 mg | ORAL_TABLET | Freq: Every day | ORAL | Status: DC
  Administered 2021-09-27: 10 mg via ORAL
  Filled 2021-09-26: qty 1

## 2021-09-26 MED ORDER — DIPHENHYDRAMINE HCL 25 MG PO CAPS: 25.0000 mg | ORAL_CAPSULE | Freq: Four times a day (QID) | ORAL | Status: DC | PRN

## 2021-09-26 MED ORDER — ROCURONIUM BROMIDE 10 MG/ML (PF) SYRINGE
PREFILLED_SYRINGE | INTRAVENOUS | Status: DC | PRN
  Administered 2021-09-26: 60 mg via INTRAVENOUS
  Administered 2021-09-26: 20 mg via INTRAVENOUS

## 2021-09-26 MED ORDER — LIDOCAINE 2% (20 MG/ML) 5 ML SYRINGE
INTRAMUSCULAR | Status: DC | PRN
  Administered 2021-09-26: 100 mg via INTRAVENOUS

## 2021-09-26 MED ORDER — LIDOCAINE 2% (20 MG/ML) 5 ML SYRINGE
INTRAMUSCULAR | Status: AC
  Filled 2021-09-26: qty 5

## 2021-09-26 MED ORDER — PHENYLEPHRINE 40 MCG/ML (10ML) SYRINGE FOR IV PUSH (FOR BLOOD PRESSURE SUPPORT)
PREFILLED_SYRINGE | INTRAVENOUS | Status: AC
  Filled 2021-09-26: qty 10

## 2021-09-26 MED ORDER — ROCURONIUM BROMIDE 10 MG/ML (PF) SYRINGE
PREFILLED_SYRINGE | INTRAVENOUS | Status: AC
  Filled 2021-09-26: qty 10

## 2021-09-26 MED ORDER — TERAZOSIN HCL 1 MG PO CAPS
2.0000 mg | ORAL_CAPSULE | Freq: Every day | ORAL | Status: DC
  Administered 2021-09-26: 2 mg via ORAL
  Filled 2021-09-26 (×3): qty 2

## 2021-09-26 MED ORDER — FENTANYL CITRATE (PF) 250 MCG/5ML IJ SOLN
INTRAMUSCULAR | Status: AC
  Filled 2021-09-26: qty 5

## 2021-09-26 MED ORDER — METHOCARBAMOL 500 MG PO TABS
500.0000 mg | ORAL_TABLET | Freq: Four times a day (QID) | ORAL | Status: DC | PRN
  Administered 2021-09-26: 500 mg via ORAL
  Filled 2021-09-26: qty 1

## 2021-09-26 MED ORDER — MELATONIN 5 MG PO TABS
10.0000 mg | ORAL_TABLET | Freq: Every day | ORAL | Status: DC
  Administered 2021-09-26: 10 mg via ORAL
  Filled 2021-09-26: qty 2

## 2021-09-26 MED ORDER — SODIUM CHLORIDE 0.9 % IR SOLN
Status: DC | PRN
  Administered 2021-09-26: 1000 mL

## 2021-09-26 MED ORDER — AZILSARTAN-CHLORTHALIDONE 40-25 MG PO TABS: 1.0000 | ORAL_TABLET | Freq: Every day | ORAL | Status: DC

## 2021-09-26 MED ORDER — ACETAMINOPHEN 500 MG PO TABS
1000.0000 mg | ORAL_TABLET | Freq: Four times a day (QID) | ORAL | Status: DC
  Administered 2021-09-26 – 2021-09-27 (×4): 1000 mg via ORAL
  Filled 2021-09-26 (×4): qty 2

## 2021-09-26 MED ORDER — ACETAMINOPHEN 500 MG PO TABS
ORAL_TABLET | ORAL | Status: AC
  Administered 2021-09-26: 1000 mg via ORAL
  Filled 2021-09-26: qty 2

## 2021-09-26 MED ORDER — HYDROMORPHONE HCL 1 MG/ML IJ SOLN: 1.0000 mg | INTRAMUSCULAR | Status: DC | PRN

## 2021-09-26 MED ORDER — METOPROLOL SUCCINATE ER 100 MG PO TB24
200.0000 mg | ORAL_TABLET | Freq: Every day | ORAL | Status: DC
  Administered 2021-09-26 – 2021-09-27 (×2): 200 mg via ORAL
  Filled 2021-09-26 (×2): qty 2

## 2021-09-26 MED ORDER — ENOXAPARIN SODIUM 40 MG/0.4ML IJ SOSY
40.0000 mg | PREFILLED_SYRINGE | INTRAMUSCULAR | Status: DC
  Administered 2021-09-27: 40 mg via SUBCUTANEOUS
  Filled 2021-09-26: qty 0.4

## 2021-09-26 MED ORDER — SUGAMMADEX SODIUM 200 MG/2ML IV SOLN
INTRAVENOUS | Status: DC | PRN
  Administered 2021-09-26: 400 mg via INTRAVENOUS

## 2021-09-26 MED ORDER — OXYCODONE HCL 5 MG/5ML PO SOLN: 5.0000 mg | Freq: Once | ORAL | Status: DC | PRN

## 2021-09-26 MED ORDER — PROMETHAZINE HCL 25 MG/ML IJ SOLN: 6.2500 mg | INTRAMUSCULAR | Status: DC | PRN

## 2021-09-26 MED ORDER — ONDANSETRON HCL 4 MG/2ML IJ SOLN
INTRAMUSCULAR | Status: DC | PRN
  Administered 2021-09-26: 4 mg via INTRAVENOUS

## 2021-09-26 MED ORDER — PROPOFOL 10 MG/ML IV BOLUS
INTRAVENOUS | Status: DC | PRN
  Administered 2021-09-26: 200 mg via INTRAVENOUS

## 2021-09-26 MED ORDER — ATORVASTATIN CALCIUM 10 MG PO TABS
20.0000 mg | ORAL_TABLET | Freq: Every day | ORAL | Status: DC
  Administered 2021-09-27: 20 mg via ORAL
  Filled 2021-09-26: qty 2

## 2021-09-26 MED ORDER — CHLORTHALIDONE 25 MG PO TABS
25.0000 mg | ORAL_TABLET | Freq: Every day | ORAL | Status: DC
  Administered 2021-09-27: 25 mg via ORAL
  Filled 2021-09-26: qty 1

## 2021-09-26 MED ORDER — FENTANYL CITRATE (PF) 100 MCG/2ML IJ SOLN
INTRAMUSCULAR | Status: AC
  Filled 2021-09-26: qty 2

## 2021-09-26 MED ORDER — INSULIN ASPART 100 UNIT/ML IJ SOLN
0.0000 [IU] | Freq: Three times a day (TID) | INTRAMUSCULAR | Status: DC
Start: 2021-09-26 — End: 2021-09-27
  Administered 2021-09-26 – 2021-09-27 (×2): 2 [IU] via SUBCUTANEOUS

## 2021-09-26 MED ORDER — CHLORHEXIDINE GLUCONATE 0.12 % MT SOLN
OROMUCOSAL | Status: AC
  Administered 2021-09-26: 15 mL via OROMUCOSAL
  Filled 2021-09-26: qty 15

## 2021-09-26 MED ORDER — CHLORHEXIDINE GLUCONATE CLOTH 2 % EX PADS: 6.0000 | MEDICATED_PAD | Freq: Once | CUTANEOUS | Status: DC

## 2021-09-26 MED ORDER — OXYCODONE HCL 5 MG PO TABS: 5.0000 mg | ORAL_TABLET | ORAL | Status: DC | PRN

## 2021-09-26 MED ORDER — MIDAZOLAM HCL 2 MG/2ML IJ SOLN
INTRAMUSCULAR | Status: AC
  Filled 2021-09-26: qty 2

## 2021-09-26 MED ORDER — FENTANYL CITRATE (PF) 250 MCG/5ML IJ SOLN
INTRAMUSCULAR | Status: DC | PRN
  Administered 2021-09-26 (×2): 50 ug via INTRAVENOUS
  Administered 2021-09-26: 100 ug via INTRAVENOUS
  Administered 2021-09-26: 50 ug via INTRAVENOUS
  Administered 2021-09-26: 100 ug via INTRAVENOUS

## 2021-09-26 MED ORDER — ONDANSETRON 4 MG PO TBDP: 4.0000 mg | ORAL_TABLET | Freq: Four times a day (QID) | ORAL | Status: DC | PRN

## 2021-09-26 MED ORDER — ONDANSETRON HCL 4 MG/2ML IJ SOLN: 4.0000 mg | Freq: Four times a day (QID) | INTRAMUSCULAR | Status: DC | PRN

## 2021-09-26 MED ORDER — CEFAZOLIN IN SODIUM CHLORIDE 3-0.9 GM/100ML-% IV SOLN
3.0000 g | INTRAVENOUS | Status: AC
  Administered 2021-09-26: 3 g via INTRAVENOUS

## 2021-09-26 MED ORDER — OXYCODONE HCL 5 MG PO TABS: 5.0000 mg | ORAL_TABLET | Freq: Once | ORAL | Status: DC | PRN

## 2021-09-26 MED ORDER — MIDAZOLAM HCL 2 MG/2ML IJ SOLN
INTRAMUSCULAR | Status: DC | PRN
  Administered 2021-09-26: 2 mg via INTRAVENOUS

## 2021-09-26 MED ORDER — ONDANSETRON HCL 4 MG/2ML IJ SOLN
INTRAMUSCULAR | Status: AC
  Filled 2021-09-26: qty 2

## 2021-09-26 SURGICAL SUPPLY — 40 items
ADH SKN CLS APL DERMABOND .7 (GAUZE/BANDAGES/DRESSINGS) ×1
APL PRP STRL LF DISP 70% ISPRP (MISCELLANEOUS) ×1
BAG COUNTER SPONGE SURGICOUNT (BAG) ×2 IMPLANT
BAG SPNG CNTER NS LX DISP (BAG) ×1
BLADE CLIPPER SURG (BLADE) IMPLANT
CANISTER SUCT 3000ML PPV (MISCELLANEOUS) IMPLANT
CHLORAPREP W/TINT 26 (MISCELLANEOUS) ×2 IMPLANT
COVER SURGICAL LIGHT HANDLE (MISCELLANEOUS) ×2 IMPLANT
DECANTER SPIKE VIAL GLASS SM (MISCELLANEOUS) ×2 IMPLANT
DERMABOND ADVANCED (GAUZE/BANDAGES/DRESSINGS) ×1
DERMABOND ADVANCED .7 DNX12 (GAUZE/BANDAGES/DRESSINGS) ×1 IMPLANT
DEVICE SECURE STRAP 25 ABSORB (INSTRUMENTS) ×3 IMPLANT
DEVICE TROCAR PUNCTURE CLOSURE (ENDOMECHANICALS) ×2 IMPLANT
ELECT REM PT RETURN 9FT ADLT (ELECTROSURGICAL) ×2
ELECTRODE REM PT RTRN 9FT ADLT (ELECTROSURGICAL) ×1 IMPLANT
GLOVE SURG SIGNA 7.5 PF LTX (GLOVE) ×2 IMPLANT
GOWN STRL REUS W/ TWL LRG LVL3 (GOWN DISPOSABLE) ×2 IMPLANT
GOWN STRL REUS W/ TWL XL LVL3 (GOWN DISPOSABLE) ×1 IMPLANT
GOWN STRL REUS W/TWL LRG LVL3 (GOWN DISPOSABLE) ×4
GOWN STRL REUS W/TWL XL LVL3 (GOWN DISPOSABLE) ×2
KIT BASIN OR (CUSTOM PROCEDURE TRAY) ×2 IMPLANT
KIT TURNOVER KIT B (KITS) ×2 IMPLANT
MARKER SKIN DUAL TIP RULER LAB (MISCELLANEOUS) ×2 IMPLANT
MESH VENTRALIGHT ST 7X9N (Mesh General) ×1 IMPLANT
NDL SPNL 22GX3.5 QUINCKE BK (NEEDLE) ×1 IMPLANT
NEEDLE SPNL 22GX3.5 QUINCKE BK (NEEDLE) ×2 IMPLANT
NS IRRIG 1000ML POUR BTL (IV SOLUTION) ×2 IMPLANT
PAD ARMBOARD 7.5X6 YLW CONV (MISCELLANEOUS) ×4 IMPLANT
SCISSORS LAP 5X35 DISP (ENDOMECHANICALS) ×1 IMPLANT
SET IRRIG TUBING LAPAROSCOPIC (IRRIGATION / IRRIGATOR) ×1 IMPLANT
SET TUBE SMOKE EVAC HIGH FLOW (TUBING) ×2 IMPLANT
SLEEVE ENDOPATH XCEL 5M (ENDOMECHANICALS) ×2 IMPLANT
SUT MON AB 4-0 PC3 18 (SUTURE) ×2 IMPLANT
SUT NOVA NAB GS-21 0 18 T12 DT (SUTURE) ×2 IMPLANT
TOWEL GREEN STERILE (TOWEL DISPOSABLE) ×2 IMPLANT
TOWEL GREEN STERILE FF (TOWEL DISPOSABLE) ×2 IMPLANT
TRAY LAPAROSCOPIC MC (CUSTOM PROCEDURE TRAY) ×2 IMPLANT
TROCAR XCEL NON-BLD 11X100MML (ENDOMECHANICALS) ×2 IMPLANT
TROCAR XCEL NON-BLD 5MMX100MML (ENDOMECHANICALS) ×2 IMPLANT
WATER STERILE IRR 1000ML POUR (IV SOLUTION) ×2 IMPLANT

## 2021-09-26 NOTE — Op Note (Signed)
LAPAROSCOPIC  INCISIONAL HERNIA REPAIR, INSERTION OF MESH  Procedure Note  Vincent Cantu 09/26/2021   Pre-op Diagnosis: recurrent incisional hernia     Post-op Diagnosis: same  Procedure(s): LAPAROSCOPIC  INCISIONAL HERNIA REPAIR INSERTION OF MESH  Surgeon(s): Coralie Keens, MD  Anesthesia: General  Staff:  Circulator: Hal Morales, RN Scrub Person: Rometta Emery, RN; Courtney Heys S  Estimated Blood Loss: Minimal  Findings: The patient does not have multiple fascial defects at his previous hernia repair.  One measured approximately 4 to 5 cm and the next largest was about 2 cm.  The entire area was covered with a 22 cm x 17 cm piece of Prolene ventral light ST mesh from [facility]  Procedure: Patient brought to operating identifies correct patient.  He is placed upon the operating table general anesthesia was induced.  His abdomen was prepped and draped in usual sterile fashion.  I made a small incision in the patient's left upper quadrant with a scalpel.  I then used the 5 mm trocar and Optiview camera to slowly traversed all levels of the abdominal wall and gained entrance into the peritoneal cavity.  Insufflation of the abdomen was begun.  I evaluated the entrance site and saw no evidence of bowel injury.  I next placed another 5 mm trocar in the patient's left mid abdomen.  Patient had a moderate mount of omentum and stuck to the abdominal wall as well as the hernia defect.  With both blunt and sharp dissection I was able to free the omentum up from the abdominal wall.  The transverse colon was closely associated with the omentum but was not involved in the hernia sac itself.  There was only omentum in the hernia sac.  I was able to finally completely remove all the adhesions and reduced all contents from the hernia sac.  The abdominal wall was then completely cleared free.  There was one 5 cm fascial defect as well as another 2 cm defect and then laxity around his previous primary  repair.  I upsized the second 5 Miller trocar to an 11 mm trocar under direct vision.  I then placed a 5 mm trocar in the patient's right upper quadrant all under direct vision.  We measured the areas of the defect.  I then brought a 17 cm x 22 cm piece of [employer] ventral light mesh onto the field.  I placed 4 separate 0 Novafil sutures into the corners of the mesh.  The mesh was then rolled up and soaked in saline.  I then placed it through the 11 mm trocar.  I then unrolled it in the abdominal cavity.  I then made 4 separate stab incisions corresponding to the location of the sutures with a 11 blade scalpel.  I then used a suture passer to pull all sutures up through the abdominal wall and 4 separate locations.  I then tied these in place pulling the mesh up against the peritoneum.  I then tacked the mesh and circumferentially with absorbable tacks.  Wide coverage of the fascial defect appeared to be achieved and more than 4 cm in all directions.  We then reevaluated the omentum where the adhesions had been taken down and achieved hemostasis with the cautery.  I irrigated the abdomen with saline.  Hemostasis appeared to be achieved.  Again I saw no evidence of bowel injury.  At this point all trochars were removed under direct vision as the abdomen was slowly deflated.  All incisions  were then anesthetized Marcaine and closed with 4-0 Monocryl sutures and Dermabond.  The patient tolerated the procedure well.  All the counts were correct at the end of the procedure.  The patient was then extubated in the operating room and taken in stable addition to the recovery room.          Coralie Keens   Date: 09/26/2021  Time: 11:08 AM

## 2021-09-26 NOTE — Anesthesia Postprocedure Evaluation (Signed)
Anesthesia Post Note  Patient: [name]  Procedure(s) Performed: LAPAROSCOPIC  INCISIONAL HERNIA REPAIR (Abdomen) INSERTION OF MESH (Abdomen)     Patient location during evaluation: PACU Anesthesia Type: General Level of consciousness: awake and alert Pain management: pain level controlled Vital Signs Assessment: post-procedure vital signs reviewed and stable Respiratory status: spontaneous breathing, nonlabored ventilation, respiratory function stable and patient connected to nasal cannula oxygen Cardiovascular status: blood pressure returned to baseline and stable Postop Assessment: no apparent nausea or vomiting Anesthetic complications: no   No notable events documented.  Last Vitals:  Vitals:   09/26/21 1245 09/26/21 1315  BP: 115/77 112/79  Pulse: 78 75  Resp: (!) 7   Temp:    SpO2: 96% 95%    Last Pain:  Vitals:   09/26/21 1315  TempSrc:   PainSc: 0-No pain                 Belenda Cruise P Lorelee Mclaurin

## 2021-09-26 NOTE — Interval H&P Note (Signed)
History and Physical Interval Note: no change in H and P  09/26/2021 9:28 AM  Vincent Cantu  has presented today for surgery, with the diagnosis of recurrent incisional hernia.  The various methods of treatment have been discussed with the patient and family. After consideration of risks, benefits and other options for treatment, the patient has consented to  Procedure(s): LAPAROSCOPIC possible open [place] (N/A) as a surgical intervention.  The patient's history has been reviewed, patient examined, no change in status, stable for surgery.  I have reviewed the patient's chart and labs.  Questions were answered to the patient's satisfaction.     Coralie Keens

## 2021-09-26 NOTE — Anesthesia Procedure Notes (Signed)
Procedure Name: Intubation Date/Time: 09/26/2021 9:53 AM Performed by: Lorie Phenix, CRNA Pre-anesthesia Checklist: Patient identified, Emergency Drugs available, Suction available and Patient being monitored Patient Re-evaluated:Patient Re-evaluated prior to induction Oxygen Delivery Method: Circle system utilized Preoxygenation: Pre-oxygenation with 100% oxygen Induction Type: IV induction Ventilation: Two handed mask ventilation required Laryngoscope Size: Mac and 4 Grade View: Grade I Tube type: Oral Tube size: 7.5 mm Number of attempts: 1 Airway Equipment and Method: Stylet and Oral airway Placement Confirmation: ETT inserted through vocal cords under direct vision, positive ETCO2 and breath sounds checked- equal and bilateral Secured at: 23 cm Tube secured with: Tape Dental Injury: Teeth and Oropharynx as per pre-operative assessment

## 2021-09-26 NOTE — Transfer of Care (Signed)
Immediate Anesthesia Transfer of Care Note  Patient: KYLAN LIBERATI  Procedure(s) Performed: LAPAROSCOPIC  INCISIONAL HERNIA REPAIR (Abdomen) INSERTION OF MESH (Abdomen)  Patient Location: PACU  Anesthesia Type:General  Level of Consciousness: awake and alert   Airway & Oxygen Therapy: Patient Spontanous Breathing and Patient connected to face mask oxygen  Post-op Assessment: Report given to RN and Post -op Vital signs reviewed and stable  Post vital signs: Reviewed and stable  Last Vitals:  Vitals Value Taken Time  BP 128/80 09/26/21 1116  Temp    Pulse 80 09/26/21 1121  Resp 5 09/26/21 1121  SpO2 88 % 09/26/21 1121  Vitals shown include unvalidated device data.  Last Pain:  Vitals:   09/26/21 0842  TempSrc:   PainSc: 0-No pain      Patients Stated Pain Goal: 3 (20/94/70 9628)  Complications: No notable events documented.

## 2021-09-27 ENCOUNTER — Encounter (HOSPITAL_COMMUNITY): Payer: Self-pay | Admitting: Surgery

## 2021-09-27 DIAGNOSIS — K430 Incisional hernia with obstruction, without gangrene: Secondary | ICD-10-CM | POA: Diagnosis not present

## 2021-09-27 LAB — CBC
HCT: 40.5 % (ref 39.0–52.0)
Hemoglobin: 13.1 g/dL (ref 13.0–17.0)
MCH: 26.5 pg (ref 26.0–34.0)
MCHC: 32.3 g/dL (ref 30.0–36.0)
MCV: 82 fL (ref 80.0–100.0)
Platelets: 226 10*3/uL (ref 150–400)
RBC: 4.94 MIL/uL (ref 4.22–5.81)
RDW: 13.3 % (ref 11.5–15.5)
WBC: 13.7 10*3/uL — ABNORMAL HIGH (ref 4.0–10.5)
nRBC: 0 % (ref 0.0–0.2)

## 2021-09-27 LAB — BASIC METABOLIC PANEL
Anion gap: 8 (ref 5–15)
BUN: 14 mg/dL (ref 6–20)
CO2: 26 mmol/L (ref 22–32)
Calcium: 9 mg/dL (ref 8.9–10.3)
Chloride: 104 mmol/L (ref 98–111)
Creatinine, Ser: 1.52 mg/dL — ABNORMAL HIGH (ref 0.61–1.24)
GFR, Estimated: 55 mL/min — ABNORMAL LOW (ref >60)
Glucose, Bld: 116 mg/dL — ABNORMAL HIGH (ref 70–99)
Potassium: 3.6 mmol/L (ref 3.5–5.1)
Sodium: 138 mmol/L (ref 135–145)

## 2021-09-27 LAB — GLUCOSE, CAPILLARY: Glucose-Capillary: 132 mg/dL — ABNORMAL HIGH (ref 70–99)

## 2021-09-27 MED ORDER — TRAMADOL HCL 50 MG PO TABS: 50.0000 mg | ORAL_TABLET | Freq: Four times a day (QID) | ORAL | 0 refills | Status: DC | PRN

## 2021-09-27 NOTE — Discharge Summary (Signed)
Physician Discharge Summary  Patient ID: Vincent Cantu MRN: [id number] DOB/AGE: 18-Aug-1971 50 y.o.  Admit date: 09/26/2021 Discharge date: 09/27/2021  Admission Diagnoses:  Discharge Diagnoses:  Active Problems:   Incisional hernia   Discharged Condition: good  Hospital Course: Uneventful post op recovery.  Discharged home POD#1  Consults: None  Significant Diagnostic Studies:   Treatments: surgery: laparoscopic incisional hernia repair with mesh  Discharge Exam: Blood pressure 118/72, pulse 88, temperature 98.5 F (36.9 C), temperature source Oral, resp. rate 17, height 6\' 3"  (1.905 m), weight (!) 145.6 kg, SpO2 96 %. General appearance: alert, cooperative, and no distress Resp: clear to auscultation bilaterally Cardio: regular rate and rhythm, S1, S2 normal, no murmur, click, rub or gallop Incision/Wound: abdomen soft, incisions clean  Disposition: Discharge disposition: 01-Home or Self Care        Allergies as of 09/27/2021   No Known Allergies      Medication List     TAKE these medications    amLODipine 10 MG tablet Commonly known as: NORVASC TAKE 1 TABLET BY MOUTH EVERY DAY   atorvastatin 20 MG tablet Commonly known as: LIPITOR TAKE 1 TABLET BY MOUTH EVERY DAY   Blood Glucose Monitoring Suppl Devi Pt needs a one touch ultra meter   cetirizine 10 MG tablet Commonly known as: ZYRTEC Take 10 mg by mouth at bedtime.   Edarbyclor 40-25 MG Tabs Generic drug: Azilsartan-Chlorthalidone TAKE 1 TABLET BY MOUTH ONCE DAILY   glucose blood test strip Test once a day. Uses one touch ultra meter   Melatonin 10 MG Tabs Take 10 mg by mouth at bedtime.   metFORMIN 1000 MG tablet Commonly known as: GLUCOPHAGE TAKE 1 TABLET BY MOUTH TWICE A DAY   metoprolol 200 MG 24 hr tablet Commonly known as: TOPROL-XL TAKE 1 TABLET BY MOUTH EVERY DAY   Mounjaro 5 MG/0.5ML Pen Generic drug: tirzepatide INJECT 5 MG INTO THE SKIN ONCE A WEEK. What changed: See  the new instructions.   OneTouch Delica Lancets Fine Misc Test once a day   OVER THE COUNTER MEDICATION Take 1 capsule by mouth daily. Sea Moss with Beet Root   terazosin 2 MG capsule Commonly known as: HYTRIN TAKE 1 CAPSULE BY MOUTH AT BEDTIME.   traMADol 50 MG tablet Commonly known as: ULTRAM Take 1-2 tablets (50-100 mg total) by mouth every 6 (six) hours as needed.   Vitamin D3 125 MCG (5000 UT) Caps Take 5,000 Units by mouth daily.        Follow-up Information     Coralie Keens, MD. Schedule an appointment as soon as possible for a visit in 3 week(s).   Specialty: General Surgery Contact information: [address] [place] [address] [phone]                 Signed: Coralie Keens 09/27/2021, 6:32 AM

## 2021-09-27 NOTE — Plan of Care (Signed)
  Problem: Pain Managment: Goal: General experience of comfort will improve Outcome: Progressing   

## 2021-09-27 NOTE — Discharge Instructions (Signed)
[facility]  UMBILICAL OR INGUINAL HERNIA REPAIR: POST OP INSTRUCTIONS  Always review your discharge instruction sheet given to you by the facility where your surgery was performed. IF YOU HAVE DISABILITY OR FAMILY LEAVE FORMS, YOU MUST BRING THEM TO THE OFFICE FOR PROCESSING.   DO NOT GIVE THEM TO YOUR DOCTOR.  1. A  prescription for pain medication may be given to you upon discharge.  Take your pain medication as prescribed, if needed.  If narcotic pain medicine is not needed, then you may take acetaminophen (Tylenol) or ibuprofen (Advil) as needed. 2. Take your usually prescribed medications unless otherwise directed. If you need a refill on your pain medication, please contact your pharmacy.  They will contact our office to request authorization. Prescriptions will not be filled after 5 pm or on week-ends. 3. You should follow a light diet the first 24 hours after arrival home, such as soup and crackers, etc.  Be sure to include lots of fluids daily.  Resume your normal diet the day after surgery. 4.Most patients will experience some swelling and bruising around the umbilicus or in the groin and scrotum.  Ice packs and reclining will help.  Swelling and bruising can take several days to resolve.  6. It is common to experience some constipation if taking pain medication after surgery.  Increasing fluid intake and taking a stool softener (such as Colace) will usually help or prevent this problem from occurring.  A mild laxative (Milk of Magnesia or Miralax) should be taken according to package directions if there are no bowel movements after 48 hours. 7. Unless discharge instructions indicate otherwise, you may remove your bandages 24-48 hours after surgery, and you may shower at that time.  You may have steri-strips (small skin tapes) in place directly over the incision.  These strips should be left on the skin for 7-10 days.  If your surgeon used skin glue on the  incision, you may shower in 24 hours.  The glue will flake off over the next 2-3 weeks.  Any sutures or staples will be removed at the office during your follow-up visit. 8. ACTIVITIES:  You may resume regular (light) daily activities beginning the next day--such as daily self-care, walking, climbing stairs--gradually increasing activities as tolerated.  You may have sexual intercourse when it is comfortable.  Refrain from any heavy lifting or straining until approved by your doctor.  a.You may drive when you are no longer taking prescription pain medication, you can comfortably wear a seatbelt, and you can safely maneuver your car and apply brakes. b.RETURN TO WORK:   _____________________________________________  9.You should see your doctor in the office for a follow-up appointment approximately 2-3 weeks after your surgery.  Make sure that you call for this appointment within a day or two after you arrive home to insure a convenient appointment time. 10.OTHER INSTRUCTIONS: __OK TO SHOWER TODAY ICE PACK AND TYLENOL ALSO FOR PAIN NO LIFTING MORE THAN 15 TO 20 POUNDS FOR 6 WEEKS_______________________    _____________________________________  WHEN TO CALL YOUR DOCTOR: Fever over 101.0 Inability to urinate Nausea and/or vomiting Extreme swelling or bruising Continued bleeding from incision. Increased pain, redness, or drainage from the incision  The clinic staff is available to answer your questions during regular business hours.  Please don't hesitate to call and ask to speak to one of the nurses for clinical concerns.  If you have a medical emergency, go to the nearest emergency room or call 911.  A surgeon from [facility] is always on call at the hospital   [address] [place], [place], [place]  [address] ?  [address], [place], [place]   [address] [phone] ? [phone] ? FAX [phone] Web site: [email or web address]

## 2021-09-27 NOTE — Progress Notes (Signed)
Patient ID: Vincent Cantu, male   DOB: [birth year], 50 y.o.   MRN: [id number]   Doing well Pain controlled Abdomen soft  Plan:   Discharge home

## 2021-10-08 ENCOUNTER — Other Ambulatory Visit: Payer: Self-pay | Admitting: Family Medicine

## 2021-10-08 DIAGNOSIS — E118 Type 2 diabetes mellitus with unspecified complications: Secondary | ICD-10-CM

## 2021-10-08 DIAGNOSIS — E1159 Type 2 diabetes mellitus with other circulatory complications: Secondary | ICD-10-CM

## 2021-10-31 ENCOUNTER — Other Ambulatory Visit: Payer: Self-pay | Admitting: Family Medicine

## 2021-10-31 DIAGNOSIS — E1159 Type 2 diabetes mellitus with other circulatory complications: Secondary | ICD-10-CM

## 2021-10-31 DIAGNOSIS — I152 Hypertension secondary to endocrine disorders: Secondary | ICD-10-CM

## 2021-11-27 ENCOUNTER — Other Ambulatory Visit: Payer: Self-pay | Admitting: Family Medicine

## 2021-11-27 DIAGNOSIS — E1169 Type 2 diabetes mellitus with other specified complication: Secondary | ICD-10-CM

## 2021-12-17 ENCOUNTER — Other Ambulatory Visit: Payer: Self-pay | Admitting: Family Medicine

## 2022-01-24 LAB — HM DIABETES EYE EXAM

## 2022-02-15 ENCOUNTER — Encounter: Payer: Self-pay | Admitting: Family Medicine

## 2022-03-27 ENCOUNTER — Other Ambulatory Visit: Payer: Self-pay | Admitting: Family Medicine

## 2022-04-09 ENCOUNTER — Ambulatory Visit (INDEPENDENT_AMBULATORY_CARE_PROVIDER_SITE_OTHER): Payer: 59 | Admitting: Family Medicine

## 2022-04-09 ENCOUNTER — Encounter: Payer: Self-pay | Admitting: Family Medicine

## 2022-04-09 VITALS — BP 148/88 | HR 100 | Temp 97.3°F | Wt 323.8 lb

## 2022-04-09 DIAGNOSIS — R0609 Other forms of dyspnea: Secondary | ICD-10-CM | POA: Diagnosis not present

## 2022-04-09 NOTE — Progress Notes (Signed)
   Subjective:    Patient ID: Vincent Cantu, male    DOB: Dec 25, 1970, 51 y.o.   MRN: [id number]  HPI He states that earlier today while he was going up a flight of stairs he became short of breath but did not become diaphoretic or have an irregular heartbeat.  He did state that his heart rate was right around 110 when that occurred and he noted this on his smart watch.  After that he went for a walk which involves going up and down hills and he noted that his heart rate reached 115 but did not note any chest discomfort especially when going up hills.   Review of Systems     Objective:   Physical Exam Alert and in no distress.  Cardiac exam shows regular rhythm without murmurs or gallops.  Lungs are clear to auscultation. EKG read by me shows a heart rate of 82 with other parameters being normal.  QRS complexes did look like IVCD, possible early RBBB      Assessment & Plan:  Dyspnea on exertion - Plan: EKG 12-Lead I explained that since he did have 1 episode of dyspnea on exertion with another 1 with the same amount of activity, higher heart rate and no symptoms, watchful waiting was certainly not unreasonable.  If he has more symptoms of dyspnea on exertion I will certainly refer to cardiology.  He was comfortable.

## 2022-04-10 ENCOUNTER — Other Ambulatory Visit: Payer: Self-pay | Admitting: Family Medicine

## 2022-04-10 ENCOUNTER — Encounter: Payer: Self-pay | Admitting: Family Medicine

## 2022-04-10 DIAGNOSIS — E1159 Type 2 diabetes mellitus with other circulatory complications: Secondary | ICD-10-CM

## 2022-04-18 ENCOUNTER — Other Ambulatory Visit: Payer: Self-pay | Admitting: Family Medicine

## 2022-04-18 DIAGNOSIS — E1169 Type 2 diabetes mellitus with other specified complication: Secondary | ICD-10-CM

## 2022-04-18 DIAGNOSIS — I152 Hypertension secondary to endocrine disorders: Secondary | ICD-10-CM

## 2022-04-22 ENCOUNTER — Other Ambulatory Visit: Payer: Self-pay | Admitting: Family Medicine

## 2022-04-22 DIAGNOSIS — E1159 Type 2 diabetes mellitus with other circulatory complications: Secondary | ICD-10-CM

## 2022-04-24 ENCOUNTER — Other Ambulatory Visit: Payer: Self-pay | Admitting: Family Medicine

## 2022-04-24 DIAGNOSIS — E1159 Type 2 diabetes mellitus with other circulatory complications: Secondary | ICD-10-CM

## 2022-05-22 ENCOUNTER — Telehealth: Payer: Self-pay | Admitting: Family Medicine

## 2022-05-22 NOTE — Telephone Encounter (Signed)
Pt calling in requesting a referral to a podiatrist.

## 2022-05-24 NOTE — Telephone Encounter (Signed)
Pt has upcoming appointment 

## 2022-05-24 NOTE — Telephone Encounter (Signed)
He went to Urgent care in [place] (in care everywhere) and had xrays and everything done and was told he had a bone spur and heel spur and recommended foot doctor here. Does he still need to come in if records are in care everywhere

## 2022-05-28 ENCOUNTER — Encounter: Payer: Self-pay | Admitting: Family Medicine

## 2022-05-28 ENCOUNTER — Ambulatory Visit (INDEPENDENT_AMBULATORY_CARE_PROVIDER_SITE_OTHER): Payer: 59 | Admitting: Family Medicine

## 2022-05-28 VITALS — BP 138/98 | HR 80 | Temp 96.8°F | Wt 323.4 lb

## 2022-05-28 DIAGNOSIS — M79671 Pain in right foot: Secondary | ICD-10-CM | POA: Diagnosis not present

## 2022-05-28 DIAGNOSIS — N529 Male erectile dysfunction, unspecified: Secondary | ICD-10-CM | POA: Diagnosis not present

## 2022-05-28 MED ORDER — SILDENAFIL CITRATE 100 MG PO TABS: 50.0000 mg | ORAL_TABLET | Freq: Every day | ORAL | 11 refills | Status: DC | PRN

## 2022-05-28 NOTE — Progress Notes (Signed)
   Subjective:    Patient ID: Vincent Cantu, male    DOB: [birth year], 51 y.o.   MRN: [id number]  HPI He is here to discuss foot pain.  This started 2 or 3 weeks ago after he did a lot of walking, much more than he normally does.  The pain came after walking.  When he gets up in the morning he has minimal discomfort but progresses as the days goes on.  He was seen in an urgent care center and told he had a bone spur.  He then went out and got an orthotic and states that the orthotic actually did help with his pain. He then asked questions concerning erectile dysfunction and wanting to be placed on a medication for that.  He had tried Cialis in the past and found it to not be quite as effective as he would like.  He has had difficulty with this in the past and started to notice more trouble. He also is on [name] and is disappointed that it has not helped with weight loss.   Review of Systems     Objective:   Physical Exam Alert and in no distress.  Exam of the foot does show that he has bilateral pes planus.  No tenderness over the rectal calcaneal area or the Achilles tendon.  No tenderness over the calcaneal spur.  Full motion of the foot.       Assessment & Plan:  Right foot pain  Erectile dysfunction, unspecified erectile dysfunction type - Plan: sildenafil (VIAGRA) 100 MG tablet  Morbid obesity ([place]) Recommend he continue to use the orthotic but try to find 1 that fits his shoe.  Discussed taking out the insert in his shoe and just using the orthotic and see if this helps better.  Discussed possible referral to podiatry if this is not enough for a more advanced type of orthotic. Discussed switching him to Viagra and the proper use of this. I then discussed his weight in the [facility].  I explained that the lack of weight loss could easily be the fact that he has made some dietary changes and with the help of the [facility] has not gained any weight.  Explained that we will rediscuss  this when he comes in for his next exam.

## 2022-06-03 ENCOUNTER — Telehealth: Payer: Self-pay | Admitting: Family Medicine

## 2022-06-03 NOTE — Telephone Encounter (Signed)
Pt called & states his foot it no better, he is now having to use crutches due to the pain.  He would like referral to any podiatrist as soon as possible.

## 2022-06-04 ENCOUNTER — Other Ambulatory Visit: Payer: Self-pay

## 2022-06-04 DIAGNOSIS — M79671 Pain in right foot: Secondary | ICD-10-CM

## 2022-06-04 NOTE — Telephone Encounter (Signed)
Pt was advised Vincent Cantu 

## 2022-06-04 NOTE — Telephone Encounter (Signed)
Order place urgent. kh

## 2022-06-13 ENCOUNTER — Encounter: Payer: Self-pay | Admitting: Podiatry

## 2022-06-13 ENCOUNTER — Ambulatory Visit (INDEPENDENT_AMBULATORY_CARE_PROVIDER_SITE_OTHER): Payer: Managed Care, Other (non HMO) | Admitting: Podiatry

## 2022-06-13 ENCOUNTER — Other Ambulatory Visit: Payer: Self-pay | Admitting: Podiatry

## 2022-06-13 ENCOUNTER — Ambulatory Visit (INDEPENDENT_AMBULATORY_CARE_PROVIDER_SITE_OTHER): Payer: Managed Care, Other (non HMO)

## 2022-06-13 DIAGNOSIS — Q666 Other congenital valgus deformities of feet: Secondary | ICD-10-CM | POA: Diagnosis not present

## 2022-06-13 DIAGNOSIS — M76821 Posterior tibial tendinitis, right leg: Secondary | ICD-10-CM

## 2022-06-13 DIAGNOSIS — L603 Nail dystrophy: Secondary | ICD-10-CM | POA: Diagnosis not present

## 2022-06-13 DIAGNOSIS — M778 Other enthesopathies, not elsewhere classified: Secondary | ICD-10-CM

## 2022-06-13 DIAGNOSIS — K529 Noninfective gastroenteritis and colitis, unspecified: Secondary | ICD-10-CM | POA: Insufficient documentation

## 2022-06-13 DIAGNOSIS — M722 Plantar fascial fibromatosis: Secondary | ICD-10-CM

## 2022-06-13 MED ORDER — MELOXICAM 15 MG PO TABS: 15.0000 mg | ORAL_TABLET | Freq: Every day | ORAL | 3 refills | Status: AC

## 2022-06-13 MED ORDER — TRIAMCINOLONE ACETONIDE 40 MG/ML IJ SUSP
20.0000 mg | Freq: Once | INTRAMUSCULAR | Status: AC
  Administered 2022-06-13: 20 mg

## 2022-06-13 NOTE — Progress Notes (Addendum)
Subjective:  Patient ID: Vincent Cantu, male    DOB: 22-Oct-1971,  MRN: [id number] HPI Chief Complaint  Patient presents with   Foot Pain    Dorsal midfoot/ankle right - aching, swelling x couple months, [place] trip couple weeks ago-got so bad had to see Urgent Care there - xrayed, said bone spurs, using voltaren gel and Ibuprofen, flat feet   New Patient (Initial Visit)    51 y.o. male presents with the above complaint.   ROS: Denies fever chills nausea vomiting muscle aches pains calf pain back pain chest pain shortness of breath.  States that mowing the yard really kills this right foot.  Past Medical History:  Diagnosis Date   Back pain    Chest pain    Complication of anesthesia    Per pt's wife trouble waking after previous surgery   Diabetes mellitus without complication ([place]) 37/6283   Hyperlipidemia    Hypertension    Past Surgical History:  Procedure Laterality Date   arthroscopic to knee for meniscus tear     INCISIONAL HERNIA REPAIR N/A 09/26/2021   Procedure: LAPAROSCOPIC  INCISIONAL HERNIA REPAIR;  Surgeon: Coralie Keens, MD;  Location: [facility];  Service: General;  Laterality: N/A;   INSERTION OF MESH N/A 09/26/2021   Procedure: INSERTION OF MESH;  Surgeon: Coralie Keens, MD;  Location: [facility];  Service: General;  Laterality: N/A;   LUMBAR FUSION     LUMBAR LAMINECTOMY     umbilical hernia repair with mesh x 2     VENTRAL HERNIA REPAIR N/A 08/28/2014   Procedure: HERNIA REPAIR VENTRAL ADULT;  Surgeon: Doreen Salvage, MD;  Location: [facility];  Service: General;  Laterality: N/A;    Current Outpatient Medications:    meloxicam (MOBIC) 15 MG tablet, Take 1 tablet (15 mg total) by mouth daily., Disp: 30 tablet, Rfl: 3   amLODipine (NORVASC) 10 MG tablet, TAKE 1 TABLET BY MOUTH EVERY DAY, Disp: 90 tablet, Rfl: 1   atorvastatin (LIPITOR) 20 MG tablet, TAKE 1 TABLET BY MOUTH EVERY DAY, Disp: 90 tablet, Rfl: 0   Blood Glucose Monitoring Suppl DEVI, Pt needs a one touch  ultra meter, Disp: 1 each, Rfl: 0   cetirizine (ZYRTEC) 10 MG tablet, Take 10 mg by mouth at bedtime., Disp: , Rfl:    Cholecalciferol (VITAMIN D3) 125 MCG (5000 UT) CAPS, Take 5,000 Units by mouth daily. (Patient not taking: Reported on 04/09/2022), Disp: , Rfl:    EDARBYCLOR 40-25 MG TABS, TAKE 1 TABLET BY MOUTH ONCE DAILY, Disp: 90 tablet, Rfl: 0   glucose blood test strip, Test once a day. Uses one touch ultra meter, Disp: 100 each, Rfl: 5   Melatonin 10 MG TABS, Take 10 mg by mouth at bedtime., Disp: , Rfl:    metFORMIN (GLUCOPHAGE) 1000 MG tablet, TAKE 1 TABLET BY MOUTH TWICE A DAY, Disp: 180 tablet, Rfl: 1   metoprolol (TOPROL-XL) 200 MG 24 hr tablet, TAKE 1 TABLET BY MOUTH EVERY DAY, Disp: 90 tablet, Rfl: 0   MOUNJARO 5 MG/0.5ML Pen, INJECT 5 MG SUBCUTANEOUSLY WEEKLY, Disp: 25 mL, Rfl: 1   ONETOUCH DELICA LANCETS FINE MISC, Test once a day, Disp: 100 each, Rfl: 5   OVER THE COUNTER MEDICATION, Take 1 capsule by mouth daily. Sea Moss with Beet Root, Disp: , Rfl:    sildenafil (VIAGRA) 100 MG tablet, Take 0.5-1 tablets (50-100 mg total) by mouth daily as needed for erectile dysfunction., Disp: 5 tablet, Rfl: 11   terazosin (HYTRIN) 2  MG capsule, TAKE 1 CAPSULE BY MOUTH EVERYDAY AT BEDTIME, Disp: 90 capsule, Rfl: 0   traMADol (ULTRAM) 50 MG tablet, Take 1-2 tablets (50-100 mg total) by mouth every 6 (six) hours as needed. (Patient not taking: Reported on 04/09/2022), Disp: 25 tablet, Rfl: 0  No Known Allergies Review of Systems Objective:  There were no vitals filed for this visit.  General: Well developed, nourished, in no acute distress, alert and oriented x3   Dermatological: Skin is warm, dry and supple bilateral. Nails x 10 are well maintained; he does have a couple of nails that are thick and dystrophic he also has Malan onychia remaining integument appears unremarkable at this time. There are no open sores, no preulcerative lesions, no rash or signs of infection present.  Vascular:  Dorsalis Pedis artery and Posterior Tibial artery pedal pulses are 2/4 bilateral with immedate capillary fill time. Pedal hair growth present. No varicosities and no lower extremity edema present bilateral.   Neruologic: Grossly intact via light touch bilateral. Vibratory intact via tuning fork bilateral. Protective threshold with Semmes Wienstein monofilament intact to all pedal sites bilateral. Patellar and Achilles deep tendon reflexes 2+ bilateral. No Babinski or clonus noted bilateral.   Musculoskeletal: No gross boney pedal deformities bilateral. No pain, crepitus, or limitation noted with foot and ankle range of motion bilateral. Muscular strength 5/5 in all groups tested bilateral.  Pes planovalgus of the right foot with pain on palpation medial calcaneal tubercle pain on palpation of the fourth fifth tarsometatarsal joint and of the posterior tibial tendon.  Gait: Unassisted, Nonantalgic.    Radiographs:  Radiographs taken today demonstrate moderate to severe pes planovalgus with small posterior calcaneal heel spur and some calcification within the plantar fascia proximally.  There are some swelling and soft tissue on the AP view along the medial aspect of the foot as well as no fractures of the navicular identified no acute findings are noted.  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  Nail dystrophy  Plan: Discussed etiology pathology and surgical therapies put him on meloxicam 15 mg 1 p.o. daily injected the right heel 20 mg Kenalog 5 mg Marcaine discussed appropriate shoe gear stretching exercises ice therapy and shoe modifications.  We did discuss the probability of having to get him into orthotics.  I do believe he is amendable to this if necessary.  Samples of the skin and nail were taken today to be sent for pathologic evaluation we will follow-up with him in 1 month for  results     Quintell Bonnin T. [place], [place]

## 2022-06-21 ENCOUNTER — Other Ambulatory Visit: Payer: Self-pay | Admitting: Family Medicine

## 2022-06-25 ENCOUNTER — Encounter: Payer: Self-pay | Admitting: Family Medicine

## 2022-06-25 ENCOUNTER — Ambulatory Visit (INDEPENDENT_AMBULATORY_CARE_PROVIDER_SITE_OTHER): Payer: Managed Care, Other (non HMO) | Admitting: Family Medicine

## 2022-06-25 VITALS — BP 128/80 | HR 88 | Temp 96.0°F | Ht 73.5 in | Wt 314.4 lb

## 2022-06-25 DIAGNOSIS — N529 Male erectile dysfunction, unspecified: Secondary | ICD-10-CM

## 2022-06-25 DIAGNOSIS — E785 Hyperlipidemia, unspecified: Secondary | ICD-10-CM

## 2022-06-25 DIAGNOSIS — N183 Chronic kidney disease, stage 3 unspecified: Secondary | ICD-10-CM | POA: Diagnosis not present

## 2022-06-25 DIAGNOSIS — D126 Benign neoplasm of colon, unspecified: Secondary | ICD-10-CM

## 2022-06-25 DIAGNOSIS — E1159 Type 2 diabetes mellitus with other circulatory complications: Secondary | ICD-10-CM | POA: Diagnosis not present

## 2022-06-25 DIAGNOSIS — E782 Mixed hyperlipidemia: Secondary | ICD-10-CM

## 2022-06-25 DIAGNOSIS — J309 Allergic rhinitis, unspecified: Secondary | ICD-10-CM

## 2022-06-25 DIAGNOSIS — E1169 Type 2 diabetes mellitus with other specified complication: Secondary | ICD-10-CM | POA: Diagnosis not present

## 2022-06-25 DIAGNOSIS — I152 Hypertension secondary to endocrine disorders: Secondary | ICD-10-CM

## 2022-06-25 DIAGNOSIS — G5711 Meralgia paresthetica, right lower limb: Secondary | ICD-10-CM

## 2022-06-25 DIAGNOSIS — M25562 Pain in left knee: Secondary | ICD-10-CM

## 2022-06-25 DIAGNOSIS — Z0000 Encounter for general adult medical examination without abnormal findings: Secondary | ICD-10-CM | POA: Diagnosis not present

## 2022-06-25 DIAGNOSIS — G8929 Other chronic pain: Secondary | ICD-10-CM

## 2022-06-25 DIAGNOSIS — M1990 Unspecified osteoarthritis, unspecified site: Secondary | ICD-10-CM

## 2022-06-25 DIAGNOSIS — E118 Type 2 diabetes mellitus with unspecified complications: Secondary | ICD-10-CM

## 2022-06-25 DIAGNOSIS — E1122 Type 2 diabetes mellitus with diabetic chronic kidney disease: Secondary | ICD-10-CM | POA: Diagnosis not present

## 2022-06-25 LAB — POCT GLYCOSYLATED HEMOGLOBIN (HGB A1C): Hemoglobin A1C: 6.8 % — AB (ref 4.0–5.6)

## 2022-06-25 LAB — POCT UA - MICROALBUMIN
Albumin/Creatinine Ratio, Urine, POC: <3.1
Creatinine, POC: 163.7 mg/dL
Microalbumin Ur, POC: <5 mg/L

## 2022-06-25 MED ORDER — TERAZOSIN HCL 2 MG PO CAPS: ORAL_CAPSULE | ORAL | 3 refills | Status: DC

## 2022-06-25 MED ORDER — METFORMIN HCL 1000 MG PO TABS: 1000.0000 mg | ORAL_TABLET | Freq: Two times a day (BID) | ORAL | 1 refills | Status: DC

## 2022-06-25 MED ORDER — TIRZEPATIDE 7.5 MG/0.5ML ~~LOC~~ SOAJ: 7.5000 mg | SUBCUTANEOUS | 1 refills | Status: DC

## 2022-06-25 MED ORDER — ATORVASTATIN CALCIUM 20 MG PO TABS: 20.0000 mg | ORAL_TABLET | Freq: Every day | ORAL | 3 refills | Status: DC

## 2022-06-25 MED ORDER — EDARBYCLOR 40-25 MG PO TABS: 1.0000 | ORAL_TABLET | Freq: Every day | ORAL | 3 refills | Status: DC

## 2022-06-25 MED ORDER — METOPROLOL SUCCINATE ER 200 MG PO TB24: 200.0000 mg | ORAL_TABLET | Freq: Every day | ORAL | 3 refills | Status: DC

## 2022-06-25 MED ORDER — TADALAFIL 20 MG PO TABS: 20.0000 mg | ORAL_TABLET | Freq: Every day | ORAL | 5 refills | Status: AC | PRN

## 2022-06-25 MED ORDER — AMLODIPINE BESYLATE 10 MG PO TABS: 10.0000 mg | ORAL_TABLET | Freq: Every day | ORAL | 3 refills | Status: DC

## 2022-06-25 NOTE — Patient Instructions (Signed)
Health Maintenance, Male Adopting a healthy lifestyle and getting preventive care are important in promoting health and wellness. Ask your health care provider about: The right schedule for you to have regular tests and exams. Things you can do on your own to prevent diseases and keep yourself healthy. What should I know about diet, weight, and exercise? Eat a healthy diet  Eat a diet that includes plenty of vegetables, fruits, low-fat dairy products, and lean protein. Do not eat a lot of foods that are high in solid fats, added sugars, or sodium. Maintain a healthy weight Body mass index (BMI) is a measurement that can be used to identify possible weight problems. It estimates body fat based on height and weight. Your health care provider can help determine your BMI and help you achieve or maintain a healthy weight. Get regular exercise Get regular exercise. This is one of the most important things you can do for your health. Most adults should: Exercise for at least 150 minutes each week. The exercise should increase your heart rate and make you sweat (moderate-intensity exercise). Do strengthening exercises at least twice a week. This is in addition to the moderate-intensity exercise. Spend less time sitting. Even light physical activity can be beneficial. Watch cholesterol and blood lipids Have your blood tested for lipids and cholesterol at 53 years of age, then have this test every 5 years. You may need to have your cholesterol levels checked more often if: Your lipid or cholesterol levels are high. You are older than 53 years of age. You are at high risk for heart disease. What should I know about cancer screening? Many types of cancers can be detected early and may often be prevented. Depending on your health history and family history, you may need to have cancer screening at various ages. This may include screening for: Colorectal cancer. Prostate cancer. Skin cancer. Lung  cancer. What should I know about heart disease, diabetes, and high blood pressure? Blood pressure and heart disease High blood pressure causes heart disease and increases the risk of stroke. This is more likely to develop in people who have high blood pressure readings or are overweight. Talk with your health care provider about your target blood pressure readings. Have your blood pressure checked: Every 3-5 years if you are 18-39 years of age. Every year if you are 40 years old or older. If you are between the ages of 65 and 75 and are a current or former smoker, ask your health care provider if you should have a one-time screening for abdominal aortic aneurysm (AAA). Diabetes Have regular diabetes screenings. This checks your fasting blood sugar level. Have the screening done: Once every three years after age 45 if you are at a normal weight and have a low risk for diabetes. More often and at a younger age if you are overweight or have a high risk for diabetes. What should I know about preventing infection? Hepatitis B If you have a higher risk for hepatitis B, you should be screened for this virus. Talk with your health care provider to find out if you are at risk for hepatitis B infection. Hepatitis C Blood testing is recommended for: Everyone born from 1945 through 1965. Anyone with known risk factors for hepatitis C. Sexually transmitted infections (STIs) You should be screened each year for STIs, including gonorrhea and chlamydia, if: You are sexually active and are younger than 53 years of age. You are older than 53 years of age and your   health care provider tells you that you are at risk for this type of infection. Your sexual activity has changed since you were last screened, and you are at increased risk for chlamydia or gonorrhea. Ask your health care provider if you are at risk. Ask your health care provider about whether you are at high risk for HIV. Your health care provider  may recommend a prescription medicine to help prevent HIV infection. If you choose to take medicine to prevent HIV, you should first get tested for HIV. You should then be tested every 3 months for as long as you are taking the medicine. Follow these instructions at home: Alcohol use Do not drink alcohol if your health care provider tells you not to drink. If you drink alcohol: Limit how much you have to 0-2 drinks a day. Know how much alcohol is in your drink. In the [place] one drink equals one 12 oz bottle of beer (355 mL), one 5 oz glass of wine (148 mL), or one 1 oz glass of hard liquor (44 mL). Lifestyle Do not use any products that contain nicotine or tobacco. These products include cigarettes, chewing tobacco, and vaping devices, such as e-cigarettes. If you need help quitting, ask your health care provider. Do not use street drugs. Do not share needles. Ask your health care provider for help if you need support or information about quitting drugs. General instructions Schedule regular health, dental, and eye exams. Stay current with your vaccines. Tell your health care provider if: You often feel depressed. You have ever been abused or do not feel safe at home. Summary Adopting a healthy lifestyle and getting preventive care are important in promoting health and wellness. Follow your health care provider's instructions about healthy diet, exercising, and getting tested or screened for diseases. Follow your health care provider's instructions on monitoring your cholesterol and blood pressure. This information is not intended to replace advice given to you by your health care provider. Make sure you discuss any questions you have with your health care provider. Document Revised: 03/26/2021 Document Reviewed: 03/26/2021 Elsevier Patient Education  2023 Elsevier Inc.  

## 2022-06-25 NOTE — Progress Notes (Signed)
Complete physical exam  Patient: Vincent Cantu   DOB: 1971/01/01   51 y.o. Male  MRN: [id number]  Subjective:      Vincent Cantu is a 51 y.o. male who presents today for a complete physical exam. He reports consuming a diabetic, 1500 calorie diet.  Staying active at least 20 minutes a day.   He generally feels well. He reports sleeping well. He does have additional problems to discuss today.  He had hernia surgery in November and recovered fairly well from this.  He has also had some right foot pain and did get an injection which has helped the pain.  He complains of intermittent right hip tingling that goes away fairly quickly and does not go past the mid thigh area.  He has a previous history of multiple shoulder dislocations and does occasionally have left shoulder discomfort but it does not last for long period of time.  He also complains of bilateral axillary pain that again is very intermittent in nature and he cannot relate this to any particular activities.  He has also had some intermittent left knee pain.  Does have a previous history of adenomatous colonic polyp but his most recent colonoscopy was apparently negative.  He is followed by Dr. Benson Norway for that.  He continues on multiple blood pressure medications including Edarbychlor, metoprolol, Terrazas and and amlodipine.  He is having no difficulty with him.  He does take Mounjaro as well as metformin for his diabetes.  He has noted a slight decrease in his weight.  His allergies seem to be under good control.  He does state that he does not think the sildenafil is working well for him.  Family and social history as well as health maintenance and immunizations was reviewed.   Most recent fall risk assessment:    07/10/2016    3:11 PM  [place] in the past year? No     Most recent depression screenings:    06/19/2021    1:40 PM 05/03/2021    2:26 PM  PHQ 2/9 Scores  PHQ - 2 Score 0 0      Patient Active Problem List    Diagnosis Date Noted   Incisional hernia 09/26/2021   History of COVID-19 06/19/2021   Chronic pain of left knee 12/01/2020   Adenomatous polyp of colon 01/21/2019   Arthritis 03/24/2018   Type 2 diabetes with complication ([place]) 40/98/1191   Allergic rhinitis 06/22/2013   Morbid obesity ([place]) 06/22/2013   Hyperlipidemia associated with type 2 diabetes mellitus ([place]) 06/11/2007   Hypertension associated with diabetes ([place]) 06/11/2007   Past Medical History:  Diagnosis Date   Back pain    Chest pain    Complication of anesthesia    Per pt's wife trouble waking after previous surgery   Diabetes mellitus without complication ([place]) 47/8295   Hyperlipidemia    Hypertension    Past Surgical History:  Procedure Laterality Date   arthroscopic to knee for meniscus tear     INCISIONAL HERNIA REPAIR N/A 09/26/2021   Procedure: LAPAROSCOPIC  INCISIONAL HERNIA REPAIR;  Surgeon: Coralie Keens, MD;  Location: [facility];  Service: General;  Laterality: N/A;   INSERTION OF MESH N/A 09/26/2021   Procedure: INSERTION OF MESH;  Surgeon: Coralie Keens, MD;  Location: [facility];  Service: General;  Laterality: N/A;   LUMBAR FUSION     LUMBAR LAMINECTOMY     umbilical hernia repair with mesh x 2  VENTRAL HERNIA REPAIR N/A 08/28/2014   Procedure: HERNIA REPAIR VENTRAL ADULT;  Surgeon: Doreen Salvage, MD;  Location: [facility];  Service: General;  Laterality: N/A;   Social History   Tobacco Use   Smoking status: Never   Smokeless tobacco: Never  Vaping Use   Vaping Use: Never used  Substance Use Topics   Alcohol use: No    Alcohol/week: 0.0 standard drinks of alcohol   Drug use: No   Family History  Problem Relation Age of Onset   Diabetes Mother    Hypertension Mother    Hypertension Father    Alcohol abuse Father    Hypertension Other    Seizures Other    Diabetes Other        with renal dz   Diabetes Maternal Grandmother    No Known Allergies    Patient Care Team: Denita Lung,  MD as PCP - General (Family Medicine) Edythe Clarity, MD as Surgeon (Orthopedic Surgery)   Outpatient Medications Prior to Visit  Medication Sig   amLODipine (NORVASC) 10 MG tablet TAKE 1 TABLET BY MOUTH EVERY DAY   atorvastatin (LIPITOR) 20 MG tablet TAKE 1 TABLET BY MOUTH EVERY DAY   Blood Glucose Monitoring Suppl DEVI Pt needs a one touch ultra meter   cetirizine (ZYRTEC) 10 MG tablet Take 10 mg by mouth at bedtime.   Cholecalciferol (VITAMIN D3) 125 MCG (5000 UT) CAPS Take 5,000 Units by mouth daily. (Patient not taking: Reported on 04/09/2022)   EDARBYCLOR 40-25 MG TABS TAKE 1 TABLET BY MOUTH ONCE DAILY   glucose blood test strip Test once a day. Uses one touch ultra meter   Melatonin 10 MG TABS Take 10 mg by mouth at bedtime.   meloxicam (MOBIC) 15 MG tablet Take 1 tablet (15 mg total) by mouth daily.   metFORMIN (GLUCOPHAGE) 1000 MG tablet TAKE 1 TABLET BY MOUTH TWICE A DAY   metoprolol (TOPROL-XL) 200 MG 24 hr tablet TAKE 1 TABLET BY MOUTH EVERY DAY   MOUNJARO 5 MG/0.5ML Pen INJECT 5 MG SUBCUTANEOUSLY WEEKLY   ONETOUCH DELICA LANCETS FINE MISC Test once a day   OVER THE COUNTER MEDICATION Take 1 capsule by mouth daily. Sea Moss with Beet Root   sildenafil (VIAGRA) 100 MG tablet Take 0.5-1 tablets (50-100 mg total) by mouth daily as needed for erectile dysfunction.   terazosin (HYTRIN) 2 MG capsule TAKE 1 CAPSULE BY MOUTH EVERYDAY AT BEDTIME   traMADol (ULTRAM) 50 MG tablet Take 1-2 tablets (50-100 mg total) by mouth every 6 (six) hours as needed. (Patient not taking: Reported on 04/09/2022)   No facility-administered medications prior to visit.    Review of Systems  All other systems reviewed and are negative.         Objective:     There were no vitals taken for this visit. BP Readings from Last 3 Encounters:  05/28/22 (!) 138/98  04/09/22 (!) 148/88  09/27/21 125/88   Wt Readings from Last 3 Encounters:  05/28/22 (!) 323 lb 6.4 oz (146.7 kg)  04/09/22 (!) 323 lb  12.8 oz (146.9 kg)  09/26/21 (!) 321 lb (145.6 kg)      Physical Exam  Alert and in no distress. Tympanic membranes and canals are normal. Pharyngeal area is normal. Neck is supple without adenopathy or thyromegaly. Cardiac exam shows a regular sinus rhythm without murmurs or gallops. Lungs are clear to auscultation.  Abdominal exam difficult to do due to his size but no masses or tenderness  was noted.  He did have a foot exam by the foot specialist.   Last CBC Lab Results  Component Value Date   WBC 13.7 (H) 09/27/2021   HGB 13.1 09/27/2021   HCT 40.5 09/27/2021   MCV 82.0 09/27/2021   MCH 26.5 09/27/2021   RDW 13.3 09/27/2021   PLT 226 23/55/7322   Last metabolic panel Lab Results  Component Value Date   GLUCOSE 116 (H) 09/27/2021   NA 138 09/27/2021   K 3.6 09/27/2021   CL 104 09/27/2021   CO2 26 09/27/2021   BUN 14 09/27/2021   CREATININE 1.52 (H) 09/27/2021   GFRNONAA 55 (L) 09/27/2021   CALCIUM 9.0 09/27/2021   PROT 7.9 06/19/2021   ALBUMIN 4.9 06/19/2021   LABGLOB 3.0 06/19/2021   AGRATIO 1.6 06/19/2021   BILITOT 0.7 06/19/2021   ALKPHOS 91 06/19/2021   AST 50 (H) 06/19/2021   ALT 43 06/19/2021   ANIONGAP 8 09/27/2021   Last lipids Lab Results  Component Value Date   CHOL 130 06/19/2021   HDL 31 (L) 06/19/2021   LDLCALC 79 06/19/2021   LDLDIRECT 103.5 01/20/2012   TRIG 110 06/19/2021   CHOLHDL 4.2 06/19/2021   Last hemoglobin A1c Lab Results  Component Value Date   HGBA1C 6.4 (H) 09/19/2021   Last thyroid functions Lab Results  Component Value Date   TSH 1.720 12/13/2019   T3TOTAL 105 12/13/2019   Last vitamin D Lab Results  Component Value Date   VD25OH 45.9 03/23/2020   Last vitamin B12 and Folate Lab Results  Component Value Date   VITAMINB12 239 12/13/2019   FOLATE 18.7 12/13/2019        The 10-year ASCVD risk score (Arnett DK, et al., 2019) is: 21.1%   Values used to calculate the score:     Age: 42 years     Sex: Male     Is  Non-Hispanic African American: Yes     Diabetic: Yes     Tobacco smoker: No     Systolic Blood Pressure: 025 mmHg     Is BP treated: Yes     HDL Cholesterol: 31 mg/dL     Total Cholesterol: 130 mg/dL Hemoglobin A1c is 6.8 Assessment & Plan:    Routine general medical examination at a health care facility  Hypertension associated with diabetes ([place]) - Plan: CBC with Differential/Platelet, Comprehensive metabolic panel, terazosin (HYTRIN) 2 MG capsule, EDARBYCLOR 40-25 MG TABS, metoprolol (TOPROL-XL) 200 MG 24 hr tablet, amLODipine (NORVASC) 10 MG tablet  Allergic rhinitis, unspecified seasonality, unspecified trigger  Adenomatous polyp of colon, unspecified part of colon  Hyperlipidemia associated with type 2 diabetes mellitus ([place]) - Plan: Lipid panel  Type 2 diabetes with complication ([place]) - Plan: POCT glycosylated hemoglobin (Hb A1C), CBC with Differential/Platelet, Comprehensive metabolic panel, Lipid panel, POCT UA - Microalbumin, tirzepatide (MOUNJARO) 7.5 MG/0.5ML Pen, metFORMIN (GLUCOPHAGE) 1000 MG tablet  Arthritis  Morbid obesity ([place]) - Plan: CBC with Differential/Platelet, Comprehensive metabolic panel, Lipid panel  Erectile dysfunction, unspecified erectile dysfunction type - Plan: tadalafil (CIALIS) 20 MG tablet  Meralgia paresthetica of right side  Chronic pain of left knee  Mixed hyperlipidemia due to type 2 diabetes mellitus ([place]) - Plan: atorvastatin (LIPITOR) 20 MG tablet  Immunization History  Administered Date(s) Administered   Influenza Split 09/19/2011   Influenza Whole 08/14/2009, 11/20/2010, 08/29/2013   Influenza, Seasonal, Injecte, Preservative Fre 08/26/2013   Influenza,inj,Quad PF,6+ Mos 08/23/2014, 07/25/2015, 07/10/2016, 07/30/2017, 09/22/2018, 09/16/2019, 08/29/2020, 09/21/2021   PFIZER(Purple Top)SARS-COV-2  Vaccination 03/19/2020, 04/09/2020, 10/24/2020   Pfizer Covid-19 Vaccine Bivalent Booster 25yr & up 09/21/2021   Pneumococcal  Conjugate-13 07/30/2017   Pneumococcal Polysaccharide-23 08/31/2014   Td 11/18/2002   Tdap 08/23/2014    Health Maintenance  Topic Date Due   Zoster Vaccines- Shingrix (1 of 2) Never done   HEMOGLOBIN A1C  03/19/2022   INFLUENZA VACCINE  06/18/2022   FOOT EXAM  06/19/2022   HIV Screening  05/29/2023 (Originally 01/14/1986)   OPHTHALMOLOGY EXAM  01/25/2023   TETANUS/TDAP  08/23/2024   COLONOSCOPY (Pts 45-491yrInsurance coverage will need to be confirmed)  07/20/2031   COVID-19 Vaccine  Completed   Hepatitis C Screening  Completed   HPV VACCINES  Aged Out    Discussed health benefits of physical activity, and encouraged him to engage in regular exercise appropriate for his age and condition.  Problem List Items Addressed This Visit       Cardiovascular and Mediastinum   Hypertension associated with diabetes ([facility]     Respiratory   Allergic rhinitis     Digestive   Adenomatous polyp of colon     Endocrine   Hyperlipidemia associated with type 2 diabetes mellitus ([facility]  Type 2 diabetes with complication ([facility]    Musculoskeletal and Integument   Arthritis     Other   Morbid obesity ([facility] [place]  Other Visit Diagnoses     Erectile dysfunction, unspecified erectile dysfunction type          He will continue on his present medication regimen and I will increase the Mounjaro.  Discussed the meralgia paresthetica with him explaining it is a benign condition.  I then discussed the various aches and pains that he is having and since most of these are intermittent in nature, he is to keep track of them especially if they get worse in terms of what, when, where, why.  He understands this.  He will follow up with Dr. HuBenson Norway He is to call me in roughly 1 or 2 months [facility] We will let him know about side effects and how much weight he has potentially lost.  He was comfortable with all that.    KiElyse JarvisRMA

## 2022-06-26 ENCOUNTER — Encounter (INDEPENDENT_AMBULATORY_CARE_PROVIDER_SITE_OTHER): Payer: Self-pay

## 2022-06-26 ENCOUNTER — Telehealth: Payer: Self-pay | Admitting: Family Medicine

## 2022-06-26 DIAGNOSIS — E1122 Type 2 diabetes mellitus with diabetic chronic kidney disease: Secondary | ICD-10-CM | POA: Insufficient documentation

## 2022-06-26 HISTORY — DX: Type 2 diabetes mellitus with diabetic chronic kidney disease: E11.22

## 2022-06-26 LAB — CBC WITH DIFFERENTIAL/PLATELET
Basophils Absolute: 0 10*3/uL (ref 0.0–0.2)
Basos: 0 %
EOS (ABSOLUTE): 0.1 10*3/uL (ref 0.0–0.4)
Eos: 1 %
Hematocrit: 40 % (ref 37.5–51.0)
Hemoglobin: 13.1 g/dL (ref 13.0–17.7)
Immature Grans (Abs): 0 10*3/uL (ref 0.0–0.1)
Immature Granulocytes: 0 %
Lymphocytes Absolute: 3.2 10*3/uL — ABNORMAL HIGH (ref 0.7–3.1)
Lymphs: 44 %
MCH: 26 pg — ABNORMAL LOW (ref 26.6–33.0)
MCHC: 32.8 g/dL (ref 31.5–35.7)
MCV: 79 fL (ref 79–97)
Monocytes Absolute: 0.7 10*3/uL (ref 0.1–0.9)
Monocytes: 9 %
Neutrophils Absolute: 3.3 10*3/uL (ref 1.4–7.0)
Neutrophils: 46 %
Platelets: 291 10*3/uL (ref 150–450)
RBC: 5.04 x10E6/uL (ref 4.14–5.80)
RDW: 13.3 % (ref 11.6–15.4)
WBC: 7.3 10*3/uL (ref 3.4–10.8)

## 2022-06-26 LAB — COMPREHENSIVE METABOLIC PANEL
ALT: 20 IU/L (ref 0–44)
AST: 17 IU/L (ref 0–40)
Albumin/Globulin Ratio: 1.4 (ref 1.2–2.2)
Albumin: 4.5 g/dL (ref 3.8–4.9)
Alkaline Phosphatase: 122 IU/L — ABNORMAL HIGH (ref 44–121)
BUN/Creatinine Ratio: 15 (ref 9–20)
BUN: 23 mg/dL (ref 6–24)
Bilirubin Total: 0.5 mg/dL (ref 0.0–1.2)
CO2: 20 mmol/L (ref 20–29)
Calcium: 10 mg/dL (ref 8.7–10.2)
Chloride: 99 mmol/L (ref 96–106)
Creatinine, Ser: 1.49 mg/dL — ABNORMAL HIGH (ref 0.76–1.27)
Globulin, Total: 3.3 g/dL (ref 1.5–4.5)
Glucose: 114 mg/dL — ABNORMAL HIGH (ref 70–99)
Potassium: 3.9 mmol/L (ref 3.5–5.2)
Sodium: 137 mmol/L (ref 134–144)
Total Protein: 7.8 g/dL (ref 6.0–8.5)
eGFR: 56 mL/min/{1.73_m2} — ABNORMAL LOW (ref >59)

## 2022-06-26 LAB — LIPID PANEL
Chol/HDL Ratio: 3.8 ratio (ref 0.0–5.0)
Cholesterol, Total: 141 mg/dL (ref 100–199)
HDL: 37 mg/dL — ABNORMAL LOW (ref >39)
LDL Chol Calc (NIH): 89 mg/dL (ref 0–99)
Triglycerides: 79 mg/dL (ref 0–149)
VLDL Cholesterol Cal: 15 mg/dL (ref 5–40)

## 2022-06-26 NOTE — Telephone Encounter (Signed)
P.A. MOUNJARO 

## 2022-06-28 NOTE — Telephone Encounter (Signed)
P.A. approved til 06/26/23, pt informed

## 2022-06-29 ENCOUNTER — Telehealth: Payer: Self-pay | Admitting: *Deleted

## 2022-06-29 NOTE — Telephone Encounter (Signed)
-----   Message from Garrel Ridgel, [place] sent at 06/29/2022  7:04 AM EDT ----- Negative for fungus positive for trauma

## 2022-07-11 ENCOUNTER — Ambulatory Visit: Payer: Managed Care, Other (non HMO) | Admitting: Podiatry

## 2022-07-12 ENCOUNTER — Ambulatory Visit (INDEPENDENT_AMBULATORY_CARE_PROVIDER_SITE_OTHER): Payer: Managed Care, Other (non HMO) | Admitting: Podiatry

## 2022-07-12 ENCOUNTER — Encounter: Payer: Self-pay | Admitting: Podiatry

## 2022-07-12 DIAGNOSIS — M7671 Peroneal tendinitis, right leg: Secondary | ICD-10-CM

## 2022-07-12 MED ORDER — TRIAMCINOLONE ACETONIDE 10 MG/ML IJ SUSP
10.0000 mg | Freq: Once | INTRAMUSCULAR | Status: AC
  Administered 2022-07-12: 10 mg

## 2022-07-13 NOTE — Progress Notes (Signed)
Subjective:   Patient ID: Vincent Cantu, male   DOB: 51 y.o.   MRN: [id number]   HPI Patient presents stating he has had quite a bit of pain on the outside of his foot stating his heel is feeling quite a bit better and also is looking for the results of his fungal culture   ROS      Objective:  Physical Exam  Neurovascular status intact with patient found to have inflammation of the lateral side of the right foot base of fifth metatarsal which is probably compensatory for when he had the problems with his heel along with nails that get irritated and somewhat thick     Assessment:  Peroneal tendinitis right with inflammation around the base of the fifth metatarsal along with nails that were negative on fungal culture     Plan:  Reviewed negative nail cultures do not recommend treatment for those except for trimmings and did discuss injection explaining risk of the lateral side of the foot base of fifth metatarsal and he wants this done and I did sterile prep injected with 3 mg dexamethasone Kenalog 5 mg Xylocaine advised on supportive shoe reappoint if symptoms indicate

## 2022-07-18 ENCOUNTER — Other Ambulatory Visit: Payer: Self-pay | Admitting: Family Medicine

## 2022-07-24 ENCOUNTER — Encounter: Payer: Self-pay | Admitting: Internal Medicine

## 2022-09-09 ENCOUNTER — Encounter: Payer: Self-pay | Admitting: Internal Medicine

## 2022-09-22 ENCOUNTER — Other Ambulatory Visit: Payer: Self-pay

## 2022-09-22 ENCOUNTER — Encounter (HOSPITAL_COMMUNITY): Payer: Self-pay | Admitting: *Deleted

## 2022-09-22 ENCOUNTER — Emergency Department (HOSPITAL_COMMUNITY): Payer: Managed Care, Other (non HMO)

## 2022-09-22 ENCOUNTER — Emergency Department (HOSPITAL_COMMUNITY)
Admission: EM | Admit: 2022-09-22 | Discharge: 2022-09-22 | Disposition: A | Discharge status: Home / Self Care | Payer: Managed Care, Other (non HMO) | Attending: Emergency Medicine | Admitting: Emergency Medicine

## 2022-09-22 DIAGNOSIS — Z794 Long term (current) use of insulin: Secondary | ICD-10-CM | POA: Insufficient documentation

## 2022-09-22 DIAGNOSIS — I10 Essential (primary) hypertension: Secondary | ICD-10-CM | POA: Insufficient documentation

## 2022-09-22 DIAGNOSIS — Z7984 Long term (current) use of oral hypoglycemic drugs: Secondary | ICD-10-CM | POA: Diagnosis not present

## 2022-09-22 DIAGNOSIS — E119 Type 2 diabetes mellitus without complications: Secondary | ICD-10-CM | POA: Diagnosis not present

## 2022-09-22 DIAGNOSIS — Z79899 Other long term (current) drug therapy: Secondary | ICD-10-CM | POA: Insufficient documentation

## 2022-09-22 DIAGNOSIS — R42 Dizziness and giddiness: Secondary | ICD-10-CM | POA: Diagnosis present

## 2022-09-22 LAB — BASIC METABOLIC PANEL
Anion gap: 18 — ABNORMAL HIGH (ref 5–15)
BUN: 16 mg/dL (ref 6–20)
CO2: 20 mmol/L — ABNORMAL LOW (ref 22–32)
Calcium: 9.5 mg/dL (ref 8.9–10.3)
Chloride: 99 mmol/L (ref 98–111)
Creatinine, Ser: 1.87 mg/dL — ABNORMAL HIGH (ref 0.61–1.24)
GFR, Estimated: 42 mL/min — ABNORMAL LOW (ref >60)
Glucose, Bld: 239 mg/dL — ABNORMAL HIGH (ref 70–99)
Potassium: 2.9 mmol/L — ABNORMAL LOW (ref 3.5–5.1)
Sodium: 137 mmol/L (ref 135–145)

## 2022-09-22 LAB — CBC
HCT: 41.7 % (ref 39.0–52.0)
Hemoglobin: 13.3 g/dL (ref 13.0–17.0)
MCH: 26.4 pg (ref 26.0–34.0)
MCHC: 31.9 g/dL (ref 30.0–36.0)
MCV: 82.9 fL (ref 80.0–100.0)
Platelets: 239 10*3/uL (ref 150–400)
RBC: 5.03 MIL/uL (ref 4.22–5.81)
RDW: 14.7 % (ref 11.5–15.5)
WBC: 8.8 10*3/uL (ref 4.0–10.5)
nRBC: 0 % (ref 0.0–0.2)

## 2022-09-22 LAB — RAPID URINE DRUG SCREEN, HOSP PERFORMED
Amphetamines: NOT DETECTED
Barbiturates: NOT DETECTED
Benzodiazepines: NOT DETECTED
Cocaine: NOT DETECTED
Opiates: NOT DETECTED
Tetrahydrocannabinol: POSITIVE — AB

## 2022-09-22 LAB — TROPONIN I (HIGH SENSITIVITY)
Troponin I (High Sensitivity): 5 ng/L (ref <18)
Troponin I (High Sensitivity): 7 ng/L (ref <18)

## 2022-09-22 LAB — ETHANOL: Alcohol, Ethyl (B): <10 mg/dL (ref <10)

## 2022-09-22 MED ORDER — ONDANSETRON HCL 4 MG PO TABS: 4.0000 mg | ORAL_TABLET | Freq: Four times a day (QID) | ORAL | 0 refills | Status: DC

## 2022-09-22 MED ORDER — ONDANSETRON 4 MG PO TBDP
8.0000 mg | ORAL_TABLET | Freq: Once | ORAL | Status: AC
  Administered 2022-09-22: 8 mg via ORAL
  Filled 2022-09-22: qty 2

## 2022-09-22 NOTE — Discharge Instructions (Addendum)
Please take Zofran as needed for nausea/vomiting. I recommend close follow-up with PCP for reevaluation.  Please do not hesitate to return to emergency department if worrisome signs symptoms we discussed become apparent.

## 2022-09-22 NOTE — ED Triage Notes (Signed)
Pt states woke up to go to the bathroom and states he was dizzy when he went to bed tonight at 2330. Pt states that he has 3-4 times. Pt is alert and denies weakness to either side of body. NO pain. Pt states some mid upper abdominal pain

## 2022-09-22 NOTE — ED Provider Notes (Signed)
[facility] EMERGENCY DEPARTMENT Provider Note   CSN: [id number] Arrival date & time: 09/22/22  0256     History  Chief Complaint  Patient presents with   Dizziness    Vincent Cantu is a 51 y.o. male with a past medical history of hypertension, diabetes presenting to the emergency department for evaluation of dizziness.  Patient states he started to feel dizzy and lightheaded around 11 PM last night when he was sitting on his couch.  States he feels like he is about to lose balance.  The dizziness is persistent prompting him to go to the emergency department.  States he has thrown up a few times while being in the emergency department.  Denies any blood in his vomit.  States he has felt better with Zofran.  Denies any chest pain, shortness of breath, bowel changes, urinary symptoms, rash, fever.   Dizziness      Home Medications Prior to Admission medications   Medication Sig Start Date End Date Taking? Authorizing Provider  amLODipine (NORVASC) 10 MG tablet Take 1 tablet (10 mg total) by mouth daily. 06/25/22   Denita Lung, MD  atorvastatin (LIPITOR) 20 MG tablet Take 1 tablet (20 mg total) by mouth daily. 06/25/22   Denita Lung, MD  Blood Glucose Monitoring Suppl DEVI Pt needs a one touch ultra meter 08/20/17   Denita Lung, MD  cetirizine (ZYRTEC) 10 MG tablet Take 10 mg by mouth at bedtime.    [provider]  Cholecalciferol (VITAMIN D3) 125 MCG (5000 UT) CAPS Take 5,000 Units by mouth daily. Patient not taking: Reported on 04/09/2022    [provider]  EDARBYCLOR 40-25 MG TABS Take 1 tablet by mouth daily. 06/25/22   Denita Lung, MD  glucose blood test strip Test once a day. Uses one touch ultra meter 08/20/17   Denita Lung, MD  Melatonin 10 MG TABS Take 10 mg by mouth at bedtime.    [provider]  meloxicam (MOBIC) 15 MG tablet Take 1 tablet (15 mg total) by mouth daily. Patient not taking: Reported on 06/25/2022  06/13/22   Tyson Dense T, DPM  metFORMIN (GLUCOPHAGE) 1000 MG tablet Take 1 tablet (1,000 mg total) by mouth 2 (two) times daily. 06/25/22   Denita Lung, MD  metoprolol (TOPROL-XL) 200 MG 24 hr tablet Take 1 tablet (200 mg total) by mouth daily. 06/25/22   Denita Lung, MD  [facility] DELICA LANCETS FINE MISC Test once a day 08/20/17   Denita Lung, MD  OVER THE COUNTER MEDICATION Take 1 capsule by mouth daily. Sea Moss with Beet Root    [provider]  tadalafil (CIALIS) 20 MG tablet Take 1 tablet (20 mg total) by mouth daily as needed for erectile dysfunction. 06/25/22   Denita Lung, MD  terazosin (HYTRIN) 2 MG capsule TAKE 1 CAPSULE BY MOUTH EVERYDAY AT BEDTIME 06/25/22   Denita Lung, MD  tirzepatide [facility]) 7.5 MG/0.5ML Pen Inject 7.5 mg into the skin once a week. 06/25/22   Denita Lung, MD  traMADol (ULTRAM) 50 MG tablet Take 1-2 tablets (50-100 mg total) by mouth every 6 (six) hours as needed. Patient not taking: Reported on 06/25/2022 09/27/21   Coralie Keens, MD      Allergies    Patient has no known allergies.    Review of Systems   Review of Systems  Neurological:  Positive for dizziness.    Physical Exam Updated  Vital Signs BP 111/73 (BP Location: Left Arm)   Pulse 81   Temp 98.3 F (36.8 C)   Resp 16   SpO2 100%  Physical Exam  ED Results / Procedures / Treatments   Labs (all labs ordered are listed, but only abnormal results are displayed) Labs Reviewed  BASIC METABOLIC PANEL - Abnormal; Notable for the following components:      Result Value   Potassium 2.9 (*)    CO2 20 (*)    Glucose, Bld 239 (*)    Creatinine, Ser 1.87 (*)    GFR, Estimated 42 (*)    Anion gap 18 (*)    All other components within normal limits  RAPID URINE DRUG SCREEN, HOSP PERFORMED - Abnormal; Notable for the following components:   Tetrahydrocannabinol POSITIVE (*)    All other components within normal limits  CBC  ETHANOL  TROPONIN I (HIGH SENSITIVITY)   TROPONIN I (HIGH SENSITIVITY)    EKG EKG Interpretation  Date/Time:  Sunday September 22 2022 04:03:03 EST Ventricular Rate:  98 PR Interval:  190 QRS Duration: 116 QT Interval:  372 QTC Calculation: 474 R Axis:   -12 Text Interpretation: Normal sinus rhythm Minimal voltage criteria for LVH, may be normal variant ( Cornell product ) When compared with ECG of 19-Sep-2021 08:51, PREVIOUS ECG IS PRESENT No significant change since last tracing Confirmed by Blanchie Dessert [phone]) on 09/22/2022 11:33:00 AM  Radiology CT Head Wo Contrast  Result Date: 09/22/2022 CLINICAL DATA:  Dizziness EXAM: CT HEAD WITHOUT CONTRAST TECHNIQUE: Contiguous axial images were obtained from the base of the skull through the vertex without intravenous contrast. RADIATION DOSE REDUCTION: This exam was performed according to the departmental dose-optimization program which includes automated exposure control, adjustment of the mA and/or kV according to patient size and/or use of iterative reconstruction technique. COMPARISON:  None Available. FINDINGS: Brain: No evidence of acute infarction, hemorrhage, hydrocephalus, extra-axial collection or mass lesion/mass effect. Nonspecific periventricular, subcortical and deep white matter hypoattenuation. Vascular: No hyperdense vessel or unexpected calcification. Skull: Normal. Negative for fracture or focal lesion. Sinuses/Orbits: No acute finding. Other: None. IMPRESSION: 1. No acute intracranial abnormality. 2. Probable chronic microvascular ischemic white matter changes. Electronically Signed   By: Jacqulynn Cadet M.D.   On: 09/22/2022 08:32   DG Chest Port 1 View  Result Date: 09/22/2022 CLINICAL DATA:  Chest pain EXAM: PORTABLE CHEST 1 VIEW COMPARISON:  12/31/2005 FINDINGS: Low volume film. The lungs are clear without focal pneumonia, edema, pneumothorax or pleural effusion. The cardiopericardial silhouette is within normal limits for size. The visualized bony  structures of the thorax are unremarkable. IMPRESSION: No active disease. Electronically Signed   By: Misty Stanley M.D.   On: 09/22/2022 05:41    Procedures Procedures    Medications Ordered in ED Medications  ondansetron (ZOFRAN-ODT) disintegrating tablet 8 mg (8 mg Oral Given 09/22/22 0424)    ED Course/ Medical Decision Making/ A&P                           Medical Decision Making Risk Prescription drug management.   This patient presents to the ED for dizziness, this involves an extensive number of treatment options, and is a complaint that carries with a high risk of complications and morbidity.  The differential diagnosis includes BPPV, labyrinthitis, ICH/CVA, Mnire's disease.  This is not an exhaustive list.  Comorbidities that complicate the patient evaluation See HPI  Social determinants of health NA  Additional history obtained: Additional history obtained from EMR. External records from outside source obtained and review including prior labs  Cardiac monitoring/EKG: The patient was maintained on a cardiac monitor.  I personally reviewed and interpreted the cardiac monitor which showed an underlying rhythm of: Sinus rhythm.  Lab tests: I ordered and personally interpreted labs.  The pertinent results include WBC normal. Hbg normal. Platelets normal. Potassium 2.9  BUN, creatinine uptrending 1.87. No transaminitis.  Imaging studies: I ordered imaging studies including CT head negative, chest x-ray negative. I personally reviewed, interpreted imaging and agree with the radiologist's interpretations.  Problem list/ ED course/ Critical interventions/ Medical management: HPI: See above Vital signs within normal range and stable throughout visit. Laboratory/imaging studies significant for: See above. On physical examination, patient is afebrile and appears in no acute distress.  Patient vomited a few times while waiting for room.  He was given Zofran for  nausea.  States he has felt better now.  Denies nausea or vomiting.  Per nursing patient did well on ambulation and denied having any dizziness.  CT head with no acute intracranial abnormality.  ICH/CVA unlikely as neuro exam was unremarkable.  ACS unlikely as EKG showed no evidence of ischemic changes.  Patient's clinical presentations and laboratory/imaging studies are most concerned for vertigo with unclear etiology. I sent a Rx of Zofran. Advised patient to take Zofran for nausea as needed.  Follow-up with PCP for further evaluation and management.. Zofran ordered. Reevaluation of the patient after these medications showed that the patient improved.   I have reviewed the patient home medicines and have made adjustments as needed.  Consultations obtained: NA  Disposition Continued outpatient therapy. Follow-up with PCP recommended for reevaluation of symptoms. Treatment plan discussed with patient.  Pt acknowledged understanding was agreeable to the plan. Worrisome signs and symptoms were discussed with patient, and patient acknowledged understanding to return to the ED if they noticed these signs and symptoms. Patient was stable upon discharge.   This chart was dictated using voice recognition software.  Despite best efforts to proofread,  errors can occur which can change the documentation meaning.          Final Clinical Impression(s) / ED Diagnoses Final diagnoses:  Dizziness    Rx / DC Orders ED Discharge Orders          Ordered    ondansetron (ZOFRAN) 4 MG tablet  Every 6 hours        09/22/22 1159              Rex Kras, [place] 09/22/22 2114    Blanchie Dessert, MD 09/25/22 [phone]

## 2022-09-22 NOTE — ED Notes (Signed)
Patient was ambulatory w/ no issues w/ no assistance.

## 2022-09-22 NOTE — ED Provider Triage Note (Signed)
Emergency Medicine Provider Triage Evaluation Note  Vincent Cantu , a 51 y.o. male  was evaluated in triage.  Pt complains of 50 year old male presents with complaint of feeling lightheaded tonight about an hour and a half after eating a brownie that may have had marijuana in it.  Symptoms started at 10 PM, patient went home and went to bed.  Woke up continuing to feel lightheaded and decided to come to the emergency room.  Began vomiting in the car.  Emesis is nonbloody nonbilious.  Lightheadedness is worse with standing.  No cardiac history.  States that he does have a midsternal chest discomfort without shortness of breath, diaphoresis.  Denies abdominal pain, changes in bowel or bladder habits.  Review of Systems  Positive: As above Negative: As above  Physical Exam  BP 137/83 (BP Location: Right Arm)   Pulse (!) 124   Temp 98.2 F (36.8 C) (Oral)   Resp 20   SpO2 97%  Gen:   Awake, no distress   Resp:  Normal effort  MSK:   Moves extremities without difficulty  Other:  Abdomen soft nontender  Medical Decision Making  Medically screening exam initiated at 3:47 AM.  Appropriate orders placed.  Vincent Cantu was informed that the remainder of the evaluation will be completed by another provider, this initial triage assessment does not replace that evaluation, and the importance of remaining in the ED until their evaluation is complete.  Provided with Zofran in triage.  Labs and imaging ordered.   Vincent Learn, PA-C 09/22/22 [phone]

## 2022-09-27 ENCOUNTER — Telehealth: Payer: Self-pay

## 2022-09-27 ENCOUNTER — Other Ambulatory Visit: Payer: Self-pay

## 2022-09-27 DIAGNOSIS — I152 Hypertension secondary to endocrine disorders: Secondary | ICD-10-CM

## 2022-09-27 DIAGNOSIS — E118 Type 2 diabetes mellitus with unspecified complications: Secondary | ICD-10-CM

## 2022-09-27 MED ORDER — TERAZOSIN HCL 2 MG PO CAPS: ORAL_CAPSULE | ORAL | 3 refills | Status: DC

## 2022-09-27 MED ORDER — METFORMIN HCL 1000 MG PO TABS: 1000.0000 mg | ORAL_TABLET | Freq: Two times a day (BID) | ORAL | 1 refills | Status: DC

## 2022-09-27 NOTE — Telephone Encounter (Signed)
Transition Care Management Unsuccessful Follow-up Telephone Call  Date of discharge and from where:  [facility] 09[facility]  Attempts:  1st Attempt  Reason for unsuccessful TCM follow-up call:  Left voice message

## 2022-09-30 ENCOUNTER — Other Ambulatory Visit (INDEPENDENT_AMBULATORY_CARE_PROVIDER_SITE_OTHER): Payer: Managed Care, Other (non HMO)

## 2022-09-30 DIAGNOSIS — Z23 Encounter for immunization: Secondary | ICD-10-CM | POA: Diagnosis not present

## 2022-10-14 ENCOUNTER — Telehealth: Payer: Self-pay | Admitting: Family Medicine

## 2022-10-14 ENCOUNTER — Other Ambulatory Visit: Payer: Self-pay

## 2022-10-14 DIAGNOSIS — E118 Type 2 diabetes mellitus with unspecified complications: Secondary | ICD-10-CM

## 2022-10-14 MED ORDER — TIRZEPATIDE 7.5 MG/0.5ML ~~LOC~~ SOAJ: 7.5000 mg | SUBCUTANEOUS | 1 refills | Status: DC

## 2022-10-14 NOTE — Telephone Encounter (Signed)
Fax refill request from [facility]  [place] pen .61m 4's 7.5 mg 90  day supply

## 2022-10-21 ENCOUNTER — Telehealth: Payer: Self-pay | Admitting: Internal Medicine

## 2022-10-21 DIAGNOSIS — E1159 Type 2 diabetes mellitus with other circulatory complications: Secondary | ICD-10-CM

## 2022-10-21 DIAGNOSIS — E1169 Type 2 diabetes mellitus with other specified complication: Secondary | ICD-10-CM

## 2022-10-21 MED ORDER — METOPROLOL SUCCINATE ER 200 MG PO TB24: 200.0000 mg | ORAL_TABLET | Freq: Every day | ORAL | 2 refills | Status: DC

## 2022-10-21 MED ORDER — ATORVASTATIN CALCIUM 20 MG PO TABS: 20.0000 mg | ORAL_TABLET | Freq: Every day | ORAL | 2 refills | Status: DC

## 2022-10-21 MED ORDER — AMLODIPINE BESYLATE 10 MG PO TABS: 10.0000 mg | ORAL_TABLET | Freq: Every day | ORAL | 2 refills | Status: DC

## 2022-10-21 NOTE — Telephone Encounter (Signed)
[facility]

## 2022-10-22 ENCOUNTER — Encounter: Payer: Self-pay | Admitting: Family Medicine

## 2022-10-22 ENCOUNTER — Ambulatory Visit (INDEPENDENT_AMBULATORY_CARE_PROVIDER_SITE_OTHER): Payer: Managed Care, Other (non HMO) | Admitting: Family Medicine

## 2022-10-22 VITALS — BP 126/84 | HR 81 | Wt 321.4 lb

## 2022-10-22 DIAGNOSIS — E1122 Type 2 diabetes mellitus with diabetic chronic kidney disease: Secondary | ICD-10-CM

## 2022-10-22 DIAGNOSIS — E1169 Type 2 diabetes mellitus with other specified complication: Secondary | ICD-10-CM

## 2022-10-22 DIAGNOSIS — N183 Chronic kidney disease, stage 3 unspecified: Secondary | ICD-10-CM

## 2022-10-22 DIAGNOSIS — J309 Allergic rhinitis, unspecified: Secondary | ICD-10-CM

## 2022-10-22 DIAGNOSIS — E1159 Type 2 diabetes mellitus with other circulatory complications: Secondary | ICD-10-CM

## 2022-10-22 DIAGNOSIS — E118 Type 2 diabetes mellitus with unspecified complications: Secondary | ICD-10-CM

## 2022-10-22 DIAGNOSIS — E785 Hyperlipidemia, unspecified: Secondary | ICD-10-CM

## 2022-10-22 DIAGNOSIS — I152 Hypertension secondary to endocrine disorders: Secondary | ICD-10-CM

## 2022-10-22 LAB — POCT GLYCOSYLATED HEMOGLOBIN (HGB A1C): Hemoglobin A1C: 6.8 % — AB (ref 4.0–5.6)

## 2022-10-22 NOTE — Progress Notes (Signed)
Subjective:    Patient ID: Vincent Cantu, male    DOB: [birth year], 51 y.o.   MRN: [id number]  JOHNOTHAN Cantu is a 51 y.o. male who presents for follow-up of Type 2 diabetes mellitus.  Home blood sugar records: fasting range: 115 Current symptoms/problems include none and have been stable. Daily foot checks:yes   Any foot concerns: none How often blood sugars checked: Exercise: Home exercise routine includes walking 45 minutes daily. Diet: well balanced diet He is on Mounjaro and his weight has not shown great improvement.  He is presently on 7.5 mg.  Continues on metformin.  He is also taking terazosin, amlodipine and Edarbiclor and metoprolol.  Uses Cialis on an as-needed basis.  He is interested in going on Dexcom to help get his diabetes under better control. The following portions of the patient's history were reviewed and updated as appropriate: allergies, current medications, past medical history, past social history and problem list.  ROS as in subjective above.     Objective:    Physical Exam Alert and in no distress otherwise not examined. Hemoglobin A1c 6.8 Blood pressure 126/84, pulse 81, weight (!) 321 lb 6.4 oz (145.8 kg), SpO2 96 %.  Lab Review    Latest Ref Rng & Units 10/22/2022    2:11 PM 09/22/2022    3:54 AM 06/25/2022   10:05 AM 06/25/2022    9:29 AM 09/27/2021    1:25 AM  Diabetic Labs  HbA1c 4.0 - 5.6 % 6.8   6.8     Microalbumin mg/L   <5.0     Micro/Creat Ratio    <3.1     Chol 100 - 199 mg/dL    141    HDL >39 mg/dL    37    Calc LDL 0 - 99 mg/dL    89    Triglycerides 0 - 149 mg/dL    79    Creatinine 0.61 - 1.24 mg/dL  1.87   1.49  1.52       10/22/2022    1:52 PM 09/22/2022   12:45 PM 09/22/2022   12:00 PM 09/22/2022   10:36 AM 09/22/2022    8:09 AM  BP/Weight  Systolic BP 106 269 485 462 703  Diastolic BP 84 74 75 73 70  Wt. (Lbs) 321.4      BMI 41.83 kg/m2          Latest Ref Rng & Units 01/24/2022   12:00 AM 06/19/2021    1:30 PM   Foot/eye exam completion dates  Eye Exam No Retinopathy No Retinopathy       Foot Form Completion   Done     This result is from an external source.    Terek  reports that he has never smoked. He has never used smokeless tobacco. He reports that he does not drink alcohol and does not use drugs.     Assessment & Plan:    Type 2 diabetes with complication (HCC) - Plan: POCT glycosylated hemoglobin (Hb A1C)  Hypertension associated with diabetes (HCC)  Allergic rhinitis, unspecified seasonality, unspecified trigger  CKD stage 3 due to type 2 diabetes mellitus ([place])  Hyperlipidemia associated with type 2 diabetes mellitus ([place])  Morbid obesity ([place]) Presently he is on Mounjaro 7.5 mg.  We will continue him on this until his medications run out and consider increasing this to 15.  He will also return here for placement of the Dexcom as that is the one that he would  like to try and I will have the staff work with him getting this set up and then I will check him again 2 weeks later to go over the results of that and fine-tune it.

## 2022-10-30 ENCOUNTER — Telehealth: Payer: Self-pay | Admitting: Family Medicine

## 2022-10-30 NOTE — Telephone Encounter (Signed)
Please call patient, he is wanting to get update on Dexcom

## 2022-10-31 NOTE — Telephone Encounter (Signed)
I dont see that a Dexcom has been ordered. Where you going to send in a prescription order for the Dexcom?

## 2022-11-01 NOTE — Telephone Encounter (Signed)
Complete [facility] form, print last 2 OV that include diabetes details, preferably diabetes check or CPE, insurance card, last A1c, Patient Demographic sheet, see me if you have any other questions

## 2022-11-01 NOTE — Telephone Encounter (Signed)
Are you able to help me with this. Im not sure how this is usually ordered.

## 2022-11-04 NOTE — Telephone Encounter (Signed)
Printed everything needed for you

## 2022-11-04 NOTE — Telephone Encounter (Signed)
Vincent Cantu has filled out form and faxed back

## 2022-11-06 ENCOUNTER — Ambulatory Visit (INDEPENDENT_AMBULATORY_CARE_PROVIDER_SITE_OTHER): Payer: Managed Care, Other (non HMO)

## 2022-11-06 ENCOUNTER — Ambulatory Visit (INDEPENDENT_AMBULATORY_CARE_PROVIDER_SITE_OTHER): Payer: Managed Care, Other (non HMO) | Admitting: Podiatry

## 2022-11-06 ENCOUNTER — Encounter: Payer: Self-pay | Admitting: Podiatry

## 2022-11-06 DIAGNOSIS — M76822 Posterior tibial tendinitis, left leg: Secondary | ICD-10-CM | POA: Diagnosis not present

## 2022-11-06 DIAGNOSIS — M722 Plantar fascial fibromatosis: Secondary | ICD-10-CM

## 2022-11-06 MED ORDER — TRIAMCINOLONE ACETONIDE 10 MG/ML IJ SUSP
10.0000 mg | Freq: Once | INTRAMUSCULAR | Status: AC
  Administered 2022-11-06: 10 mg

## 2022-11-06 MED ORDER — DICLOFENAC SODIUM 75 MG PO TBEC: 75.0000 mg | DELAYED_RELEASE_TABLET | Freq: Two times a day (BID) | ORAL | 2 refills | Status: DC

## 2022-11-06 NOTE — Progress Notes (Signed)
Subjective:   Patient ID: Vincent Cantu, male   DOB: 51 y.o.   MRN: [id number]   HPI Patient presents stating he is developed a lot of pain in the bottom of his left heel and has not dealt with this ever before 4 with this foot before.  Does not remember injury   ROS      Objective:  Physical Exam  Neurovascular status intact muscle strength adequate inflammation pain of the plantar fascia at the insertional point tendon calcaneus left with fluid buildup around the medial band     Assessment:  Acute [place] fasciitis left inflammation fluid around the medial band     Plan:  H&P x-ray reviewed sterile prep injected the plantar fascia left 3 mg Kenalog 5 mg Xylocaine applied fascial brace instructed on stretching exercises and reappoint for [place] to recheck again in the next several weeks.  Gave instructions on shoe gear modifications  X-rays indicate that there is small spur no indications of stress fracture arthritis around the fascia

## 2022-11-07 ENCOUNTER — Telehealth: Payer: Self-pay | Admitting: Internal Medicine

## 2022-11-07 MED ORDER — DEXCOM G7 SENSOR MISC: 1 refills | Status: DC

## 2022-11-07 MED ORDER — DEXCOM G7 RECEIVER DEVI: 1 refills | Status: DC

## 2022-11-07 NOTE — Telephone Encounter (Signed)
Pt called and wanted dexcom G7 to go [facility] vs a DME company. I have resent G7 to [facility]

## 2022-11-20 ENCOUNTER — Encounter: Payer: Self-pay | Admitting: Podiatry

## 2022-11-20 ENCOUNTER — Ambulatory Visit (INDEPENDENT_AMBULATORY_CARE_PROVIDER_SITE_OTHER): Payer: Managed Care, Other (non HMO) | Admitting: Podiatry

## 2022-11-20 VITALS — BP 139/85

## 2022-11-20 DIAGNOSIS — M722 Plantar fascial fibromatosis: Secondary | ICD-10-CM

## 2022-11-20 NOTE — Progress Notes (Signed)
Subjective:   Patient ID: Vincent Cantu, male   DOB: 52 y.o.   MRN: [id number]   HPI Patient presents stating his left heel is feeling quite a bit better and there is been a lot of improvement.  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

## 2022-11-26 ENCOUNTER — Encounter: Payer: Self-pay | Admitting: Family Medicine

## 2022-11-26 ENCOUNTER — Ambulatory Visit: Payer: Managed Care, Other (non HMO) | Admitting: Family Medicine

## 2022-11-26 VITALS — BP 130/86 | HR 79 | Temp 98.2°F | Resp 18 | Wt 320.2 lb

## 2022-11-26 DIAGNOSIS — R1012 Left upper quadrant pain: Secondary | ICD-10-CM | POA: Diagnosis not present

## 2022-11-26 NOTE — Patient Instructions (Signed)
Use of MiraLAX daily and lets see what that does over time to relieve your symptoms.  If he had continued difficulty we might need to refer you to GI

## 2022-11-26 NOTE — Progress Notes (Signed)
   Subjective:    Patient ID: Vincent Cantu, male    DOB: 06-07-1971, 52 y.o.   MRN: [id number]  HPI He complains of a 4 month history of difficulty with intermittent left shoulder, axillary and rib discomfort as well as left upper quadrant discomfort.  It usually will start in the left upper quadrant.  He does describe a full sensation in his abdomen when this occurs but he has had no weight loss.  He has had previous colonoscopy (2022) which was negative.  He notes also increased eructation as well as flatulence he does state that if he has a good BM the symptoms do diminish in nature.   Review of Systems     Objective:   Physical Exam Alert and in no distress. Tympanic membranes and canals are normal. Pharyngeal area is normal. Neck is supple without adenopathy or thyromegaly. Cardiac exam shows a regular sinus rhythm without murmurs or gallops. Lungs are clear to auscultation.  Abdominal exam shows decreased bowel sounds without masses or tenderness.        Assessment & Plan:  Left upper quadrant pain I explained that it is vague as to exactly what is causing this but I think is reasonable to try him on MiraLAX to see if that will help control his symptoms since this does seem to be GI especially in lieu of negative colonoscopy.

## 2022-12-26 ENCOUNTER — Encounter: Payer: Self-pay | Admitting: Family Medicine

## 2022-12-26 ENCOUNTER — Ambulatory Visit (INDEPENDENT_AMBULATORY_CARE_PROVIDER_SITE_OTHER): Payer: Managed Care, Other (non HMO) | Admitting: Family Medicine

## 2022-12-26 VITALS — BP 120/84 | HR 89 | Temp 98.0°F | Resp 16 | Wt 319.8 lb

## 2022-12-26 DIAGNOSIS — E785 Hyperlipidemia, unspecified: Secondary | ICD-10-CM

## 2022-12-26 DIAGNOSIS — E1159 Type 2 diabetes mellitus with other circulatory complications: Secondary | ICD-10-CM

## 2022-12-26 DIAGNOSIS — I152 Hypertension secondary to endocrine disorders: Secondary | ICD-10-CM

## 2022-12-26 DIAGNOSIS — E1169 Type 2 diabetes mellitus with other specified complication: Secondary | ICD-10-CM | POA: Diagnosis not present

## 2022-12-26 DIAGNOSIS — E1122 Type 2 diabetes mellitus with diabetic chronic kidney disease: Secondary | ICD-10-CM

## 2022-12-26 DIAGNOSIS — E118 Type 2 diabetes mellitus with unspecified complications: Secondary | ICD-10-CM | POA: Diagnosis not present

## 2022-12-26 DIAGNOSIS — J309 Allergic rhinitis, unspecified: Secondary | ICD-10-CM

## 2022-12-26 MED ORDER — FREESTYLE LIBRE 3 READER DEVI: 1.0000 | Freq: Every day | 1 refills | Status: AC

## 2022-12-26 MED ORDER — FREESTYLE LIBRE 3 SENSOR MISC: 2 refills | Status: DC

## 2022-12-26 NOTE — Patient Instructions (Signed)
Take 2 shots weekly and I will see you in 2 months if you run into trouble with any symptoms call Proper posturing is important.  Take 2 Aleve twice per day for the next 2 weeks to see what that we will do Recheck your tumor

## 2022-12-26 NOTE — Progress Notes (Signed)
Subjective:    Patient ID: Vincent Cantu, male    DOB: Nov 18, 1971, 52 y.o.   MRN: [id number]  Vincent Cantu is a 52 y.o. male who presents for follow-up of Type 2 diabetes mellitus.  Home blood sugar records: fasting range: 120's Current symptoms/problems include none and have been stable. Daily foot checks: yes   Any foot concerns: none  How often blood sugars checked:  daily. He did not start using the Dexcom due to the cost ($1,000 out of pocket cost). He originally wanted on the continuous glucose monitoring to help do a better job of taking care of his underlying diabetes and his diet. Exercise: Home exercise routine includes walking daily. Diet: balanced He continues on Mounjaro 7.5 and has an overabundance supply of that.       Objective:    Physical Exam Alert and in no distress otherwise not examined.  Blood pressure 120/84, pulse 89, temperature 98 F (36.7 C), temperature source Oral, resp. rate 16, weight (!) 319 lb 12.8 oz (145.1 kg), SpO2 98 %.  Lab Review    Latest Ref Rng & Units 10/22/2022    2:11 PM 09/22/2022    3:54 AM 06/25/2022   10:05 AM 06/25/2022    9:29 AM 09/27/2021    1:25 AM  Diabetic Labs  HbA1c 4.0 - 5.6 % 6.8   6.8     Microalbumin mg/L   <5.0     Micro/Creat Ratio    <3.1     Chol 100 - 199 mg/dL    141    HDL >39 mg/dL    37    Calc LDL 0 - 99 mg/dL    89    Triglycerides 0 - 149 mg/dL    79    Creatinine 0.61 - 1.24 mg/dL  1.87   1.49  1.52       12/26/2022   10:19 AM 11/26/2022    3:14 PM 11/20/2022    8:22 AM 10/22/2022    1:52 PM 09/22/2022   12:45 PM  BP/Weight  Systolic BP 122 482 500 370 488  Diastolic BP 84 86 85 84 74  Wt. (Lbs) 319.8 320.2  321.4   BMI 41.62 kg/m2 41.67 kg/m2  41.83 kg/m2       Latest Ref Rng & Units 01/24/2022   12:00 AM 06/19/2021    1:30 PM  Foot/eye exam completion dates  Eye Exam No Retinopathy No Retinopathy       Foot Form Completion   Done     This result is from an external source.    Vincent Cantu   reports that he has never smoked. He has never used smokeless tobacco. He reports that he does not drink alcohol and does not use drugs.     Assessment & Plan:    Hypertension associated with diabetes ([place])  Allergic rhinitis, unspecified seasonality, unspecified trigger  Type 2 diabetes with complication ([place])  Hyperlipidemia associated with type 2 diabetes mellitus ([place])  CKD stage 3 due to type 2 diabetes mellitus ([place]) His weight is essentially unchanged and because of that I think is reasonable to go up to 15 mg of the Mounjaro.  He will double his present dosing of taking 2 of the 7.5 mg shots weekly and in roughly 2 months he should run out.  We will check him again then in terms of his diabetes and hopefully see some weight reduction.  He was comfortable with that.  Did send off for freestyle  libre and see if that is covered under his insurance.

## 2022-12-31 ENCOUNTER — Encounter (HOSPITAL_BASED_OUTPATIENT_CLINIC_OR_DEPARTMENT_OTHER): Payer: Self-pay

## 2022-12-31 ENCOUNTER — Other Ambulatory Visit: Payer: Self-pay

## 2022-12-31 ENCOUNTER — Emergency Department (HOSPITAL_BASED_OUTPATIENT_CLINIC_OR_DEPARTMENT_OTHER): Payer: Managed Care, Other (non HMO)

## 2022-12-31 ENCOUNTER — Emergency Department (HOSPITAL_BASED_OUTPATIENT_CLINIC_OR_DEPARTMENT_OTHER)
Admission: EM | Admit: 2022-12-31 | Discharge: 2022-12-31 | Disposition: A | Discharge status: Home / Self Care | Payer: Managed Care, Other (non HMO) | Attending: Emergency Medicine | Admitting: Emergency Medicine

## 2022-12-31 ENCOUNTER — Emergency Department (HOSPITAL_BASED_OUTPATIENT_CLINIC_OR_DEPARTMENT_OTHER): Payer: Managed Care, Other (non HMO) | Admitting: Radiology

## 2022-12-31 DIAGNOSIS — E1159 Type 2 diabetes mellitus with other circulatory complications: Secondary | ICD-10-CM

## 2022-12-31 DIAGNOSIS — E1122 Type 2 diabetes mellitus with diabetic chronic kidney disease: Secondary | ICD-10-CM | POA: Diagnosis not present

## 2022-12-31 DIAGNOSIS — E782 Mixed hyperlipidemia: Secondary | ICD-10-CM | POA: Diagnosis not present

## 2022-12-31 DIAGNOSIS — E1169 Type 2 diabetes mellitus with other specified complication: Secondary | ICD-10-CM

## 2022-12-31 DIAGNOSIS — R0789 Other chest pain: Secondary | ICD-10-CM | POA: Insufficient documentation

## 2022-12-31 DIAGNOSIS — E118 Type 2 diabetes mellitus with unspecified complications: Secondary | ICD-10-CM

## 2022-12-31 DIAGNOSIS — R1011 Right upper quadrant pain: Secondary | ICD-10-CM | POA: Insufficient documentation

## 2022-12-31 DIAGNOSIS — N189 Chronic kidney disease, unspecified: Secondary | ICD-10-CM | POA: Insufficient documentation

## 2022-12-31 DIAGNOSIS — R1013 Epigastric pain: Secondary | ICD-10-CM | POA: Diagnosis not present

## 2022-12-31 DIAGNOSIS — I129 Hypertensive chronic kidney disease with stage 1 through stage 4 chronic kidney disease, or unspecified chronic kidney disease: Secondary | ICD-10-CM | POA: Diagnosis not present

## 2022-12-31 DIAGNOSIS — Z79899 Other long term (current) drug therapy: Secondary | ICD-10-CM | POA: Insufficient documentation

## 2022-12-31 DIAGNOSIS — Z7984 Long term (current) use of oral hypoglycemic drugs: Secondary | ICD-10-CM | POA: Diagnosis not present

## 2022-12-31 DIAGNOSIS — R079 Chest pain, unspecified: Secondary | ICD-10-CM

## 2022-12-31 LAB — BASIC METABOLIC PANEL
Anion gap: 12 (ref 5–15)
BUN: 17 mg/dL (ref 6–20)
CO2: 24 mmol/L (ref 22–32)
Calcium: 9.9 mg/dL (ref 8.9–10.3)
Chloride: 102 mmol/L (ref 98–111)
Creatinine, Ser: 1.52 mg/dL — ABNORMAL HIGH (ref 0.61–1.24)
GFR, Estimated: 54 mL/min — ABNORMAL LOW (ref >60)
Glucose, Bld: 115 mg/dL — ABNORMAL HIGH (ref 70–99)
Potassium: 4.1 mmol/L (ref 3.5–5.1)
Sodium: 138 mmol/L (ref 135–145)

## 2022-12-31 LAB — URINALYSIS, ROUTINE W REFLEX MICROSCOPIC
Bilirubin Urine: NEGATIVE
Glucose, UA: NEGATIVE mg/dL
Hgb urine dipstick: NEGATIVE
Ketones, ur: NEGATIVE mg/dL
Leukocytes,Ua: NEGATIVE
Nitrite: NEGATIVE
Protein, ur: NEGATIVE mg/dL
Specific Gravity, Urine: 1.02 (ref 1.005–1.030)
pH: 6 (ref 5.0–8.0)

## 2022-12-31 LAB — CBC
HCT: 40.4 % (ref 39.0–52.0)
Hemoglobin: 13.1 g/dL (ref 13.0–17.0)
MCH: 26.2 pg (ref 26.0–34.0)
MCHC: 32.4 g/dL (ref 30.0–36.0)
MCV: 80.8 fL (ref 80.0–100.0)
Platelets: 258 10*3/uL (ref 150–400)
RBC: 5 MIL/uL (ref 4.22–5.81)
RDW: 14.9 % (ref 11.5–15.5)
WBC: 6.2 10*3/uL (ref 4.0–10.5)
nRBC: 0 % (ref 0.0–0.2)

## 2022-12-31 LAB — HEPATIC FUNCTION PANEL
ALT: 17 U/L (ref 0–44)
AST: 27 U/L (ref 15–41)
Albumin: 4.5 g/dL (ref 3.5–5.0)
Alkaline Phosphatase: 78 U/L (ref 38–126)
Bilirubin, Direct: 0.3 mg/dL — ABNORMAL HIGH (ref 0.0–0.2)
Indirect Bilirubin: 0.4 mg/dL (ref 0.3–0.9)
Total Bilirubin: 0.7 mg/dL (ref 0.3–1.2)
Total Protein: 8.2 g/dL — ABNORMAL HIGH (ref 6.5–8.1)

## 2022-12-31 LAB — TROPONIN I (HIGH SENSITIVITY)
Troponin I (High Sensitivity): 5 ng/L (ref <18)
Troponin I (High Sensitivity): 4 ng/L (ref <18)

## 2022-12-31 LAB — LIPASE, BLOOD: Lipase: 32 U/L (ref 11–51)

## 2022-12-31 MED ORDER — PANTOPRAZOLE SODIUM 40 MG PO TBEC: 40.0000 mg | DELAYED_RELEASE_TABLET | Freq: Every day | ORAL | 0 refills | Status: AC

## 2022-12-31 NOTE — ED Provider Notes (Signed)
[facility] Provider Note   CSN: [id number] Arrival date & time: 12/31/22  1548     History  Chief Complaint  Patient presents with   Chest Pain    Vincent Cantu is a 52 y.o. male.   Chest Pain  Patient has a history of hyperlipidemia, hypertension, diabetes, chronic kidney disease who presents to the ED with complaints of chest discomfort.  Patient states he started having symptoms this morning.  Its mostly on the right side but also somewhat in his upper abdomen and epigastric region.  He has not had any nausea vomiting.  No shortness of breath.  No leg swelling.  Patient denies any history of heart disease PE or DVT.  He has not noticed anything in particular that makes it better or worse.    Home Medications Prior to Admission medications   Medication Sig Start Date End Date Taking? Authorizing Provider  pantoprazole (PROTONIX) 40 MG tablet Take 1 tablet (40 mg total) by mouth daily. 12/31/22  Yes Dorie Rank, MD  amLODipine (NORVASC) 10 MG tablet Take 1 tablet (10 mg total) by mouth daily. 10/21/22   Denita Lung, MD  atorvastatin (LIPITOR) 20 MG tablet Take 1 tablet (20 mg total) by mouth daily. 10/21/22   Denita Lung, MD  Blood Glucose Monitoring Suppl DEVI Pt needs a one touch ultra meter 08/20/17   Denita Lung, MD  cetirizine (ZYRTEC) 10 MG tablet Take 10 mg by mouth at bedtime.    [provider]  Cholecalciferol (VITAMIN D3) 125 MCG (5000 UT) CAPS Take 5,000 Units by mouth daily.    [provider]  Continuous Blood Gluc Receiver (FREESTYLE LIBRE 3 READER) DEVI 1 each by Does not apply route daily. 12/26/22   Denita Lung, MD  Continuous Blood Gluc Sensor (FREESTYLE LIBRE 3 SENSOR) MISC Place 1 sensor on the skin every 14 days. Use to check glucose continuously 12/26/22   Denita Lung, MD  diclofenac (VOLTAREN) 75 MG EC tablet Take 1 tablet (75 mg total) by mouth 2 (two) times daily. 11/06/22   Wallene Huh, DPM  EDARBYCLOR 40-25 MG TABS Take 1 tablet by mouth daily. 06/25/22   Denita Lung, MD  GARLIC PO Take by mouth.    [provider]  glucose blood test strip Test once a day. Uses one touch ultra meter 08/20/17   Denita Lung, MD  Melatonin 10 MG TABS Take 10 mg by mouth at bedtime.    [provider]  meloxicam (MOBIC) 15 MG tablet Take 1 tablet (15 mg total) by mouth daily. 06/13/22   Hyatt, Max T, DPM  metFORMIN (GLUCOPHAGE) 1000 MG tablet Take 1 tablet (1,000 mg total) by mouth 2 (two) times daily. 09/27/22   Denita Lung, MD  metoprolol (TOPROL-XL) 200 MG 24 hr tablet Take 1 tablet (200 mg total) by mouth daily. 10/21/22   Denita Lung, MD  ondansetron (ZOFRAN) 4 MG tablet Take 1 tablet (4 mg total) by mouth every 6 (six) hours. 09/22/22   Rex Kras, PA  [facility] DELICA LANCETS FINE MISC Test once a day 08/20/17   Denita Lung, MD  OVER THE COUNTER MEDICATION Take 1 capsule by mouth daily. Sea Moss with Beet Root    [provider]  tadalafil (CIALIS) 20 MG tablet Take 1 tablet (20 mg total) by mouth daily as needed for erectile dysfunction. 06/25/22   Denita Lung, MD  terazosin (HYTRIN) 2 MG capsule TAKE 1 CAPSULE BY MOUTH EVERYDAY AT BEDTIME 09/27/22   Denita Lung, MD  tirzepatide [facility]) 7.5 MG/0.5ML Pen Inject 7.5 mg into the skin once a week. 10/14/22   Denita Lung, MD  traMADol (ULTRAM) 50 MG tablet Take 1-2 tablets (50-100 mg total) by mouth every 6 (six) hours as needed. 09/27/21   Coralie Keens, MD      Allergies    Patient has no known allergies.    Review of Systems   Review of Systems  Cardiovascular:  Positive for chest pain.    Physical Exam Updated Vital Signs BP (!) 137/96 (BP Location: Left Arm)   Pulse 65   Temp 98.3 F (36.8 C) (Oral)   Resp 15   Ht 1.905 m (6' 3"$ )   Wt (!) 144.2 kg   SpO2 100%   BMI 39.75 kg/m  Physical Exam Vitals and nursing note reviewed.  Constitutional:      Appearance:  He is well-developed. He is not ill-appearing or diaphoretic.  HENT:     Head: Normocephalic and atraumatic.     Right Ear: External ear normal.     Left Ear: External ear normal.  Eyes:     General: No scleral icterus.       Right eye: No discharge.        Left eye: No discharge.     Conjunctiva/sclera: Conjunctivae normal.  Neck:     Trachea: No tracheal deviation.  Cardiovascular:     Rate and Rhythm: Normal rate and regular rhythm.  Pulmonary:     Effort: Pulmonary effort is normal. No respiratory distress.     Breath sounds: Normal breath sounds. No stridor. No wheezing or rales.  Abdominal:     General: Bowel sounds are normal. There is no distension.     Palpations: Abdomen is soft.     Tenderness: There is abdominal tenderness in the right upper quadrant and epigastric area. There is no guarding or rebound.  Musculoskeletal:        General: No tenderness or deformity.     Cervical back: Neck supple.  Skin:    General: Skin is warm and dry.     Findings: No rash.  Neurological:     General: No focal deficit present.     Mental Status: He is alert.     Cranial Nerves: No cranial nerve deficit, dysarthria or facial asymmetry.     Sensory: No sensory deficit.     Motor: No abnormal muscle tone or seizure activity.     Coordination: Coordination normal.  Psychiatric:        Mood and Affect: Mood normal.     ED Results / Procedures / Treatments   Labs (all labs ordered are listed, but only abnormal results are displayed) Labs Reviewed  BASIC METABOLIC PANEL - Abnormal; Notable for the following components:      Result Value   Glucose, Bld 115 (*)    Creatinine, Ser 1.52 (*)    GFR, Estimated 54 (*)    All other components within normal limits  HEPATIC FUNCTION PANEL - Abnormal; Notable for the following components:   Total Protein 8.2 (*)    Bilirubin, Direct 0.3 (*)    All other components within normal limits  CBC  LIPASE, BLOOD  URINALYSIS, ROUTINE W REFLEX  MICROSCOPIC  TROPONIN I (HIGH SENSITIVITY)  TROPONIN I (HIGH SENSITIVITY)    EKG EKG Interpretation  Date/Time:  Tuesday December 31 2022  16:10:01 EST Ventricular Rate:  82 PR Interval:  190 QRS Duration: 110 QT Interval:  354 QTC Calculation: 413 R Axis:   -1 Text Interpretation: Normal sinus rhythm Septal infarct , age undetermined Abnormal ECG When compared with ECG of 22-Sep-2022 04:03, Septal infarct is now Present Inverted T waves have replaced nonspecific T wave abnormality in Inferior leads QT has shortened Confirmed by Dorie Rank [phone]) on 12/31/2022 4:19:14 PM  Radiology US Abdomen Limited RUQ (LIVER/GB)  Result Date: 12/31/2022 CLINICAL DATA:  Right upper quadrant pain and chest pain. EXAM: ULTRASOUND ABDOMEN LIMITED RIGHT UPPER QUADRANT COMPARISON:  None Available. FINDINGS: Gallbladder: No gallstones or wall thickening visualized (2.9 mm). No sonographic Murphy sign noted by sonographer. Common bile duct: Diameter: 5.6 mm Liver: An ill-defined hypoechoic area is seen within the right lobe of the liver, medial to the gallbladder fossa. No abnormal flow is seen within this region on color Doppler evaluation. Within normal limits in parenchymal echogenicity. Portal vein is patent on color Doppler imaging with normal direction of blood flow towards the liver. Other: None. IMPRESSION: 1. No evidence of cholelithiasis or acute cholecystitis. 2. Findings which may represent an area of focal fatty sparing within the right lobe of the liver, as described above. Correlation with nonemergent liver protocol abdomen and pelvis CT is recommended. Electronically Signed   By: Virgina Norfolk M.D.   On: 12/31/2022 20:03   DG Chest 2 View  Result Date: 12/31/2022 CLINICAL DATA:  Chest pain EXAM: CHEST - 2 VIEW COMPARISON:  Chest radiograph 09/22/2022. FINDINGS: The cardiomediastinal silhouette is normal. There is no focal consolidation or pulmonary edema. There is no pleural effusion or  pneumothorax There is no acute osseous abnormality. IMPRESSION: No radiographic evidence of acute cardiopulmonary process. Electronically Signed   By: Valetta Mole M.D.   On: 12/31/2022 17:00    Procedures Procedures    Medications Ordered in ED Medications - No data to display  ED Course/ Medical Decision Making/ A&P Clinical Course as of 12/31/22 2054  Tue Dec 31, 2022  [id number] Basic metabolic panel(!) Creatinine increased at 1.5, decreased compared to previous [JK]  1803 Hepatic function panel(!) LFTs normal [JK]  1803 CBC CBC normal [JK]  1803 Lipase, blood Lipase normal [JK]  1803 Troponin I (High Sensitivity) Troponin normal [JK]    Clinical Course User Index [JK] Dorie Rank, MD                             Medical Decision Making Frontal diagnosis includes but not limited to acute coronary syndrome, pneumonia, cholecystitis  Problems Addressed: Chest pain, unspecified type: acute illness or injury that poses a threat to life or bodily functions Hyperlipidemia associated with type 2 diabetes mellitus ([place]): chronic illness or injury Hypertension associated with diabetes [facility]): chronic illness or injury Type 2 diabetes with complication [facility]): chronic illness or injury  Amount and/or Complexity of Data Reviewed Labs: ordered. Decision-making details documented in ED Course. Radiology: ordered and independent interpretation performed.  Risk Prescription drug management.   Patient presented to ED for evaluation of chest discomfort.  Symptoms somewhat atypical for ACS but he does have cardiac risk factors.  ED workup reassuring.  Patient does not show any signs of acute cardiac ischemia.  EKG is atypical at baseline but actually looks better than previous.    Patient having some upper abdominal discomfort.  Ultrasound does not show any signs of gallstones.  Will plan on discharge home  with antacids but I do think considering his risk factors he would benefit from  outpatient cardiology referral.  Warning signs precautions discussed.  Evaluation and diagnostic testing in the emergency department does not suggest an emergent condition requiring admission or immediate intervention beyond what has been performed at this time.  The patient is safe for discharge and has been instructed to return immediately for worsening symptoms, change in symptoms or any other concerns.        Final Clinical Impression(s) / ED Diagnoses Final diagnoses:  Chest pain, unspecified type  Type 2 diabetes with complication ([place])  Hyperlipidemia associated with type 2 diabetes mellitus ([place])  Hypertension associated with diabetes ([place])    Rx / DC Orders ED Discharge Orders          Ordered    pantoprazole (PROTONIX) 40 MG tablet  Daily        12/31/22 2051    Ambulatory referral to Cardiology       Comments: If you have not heard from the Cardiology office within the next 72 hours please call [phone].   12/31/22 2051              Dorie Rank, MD 12/31/22 2054

## 2022-12-31 NOTE — ED Triage Notes (Signed)
Patient here POV from Home.  Endorses CP that began this AM. Mostly located to Right, Mid and Epigastric Area. Intermittent Aches.  No N/V. No SOB.   NAD Noted during Triage. A&Ox4. GCS 15. Ambulatory.

## 2022-12-31 NOTE — Discharge Instructions (Signed)
Take the medications as prescribed.  Return to the emergency room for worsening symptoms.  Follow-up with a cardiologist for further evaluation.  They should call you to schedule an appointment

## 2023-01-27 ENCOUNTER — Other Ambulatory Visit: Payer: Self-pay

## 2023-01-27 DIAGNOSIS — T8859XA Other complications of anesthesia, initial encounter: Secondary | ICD-10-CM | POA: Insufficient documentation

## 2023-01-27 DIAGNOSIS — I10 Essential (primary) hypertension: Secondary | ICD-10-CM | POA: Insufficient documentation

## 2023-01-27 DIAGNOSIS — M549 Dorsalgia, unspecified: Secondary | ICD-10-CM | POA: Insufficient documentation

## 2023-01-27 DIAGNOSIS — R079 Chest pain, unspecified: Secondary | ICD-10-CM | POA: Insufficient documentation

## 2023-01-27 DIAGNOSIS — E785 Hyperlipidemia, unspecified: Secondary | ICD-10-CM | POA: Insufficient documentation

## 2023-01-27 HISTORY — DX: Essential (primary) hypertension: I10

## 2023-02-06 ENCOUNTER — Telehealth (HOSPITAL_COMMUNITY): Payer: Self-pay | Admitting: *Deleted

## 2023-02-06 ENCOUNTER — Ambulatory Visit: Payer: Managed Care, Other (non HMO) | Attending: Cardiology | Admitting: Cardiology

## 2023-02-06 ENCOUNTER — Encounter: Payer: Self-pay | Admitting: Cardiology

## 2023-02-06 VITALS — BP 128/76 | HR 75 | Ht 75.0 in | Wt 322.0 lb

## 2023-02-06 DIAGNOSIS — I152 Hypertension secondary to endocrine disorders: Secondary | ICD-10-CM

## 2023-02-06 DIAGNOSIS — R197 Diarrhea, unspecified: Secondary | ICD-10-CM | POA: Insufficient documentation

## 2023-02-06 DIAGNOSIS — R300 Dysuria: Secondary | ICD-10-CM

## 2023-02-06 DIAGNOSIS — R1011 Right upper quadrant pain: Secondary | ICD-10-CM | POA: Insufficient documentation

## 2023-02-06 DIAGNOSIS — Z1211 Encounter for screening for malignant neoplasm of colon: Secondary | ICD-10-CM | POA: Insufficient documentation

## 2023-02-06 DIAGNOSIS — Z860100 Personal history of colon polyps, unspecified: Secondary | ICD-10-CM | POA: Insufficient documentation

## 2023-02-06 DIAGNOSIS — R195 Other fecal abnormalities: Secondary | ICD-10-CM | POA: Insufficient documentation

## 2023-02-06 DIAGNOSIS — R319 Hematuria, unspecified: Secondary | ICD-10-CM

## 2023-02-06 DIAGNOSIS — R509 Fever, unspecified: Secondary | ICD-10-CM | POA: Insufficient documentation

## 2023-02-06 DIAGNOSIS — E1159 Type 2 diabetes mellitus with other circulatory complications: Secondary | ICD-10-CM | POA: Diagnosis not present

## 2023-02-06 DIAGNOSIS — E1169 Type 2 diabetes mellitus with other specified complication: Secondary | ICD-10-CM | POA: Diagnosis not present

## 2023-02-06 DIAGNOSIS — I10 Essential (primary) hypertension: Secondary | ICD-10-CM

## 2023-02-06 DIAGNOSIS — K5730 Diverticulosis of large intestine without perforation or abscess without bleeding: Secondary | ICD-10-CM

## 2023-02-06 DIAGNOSIS — E785 Hyperlipidemia, unspecified: Secondary | ICD-10-CM

## 2023-02-06 DIAGNOSIS — E088 Diabetes mellitus due to underlying condition with unspecified complications: Secondary | ICD-10-CM | POA: Insufficient documentation

## 2023-02-06 DIAGNOSIS — R1012 Left upper quadrant pain: Secondary | ICD-10-CM | POA: Insufficient documentation

## 2023-02-06 DIAGNOSIS — R072 Precordial pain: Secondary | ICD-10-CM | POA: Diagnosis not present

## 2023-02-06 HISTORY — DX: Left upper quadrant pain: R10.12

## 2023-02-06 HISTORY — DX: Diabetes mellitus due to underlying condition with unspecified complications: E08.8

## 2023-02-06 HISTORY — DX: Other fecal abnormalities: R19.5

## 2023-02-06 HISTORY — DX: Hematuria, unspecified: R31.9

## 2023-02-06 HISTORY — DX: Diverticulosis of large intestine without perforation or abscess without bleeding: K57.30

## 2023-02-06 HISTORY — DX: Dysuria: R30.0

## 2023-02-06 HISTORY — DX: Diarrhea, unspecified: R19.7

## 2023-02-06 HISTORY — DX: Personal history of colon polyps, unspecified: Z86.0100

## 2023-02-06 HISTORY — DX: Encounter for screening for malignant neoplasm of colon: Z12.11

## 2023-02-06 HISTORY — DX: Personal history of colonic polyps: Z86.010

## 2023-02-06 HISTORY — DX: Right upper quadrant pain: R10.11

## 2023-02-06 HISTORY — DX: Fever, unspecified: R50.9

## 2023-02-06 NOTE — Telephone Encounter (Signed)
Patient given detailed instructions per Myocardial Perfusion Study Information Sheet for the test on 02/11/23 at 8:15. Patient notified to arrive 15 minutes early and that it is imperative to arrive on time for appointment to keep from having the test rescheduled.  If you need to cancel or reschedule your appointment, please call the office within 24 hours of your appointment. . Patient verbalized understanding.Vincent Cantu

## 2023-02-06 NOTE — Progress Notes (Signed)
Cardiology Office Note:    Date:  02/06/2023   ID:  [name], DOB [birth year], MRN [id number]  PCP:  Denita Lung, MD  Cardiologist:  Jenean Lindau, MD   Referring MD: Dorie Rank, MD    ASSESSMENT:    1. Essential hypertension   2. Hypertension associated with diabetes ([place])   3. Hyperlipidemia associated with type 2 diabetes mellitus ([place])   4. Precordial pain   5. Diabetes mellitus due to underlying condition with unspecified complications ([place])    PLAN:    In order of problems listed above:  Primary prevention stressed with the patient.  Importance of compliance with diet medication stressed any vocalized understanding. Chest pain: Patient is concerned about this because of risk factors.  I would like to reassure him by doing an exercise stress Cardiolite.  He is agreeable. Essential hypertension: Blood pressure stable and diet was emphasized.  Salt intake issues were discussed. Mixed dyslipidemia: On lipid-lowering medications followed by primary care. Diabetes mellitus: Managed by primary care.  Diet emphasized. Morbid obesity: Risks explained weight reduction stressed and he promises to do better.  Lifestyle modification urged. Cardiac murmur: Echo will be done to assess murmur heard on auscultation. Patient will be seen in follow-up appointment in 6 months or earlier if the patient has any concerns.    Medication Adjustments/Labs and Tests Ordered: Current medicines are reviewed at length with the patient today.  Concerns regarding medicines are outlined above.  Orders Placed This Encounter  Procedures   MYOCARDIAL PERFUSION IMAGING   EKG 12-Lead   No orders of the defined types were placed in this encounter.    History of Present Illness:    Vincent Cantu is a 52 y.o. male who is being seen today for the evaluation of chest pain at the request of Dorie Rank, MD. patient is a pleasant 52 year old male.  He has past medical history of essential  hypertension, dyslipidemia, diabetes mellitus and obesity.  Patient mentions to me that he has discomfort in his upper chest and shoulder at times.  He also has orthopedic issues involving both shoulders.  No orthopnea or PND.  He is morbidly obese.  He mentions to me that he walks about 30 minutes a day almost 5 times a week without any symptoms.  He is concerned about his chest symptoms especially in view of his risk factors and therefore he has been referred here by his primary care.  At the time of my evaluation, the patient is alert awake oriented and in no distress.  Past Medical History:  Diagnosis Date   Adenomatous polyp of colon 01/21/2019   Allergic rhinitis 06/22/2013   Arthritis 03/24/2018   Back pain    Chest pain    Chronic pain of left knee 12/01/2020   CKD stage 3 due to type 2 diabetes mellitus (Vincent Cantu) [id number]   Complication of anesthesia    Per pt's wife trouble waking after previous surgery   Diabetes mellitus without complication (Vincent Cantu) [id number]   History of COVID-19 06/19/2021   June 2022   Hyperlipidemia    Hyperlipidemia associated with type 2 diabetes mellitus ([place]) 06/11/2007   Qualifier: Diagnosis of   By: Jimmye Norman, LPN, [place]      Hypertension    Hypertension associated with diabetes ([place]) 06/11/2007   Qualifier: Diagnosis of   By: Jimmye Norman, LPN, [place] Cunas      Morbid obesity ([place]) 06/22/2013   Type 2 diabetes with complication (Vincent Cantu) [id number]  Past Surgical History:  Procedure Laterality Date   arthroscopic to knee for meniscus tear     INCISIONAL HERNIA REPAIR N/A 09/26/2021   Procedure: LAPAROSCOPIC  INCISIONAL HERNIA REPAIR;  Surgeon: Coralie Keens, MD;  Location: [place];  Service: General;  Laterality: N/A;   INSERTION OF MESH N/A 09/26/2021   Procedure: INSERTION OF MESH;  Surgeon: Coralie Keens, MD;  Location: [facility];  Service: General;  Laterality: N/A;   LUMBAR FUSION     LUMBAR LAMINECTOMY     umbilical hernia repair with mesh x 2      VENTRAL HERNIA REPAIR N/A 08/28/2014   Procedure: HERNIA REPAIR VENTRAL ADULT;  Surgeon: Doreen Salvage, MD;  Location: [facility];  Service: General;  Laterality: N/A;    Current Medications: Current Meds  Medication Sig   amLODipine (NORVASC) 10 MG tablet Take 1 tablet (10 mg total) by mouth daily.   atorvastatin (LIPITOR) 20 MG tablet Take 1 tablet (20 mg total) by mouth daily.   Blood Glucose Monitoring Suppl DEVI Pt needs a one touch ultra meter   cetirizine (ZYRTEC) 10 MG tablet Take 10 mg by mouth at bedtime.   Cholecalciferol (VITAMIN D3) 125 MCG (5000 UT) CAPS Take 5,000 Units by mouth daily.   Continuous Blood Gluc Receiver (FREESTYLE LIBRE 3 READER) DEVI 1 each by Does not apply route daily.   Continuous Blood Gluc Sensor (FREESTYLE LIBRE 3 SENSOR) MISC Place 1 sensor on the skin every 14 days. Use to check glucose continuously   diclofenac (VOLTAREN) 75 MG EC tablet Take 1 tablet (75 mg total) by mouth 2 (two) times daily.   EDARBYCLOR 40-25 MG TABS Take 1 tablet by mouth daily.   GARLIC PO Take 1 capsule by mouth daily.   glucose blood test strip Test once a day. Uses one touch ultra meter   Melatonin 10 MG TABS Take 10 mg by mouth at bedtime.   meloxicam (MOBIC) 15 MG tablet Take 1 tablet (15 mg total) by mouth daily.   metFORMIN (GLUCOPHAGE) 1000 MG tablet Take 1 tablet (1,000 mg total) by mouth 2 (two) times daily.   metoprolol (TOPROL-XL) 200 MG 24 hr tablet Take 1 tablet (200 mg total) by mouth daily.   ondansetron (ZOFRAN) 4 MG tablet Take 1 tablet (4 mg total) by mouth every 6 (six) hours.   ONETOUCH DELICA LANCETS FINE MISC Test once a day   OVER THE COUNTER MEDICATION Take 1 capsule by mouth daily. Sea Moss with Beet Root   pantoprazole (PROTONIX) 40 MG tablet Take 1 tablet (40 mg total) by mouth daily.   tadalafil (CIALIS) 20 MG tablet Take 1 tablet (20 mg total) by mouth daily as needed for erectile dysfunction.   terazosin (HYTRIN) 2 MG capsule TAKE 1 CAPSULE BY MOUTH  EVERYDAY AT BEDTIME   tirzepatide (MOUNJARO) 7.5 MG/0.5ML Pen Inject 7.5 mg into the skin once a week.   traMADol (ULTRAM) 50 MG tablet Take 1-2 tablets (50-100 mg total) by mouth every 6 (six) hours as needed.     Allergies:   Patient has no known allergies.   Social History   Socioeconomic History   Marital status: Married    Spouse name: Katrina   Number of children: 5   Years of education: Not on file   Highest education level: Not on file  Occupational History   Not on file  Tobacco Use   Smoking status: Never   Smokeless tobacco: Never  Vaping Use   Vaping Use: Never  used  Substance and Sexual Activity   Alcohol use: No    Alcohol/week: 0.0 standard drinks of alcohol   Drug use: No   Sexual activity: Yes  Other Topics Concern   Not on file  Social History Narrative      Social Determinants of Health   Financial Resource Strain: Not on file  Food Insecurity: Not on file  Transportation Needs: Not on file  Physical Activity: Not on file  Stress: Not on file  Social Connections: Not on file     Family History: The patient's family history includes Alcohol abuse in his father; Diabetes in his maternal grandmother, mother, and another family member; Hypertension in his father, mother, and another family member; Seizures in an other family member.  ROS:   Please see the history of present illness.    All other systems reviewed and are negative.  EKGs/Labs/Other Studies Reviewed:    The following studies were reviewed today: EKG reveals sinus rhythm and nonspecific ST-T changes   Recent Labs: 12/31/2022: ALT 17; BUN 17; Creatinine, Ser 1.52; Hemoglobin 13.1; Platelets 258; Potassium 4.1; Sodium 138  Recent Lipid Panel    Component Value Date/Time   CHOL 141 06/25/2022 0929   TRIG 79 06/25/2022 0929   HDL 37 (L) 06/25/2022 0929   CHOLHDL 3.8 06/25/2022 0929   CHOLHDL 4.9 07/30/2017 1046   VLDL 31 (H) 07/10/2016 0001   LDLCALC 89 06/25/2022 0929    LDLCALC 91 07/30/2017 1046   LDLDIRECT 103.5 01/20/2012 0843    Physical Exam:    VS:  BP 128/76   Pulse 75   Ht 6\' 3"  (1.905 m)   Wt (!) 322 lb (146.1 kg)   SpO2 98%   BMI 40.25 kg/m     Wt Readings from Last 3 Encounters:  02/06/23 (!) 322 lb (146.1 kg)  12/31/22 (!) 318 lb (144.2 kg)  12/26/22 (!) 319 lb 12.8 oz (145.1 kg)     GEN: Patient is in no acute distress HEENT: Normal NECK: No JVD; No carotid bruits LYMPHATICS: No lymphadenopathy CARDIAC: S1 S2 regular, 2/6 systolic murmur at the apex. RESPIRATORY:  Clear to auscultation without rales, wheezing or rhonchi  ABDOMEN: Soft, non-tender, non-distended MUSCULOSKELETAL:  No edema; No deformity  SKIN: Warm and dry NEUROLOGIC:  Alert and oriented x 3 PSYCHIATRIC:  Normal affect    Signed, Jenean Lindau, MD  02/06/2023 11:12 AM    [place]

## 2023-02-06 NOTE — Patient Instructions (Addendum)
Medication Instructions:  Your physician recommends that you continue on your current medications as directed. Please refer to the Current Medication list given to you today.  *If you need a refill on your cardiac medications before your next appointment, please call your pharmacy*   Lab Work: None ordered If you have labs (blood work) drawn today and your tests are completely normal, you will receive your results only by: [place] (if you have MyChart) OR A paper copy in the mail If you have any lab test that is abnormal or we need to change your treatment, we will call you to review the results.   Testing/Procedures: You are scheduled for a Myocardial Perfusion Imaging Study.  Please arrive 15 minutes prior to your appointment time for registration and insurance purposes.  The test will take approximately 3 to 4 hours to complete; you may bring reading material.  If someone comes with you to your appointment, they will need to remain in the main lobby due to limited space in the testing area.   How to prepare for your Myocardial Perfusion Test: Do not eat or drink 3 hours prior to your test, except you may have water. Do not consume products containing caffeine (regular or decaffeinated) 12 hours prior to your test. (ex: coffee, chocolate, sodas, tea). Do bring a list of your current medications with you.  If not listed below, you may take your medications as normal. Do not take metoprolol (Lopressor, Toprol) for 24 hours prior to the test.  Bring the medication to your appointment as you may be required to take it once the test is complete. Do wear comfortable clothes (no dresses or overalls) and walking shoes, tennis shoes preferred (No heels or open toe shoes are allowed). Do NOT wear cologne, perfume, aftershave, or lotions (deodorant is allowed). If these instructions are not followed, your test will have to be rescheduled.  If you cannot keep your appointment, please  provide 24 hours notification to the Nuclear Lab, to avoid a possible $50 charge to your account.  Follow-Up: At [facility], you and your health needs are our priority.  As part of our continuing mission to provide you with exceptional heart care, we have created designated Provider Care Teams.  These Care Teams include your primary Cardiologist (physician) and Advanced Practice Providers (APPs -  Physician Assistants and Nurse Practitioners) who all work together to provide you with the care you need, when you need it.  We recommend signing up for the patient portal called "MyChart".  Sign up information is provided on this After Visit Summary.  MyChart is used to connect with patients for Virtual Visits (Telemedicine).  Patients are able to view lab/test results, encounter notes, upcoming appointments, etc.  Non-urgent messages can be sent to your provider as well.   To learn more about what you can do with MyChart, go to [email or web address].     Your physician has requested that you have an echocardiogram. Echocardiography is a painless test that uses sound waves to create images of your heart. It provides your doctor with information about the size and shape of your heart and how well your heart's chambers and valves are working. This procedure takes approximately one hour. There are no restrictions for this procedure. Please do NOT wear cologne, perfume, aftershave, or lotions (deodorant is allowed). Please arrive 15 minutes prior to your appointment time.  Your next appointment:   12 month(s)  Provider:   Jyl Heinz, MD   Other  Instructions  Cardiac Nuclear Scan A cardiac nuclear scan is a test that is done to check the flow of blood to your heart. It is done when you are resting and when you are exercising. The test looks for problems such as: Not enough blood reaching a portion of the heart. The heart muscle not working as it should. You may need this test if you  have: Heart disease. Lab results that are not normal. Had heart surgery or a balloon procedure to open up blocked arteries (angioplasty) or a small mesh tube (stent). Chest pain. Shortness of breath. Had a heart attack. In this test, a special dye (tracer) is put into your bloodstream. The tracer will travel to your heart. A camera will then take pictures of your heart to see how the tracer moves through your heart. This test is usually done at a hospital and takes 2-4 hours. Tell a doctor about: Any allergies you have. All medicines you are taking, including vitamins, herbs, eye drops, creams, and over-the-counter medicines. Any bleeding problems you have. Any surgeries you have had. Any medical conditions you have. Whether you are pregnant or may be pregnant. Any history of asthma or long-term (chronic) lung disease. Any history of heart rhythm disorders or heart valve conditions. What are the risks? Your doctor will talk with you about risks. These may include: Serious chest pain and heart attack. This is only a risk if the stress portion of the test is done. Fast or uneven heartbeats (palpitations). A feeling of warmth in your chest. This feeling usually does not last long. Allergic reaction to the tracer. Shortness of breath or trouble breathing. What happens before the test? Ask your doctor about changing or stopping your normal medicines. Follow instructions from your doctor about what you cannot eat or drink. Remove your jewelry on the day of the test. Ask your doctor if you need to avoid nicotine or caffeine. What happens during the test? An IV tube will be inserted into one of your veins. Your doctor will give you a small amount of tracer through the IV tube. You will wait for 20-40 minutes while the tracer moves through your bloodstream. Your heart will be monitored with an electrocardiogram (ECG). You will lie down on an exam table. Pictures of your heart will be taken  for about 15-20 minutes. You may also have a stress test. For this test, one of these things may be done: You will be asked to exercise on a treadmill or a stationary bike. You will be given medicines that will make your heart work harder. This is done if you are unable to exercise. When blood flow to your heart has peaked, a tracer will again be given through the IV tube. After 20-40 minutes, you will get back on the exam table. More pictures will be taken of your heart. Depending on the tracer that is used, more pictures may need to be taken 3-4 hours later. Your IV tube will be removed when the test is over. The test may vary among doctors and hospitals. What happens after the test? Ask your doctor: Whether you can return to your normal schedule, including diet, activities, travel, and medicines. Whether you should drink more fluids. This will help to remove the tracer from your body. Ask your doctor, or the department that is doing the test: When will my results be ready? How will I get my results? What are my treatment options? What other tests do I need? What are  my next steps? This information is not intended to replace advice given to you by your health care provider. Make sure you discuss any questions you have with your health care provider. Document Revised: 04/02/2022 Document Reviewed: 04/02/2022 Elsevier Patient Education  [place].  Echocardiogram An echocardiogram is a test that uses sound waves (ultrasound) to produce images of the heart. Images from an echocardiogram can provide important information about: Heart size and shape. The size and thickness and movement of your heart's walls. Heart muscle function and strength. Heart valve function or if you have stenosis. Stenosis is when the heart valves are too narrow. If blood is flowing backward through the heart valves (regurgitation). A tumor or infectious growth around the heart valves. Areas of heart  muscle that are not working well because of poor blood flow or injury from a heart attack. Aneurysm detection. An aneurysm is a weak or damaged part of an artery wall. The wall bulges out from the normal force of blood pumping through the body. Tell a health care provider about: Any allergies you have. All medicines you are taking, including vitamins, herbs, eye drops, creams, and over-the-counter medicines. Any blood disorders you have. Any surgeries you have had. Any medical conditions you have. Whether you are pregnant or may be pregnant. What are the risks? Generally, this is a safe test. However, problems may occur, including an allergic reaction to dye (contrast) that may be used during the test. What happens before the test? No specific preparation is needed. You may eat and drink normally. What happens during the test?  You will take off your clothes from the waist up and put on a hospital gown. Electrodes or electrocardiogram (ECG)patches may be placed on your chest. The electrodes or patches are then connected to a device that monitors your heart rate and rhythm. You will lie down on a table for an ultrasound exam. A gel will be applied to your chest to help sound waves pass through your skin. A handheld device, called a transducer, will be pressed against your chest and moved over your heart. The transducer produces sound waves that travel to your heart and bounce back (or "echo" back) to the transducer. These sound waves will be captured in real-time and changed into images of your heart that can be viewed on a video monitor. The images will be recorded on a computer and reviewed by your health care provider. You may be asked to change positions or hold your breath for a short time. This makes it easier to get different views or better views of your heart. In some cases, you may receive contrast through an IV in one of your veins. This can improve the quality of the pictures from your  heart. The procedure may vary among health care providers and hospitals. What can I expect after the test? You may return to your normal, everyday life, including diet, activities, and medicines, unless your health care provider tells you not to do that. Follow these instructions at home: It is up to you to get the results of your test. Ask your health care provider, or the department that is doing the test, when your results will be ready. Keep all follow-up visits. This is important. Summary An echocardiogram is a test that uses sound waves (ultrasound) to produce images of the heart. Images from an echocardiogram can provide important information about the size and shape of your heart, heart muscle function, heart valve function, and other possible heart  problems. You do not need to do anything to prepare before this test. You may eat and drink normally. After the echocardiogram is completed, you may return to your normal, everyday life, unless your health care provider tells you not to do that. This information is not intended to replace advice given to you by your health care provider. Make sure you discuss any questions you have with your health care provider. Document Revised: 07/18/2021 Document Reviewed: 06/27/2020 Elsevier Patient Education  [place].

## 2023-02-11 ENCOUNTER — Ambulatory Visit (HOSPITAL_COMMUNITY): Payer: Managed Care, Other (non HMO) | Attending: Cardiology

## 2023-02-11 DIAGNOSIS — R072 Precordial pain: Secondary | ICD-10-CM | POA: Insufficient documentation

## 2023-02-11 MED ORDER — TECHNETIUM TC 99M TETROFOSMIN IV KIT
32.8000 | PACK | Freq: Once | INTRAVENOUS | Status: AC | PRN
  Administered 2023-02-11: 32.8 via INTRAVENOUS

## 2023-02-11 MED ORDER — REGADENOSON 0.4 MG/5ML IV SOLN
0.4000 mg | Freq: Once | INTRAVENOUS | Status: AC
  Administered 2023-02-11: 0.4 mg via INTRAVENOUS

## 2023-02-12 ENCOUNTER — Ambulatory Visit (HOSPITAL_COMMUNITY): Payer: Managed Care, Other (non HMO) | Attending: Cardiology

## 2023-02-12 LAB — MYOCARDIAL PERFUSION IMAGING
LV dias vol: 77 mL (ref 62–150)
LV sys vol: 27 mL
Nuc Stress EF: 65 %
Peak HR: 137 {beats}/min
Rest HR: 118 {beats}/min
Rest Nuclear Isotope Dose: 31.8 mCi
SDS: 2
SRS: 0
SSS: 2
ST Depression (mm): 0 mm
Stress Nuclear Isotope Dose: 32.8 mCi
TID: 0.71

## 2023-02-12 MED ORDER — TECHNETIUM TC 99M TETROFOSMIN IV KIT
31.8000 | PACK | Freq: Once | INTRAVENOUS | Status: AC | PRN
  Administered 2023-02-12: 31.8 via INTRAVENOUS

## 2023-02-13 ENCOUNTER — Telehealth: Payer: Self-pay

## 2023-02-13 MED ORDER — ASPIRIN 81 MG PO TBEC: 81.0000 mg | DELAYED_RELEASE_TABLET | Freq: Every day | ORAL | 3 refills | Status: AC

## 2023-02-13 MED ORDER — NITROGLYCERIN 0.4 MG SL SUBL: 0.4000 mg | SUBLINGUAL_TABLET | SUBLINGUAL | 6 refills | Status: DC | PRN

## 2023-02-13 NOTE — Telephone Encounter (Signed)
-----   Message from Jenean Lindau, MD sent at 02/13/2023 11:28 AM EDT ----- Make sure patient has aspirin and nitroglycerin and set up an appointment to see me.  Copy primary care Jenean Lindau, MD 02/13/2023 11:28 AM

## 2023-02-19 ENCOUNTER — Other Ambulatory Visit: Payer: Self-pay

## 2023-02-19 DIAGNOSIS — I10 Essential (primary) hypertension: Secondary | ICD-10-CM | POA: Insufficient documentation

## 2023-02-20 ENCOUNTER — Ambulatory Visit: Payer: Managed Care, Other (non HMO) | Attending: Cardiology | Admitting: Cardiology

## 2023-02-20 ENCOUNTER — Encounter: Payer: Self-pay | Admitting: Cardiology

## 2023-02-20 VITALS — BP 130/82 | HR 87 | Ht 75.0 in | Wt 325.0 lb

## 2023-02-20 DIAGNOSIS — I10 Essential (primary) hypertension: Secondary | ICD-10-CM | POA: Diagnosis not present

## 2023-02-20 DIAGNOSIS — E088 Diabetes mellitus due to underlying condition with unspecified complications: Secondary | ICD-10-CM

## 2023-02-20 DIAGNOSIS — E782 Mixed hyperlipidemia: Secondary | ICD-10-CM | POA: Diagnosis not present

## 2023-02-20 NOTE — Progress Notes (Signed)
Cardiology Office Note:    Date:  02/20/2023   ID:  Marguarite Arbour, DOB [birth year], MRN [id number]  PCP:  Denita Lung, MD  Cardiologist:  Jenean Lindau, MD   Referring MD: Denita Lung, MD    ASSESSMENT:    1. Essential hypertension   2. Diabetes mellitus due to underlying condition with unspecified complications   3. Morbid obesity ([place])   4. Mixed hyperlipidemia    PLAN:    In order of problems listed above:  Primary prevention stressed with the patient.  Importance of compliance with diet medication stressed and vocalized understanding. Mildly abnormal nuclear stress test: Options were given to the patient.  He has excellent effort tolerance.  I gave him option of medical therapy, CT coronary angiography and conventional coronary angiography.  He prefers the first option and wants to go the medical route I respect his wishes.  Pros and cons of each modality were discussed and questions were answered to his satisfaction.  His wife accompanied him for this visit and had questions 2. Essential hypertension: Blood pressure stable and diet was emphasized.  Lifestyle modification urged. Mixed dyslipidemia: On lipid-lowering medications and will have blood work today fasting. Diabetes mellitus and obesity.  Managed by primary care.  Risks of obesity explained and he promises to do better. Patient will be seen in follow-up appointment in 6 months or earlier if the patient has any concerns.    Medication Adjustments/Labs and Tests Ordered: Current medicines are reviewed at length with the patient today.  Concerns regarding medicines are outlined above.  No orders of the defined types were placed in this encounter.  No orders of the defined types were placed in this encounter.    No chief complaint on file.    History of Present Illness:    AHSAN SALOM is a 52 y.o. male.  Patient has past medical history of essential hypertension, mixed dyslipidemia, diabetes  mellitus and obesity.  He has renal insufficiency.  He has a mildly abnormal nuclear stress test for which she is here for appointment.  He mentions to me that he walks about 40 minutes a day without any problems.  No chest pain orthopnea or PND.  At the time of my evaluation, the patient is alert awake oriented and in no distress.  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   Dysuria 02/06/2023   Essential hypertension 01/27/2023   Feces contents abnormal 02/06/2023   Fever 02/06/2023   Hematuria 02/06/2023   History of COVID-19 06/19/2021   June 2022   Hyperlipidemia    Hyperlipidemia associated with type 2 diabetes mellitus 06/11/2007   Qualifier: Diagnosis of   By: Jimmye Norman, LPN, [place]      Hypertension    Hypertension associated with diabetes 06/11/2007   Qualifier: Diagnosis of   By: Jimmye Norman, LPN, [place]      Left upper quadrant pain 02/06/2023   Morbid obesity 06/22/2013   Personal  history of colonic polyps 02/06/2023   Right upper quadrant pain 02/06/2023   Screening for malignant neoplasm of colon 02/06/2023   Type 2 diabetes with complication [id number]    Past Surgical History:  Procedure Laterality Date   arthroscopic to knee for meniscus tear     INCISIONAL HERNIA REPAIR N/A 09/26/2021   Procedure: LAPAROSCOPIC  INCISIONAL HERNIA REPAIR;  Surgeon: Coralie Keens, MD;  Location: [facility];  Service: General;  Laterality: N/A;   INSERTION OF MESH N/A 09/26/2021    Procedure: INSERTION OF MESH;  Surgeon: Coralie Keens, MD;  Location: [facility];  Service: General;  Laterality: N/A;   LUMBAR FUSION     LUMBAR LAMINECTOMY     umbilical hernia repair with mesh x 2     VENTRAL HERNIA REPAIR N/A 08/28/2014   Procedure: HERNIA REPAIR VENTRAL ADULT;  Surgeon: Doreen Salvage, MD;  Location: [facility];  Service: General;  Laterality: N/A;    Current Medications: Current Meds  Medication Sig   amLODipine (NORVASC) 10 MG tablet Take 1 tablet (10 mg total) by mouth daily.   aspirin EC 81 MG tablet Take 1 tablet (81 mg total) by mouth daily. Swallow whole.   atorvastatin (LIPITOR) 20 MG tablet Take 1 tablet (20 mg total) by mouth daily.   Blood Glucose Monitoring Suppl DEVI Pt needs a one touch ultra meter   cetirizine (ZYRTEC) 10 MG tablet Take 10 mg by mouth at bedtime.   Cholecalciferol (VITAMIN D3) 125 MCG (5000 UT) CAPS Take 5,000 Units by mouth daily.   Continuous Blood Gluc Receiver (FREESTYLE LIBRE 3 READER) DEVI 1 each by Does not apply route daily.   Continuous Blood Gluc Sensor (FREESTYLE LIBRE 3 SENSOR) MISC Place 1 sensor on the skin every 14 days. Use to check glucose continuously   diclofenac (VOLTAREN) 75 MG EC tablet Take 1 tablet (75 mg total) by mouth 2 (two) times daily.   EDARBYCLOR 40-25 MG TABS Take 1 tablet by mouth daily.   GARLIC PO Take 1 capsule by mouth daily.   glucose blood test strip Test once a day. Uses one touch ultra meter   Melatonin 10 MG TABS Take 10 mg by mouth at bedtime.   meloxicam (MOBIC) 15 MG tablet Take 1 tablet (15 mg total) by mouth daily.   metFORMIN (GLUCOPHAGE) 1000 MG tablet Take 1 tablet (1,000 mg total) by mouth 2 (two) times daily.   metoprolol (TOPROL-XL) 200 MG 24 hr tablet Take 1 tablet (200 mg total) by mouth daily.   nitroGLYCERIN (NITROSTAT) 0.4 MG SL tablet Place 0.4 mg under the tongue every 5 (five) minutes as needed for chest pain.   ondansetron (ZOFRAN) 4 MG tablet Take 1 tablet (4 mg total) by mouth every  6 (six) hours.   ONETOUCH DELICA LANCETS FINE MISC Test once a day   OVER THE COUNTER MEDICATION Take 1 capsule by mouth daily. Sea Moss with Beet Root   pantoprazole (PROTONIX) 40 MG tablet Take 1 tablet (40 mg total) by mouth daily.   tadalafil (CIALIS) 20 MG tablet Take 1 tablet (20 mg total) by mouth daily as needed for erectile dysfunction.   terazosin (HYTRIN) 2 MG capsule TAKE 1 CAPSULE BY MOUTH EVERYDAY AT BEDTIME   tirzepatide (MOUNJARO) 7.5 MG/0.5ML Pen Inject 7.5 mg into the skin once a week.   traMADol (ULTRAM) 50 MG tablet Take 1-2 tablets (50-100 mg total) by mouth every 6 (six) hours as needed.     Allergies:   Patient has  no known allergies.   Social History   Socioeconomic History   Marital status: Married    Spouse name: Katrina   Number of children: 5   Years of education: Not on file   Highest education level: Not on file  Occupational History   Not on file  Tobacco Use   Smoking status: Never   Smokeless tobacco: Never  Vaping Use   Vaping Use: Never used  Substance and Sexual Activity   Alcohol use: No    Alcohol/week: 0.0 standard drinks of alcohol   Drug use: No   Sexual activity: Yes  Other Topics Concern   Not on file  Social History Narrative      Social Determinants of Health   Financial Resource Strain: Not on file  Food Insecurity: Not on file  Transportation Needs: Not on file  Physical Activity: Not on file  Stress: Not on file  Social Connections: Not on file     Family History: The patient's family history includes Alcohol abuse in his father; Diabetes in his maternal grandmother, mother, and another family member; Hypertension in his father, mother, and another family member; Seizures in an other family member.  ROS:   Please see the history of present illness.    All other systems reviewed and are negative.  EKGs/Labs/Other Studies Reviewed:    The following studies were reviewed today: EKG revealed sinus rhythm and  nonspecific ST-T changes   Recent Labs: 12/31/2022: ALT 17; BUN 17; Creatinine, Ser 1.52; Hemoglobin 13.1; Platelets 258; Potassium 4.1; Sodium 138  Recent Lipid Panel    Component Value Date/Time   CHOL 141 06/25/2022 0929   TRIG 79 06/25/2022 0929   HDL 37 (L) 06/25/2022 0929   CHOLHDL 3.8 06/25/2022 0929   CHOLHDL 4.9 07/30/2017 1046   VLDL 31 (H) 07/10/2016 0001   LDLCALC 89 06/25/2022 0929   LDLCALC 91 07/30/2017 1046   LDLDIRECT 103.5 01/20/2012 0843    Physical Exam:    VS:  BP 130/82   Pulse 87   Ht 6\' 3"  (1.905 m)   Wt (!) 325 lb (147.4 kg)   SpO2 97%   BMI 40.62 kg/m     Wt Readings from Last 3 Encounters:  02/20/23 (!) 325 lb (147.4 kg)  02/06/23 (!) 322 lb (146.1 kg)  12/31/22 (!) 318 lb (144.2 kg)     GEN: Patient is in no acute distress HEENT: Normal NECK: No JVD; No carotid bruits LYMPHATICS: No lymphadenopathy CARDIAC: Hear sounds regular, 2/6 systolic murmur at the apex. RESPIRATORY:  Clear to auscultation without rales, wheezing or rhonchi  ABDOMEN: Soft, non-tender, non-distended MUSCULOSKELETAL:  No edema; No deformity  SKIN: Warm and dry NEUROLOGIC:  Alert and oriented x 3 PSYCHIATRIC:  Normal affect   Signed, Jenean Lindau, MD  02/20/2023 8:44 AM    [place]

## 2023-02-20 NOTE — Patient Instructions (Signed)
Medication Instructions:  Your physician recommends that you continue on your current medications as directed. Please refer to the Current Medication list given to you today.  *If you need a refill on your cardiac medications before your next appointment, please call your pharmacy*   Lab Work: Your physician recommends that you have a CMP and lipids done today in the office.  If you have labs (blood work) drawn today and your tests are completely normal, you will receive your results only by: [place] (if you have MyChart) OR A paper copy in the mail If you have any lab test that is abnormal or we need to change your treatment, we will call you to review the results.   Testing/Procedures: None ordered   Follow-Up: At [facility], you and your health needs are our priority.  As part of our continuing mission to provide you with exceptional heart care, we have created designated Provider Care Teams.  These Care Teams include your primary Cardiologist (physician) and Advanced Practice Providers (APPs -  Physician Assistants and Nurse Practitioners) who all work together to provide you with the care you need, when you need it.  We recommend signing up for the patient portal called "MyChart".  Sign up information is provided on this After Visit Summary.  MyChart is used to connect with patients for Virtual Visits (Telemedicine).  Patients are able to view lab/test results, encounter notes, upcoming appointments, etc.  Non-urgent messages can be sent to your provider as well.   To learn more about what you can do with MyChart, go to [email or web address].    Your next appointment:   6 month(s)  The format for your next appointment:   In Person  Provider:   Jyl Heinz, MD    Other Instructions none  Important Information About Sugar

## 2023-02-21 LAB — COMPREHENSIVE METABOLIC PANEL
ALT: 21 IU/L (ref 0–44)
AST: 19 IU/L (ref 0–40)
Albumin/Globulin Ratio: 1.2 (ref 1.2–2.2)
Albumin: 4.4 g/dL (ref 3.8–4.9)
Alkaline Phosphatase: 118 IU/L (ref 44–121)
BUN/Creatinine Ratio: 10 (ref 9–20)
BUN: 16 mg/dL (ref 6–24)
Bilirubin Total: 0.5 mg/dL (ref 0.0–1.2)
CO2: 22 mmol/L (ref 20–29)
Calcium: 9.9 mg/dL (ref 8.7–10.2)
Chloride: 101 mmol/L (ref 96–106)
Creatinine, Ser: 1.62 mg/dL — ABNORMAL HIGH (ref 0.76–1.27)
Globulin, Total: 3.6 g/dL (ref 1.5–4.5)
Glucose: 123 mg/dL — ABNORMAL HIGH (ref 70–99)
Potassium: 3.9 mmol/L (ref 3.5–5.2)
Sodium: 140 mmol/L (ref 134–144)
Total Protein: 8 g/dL (ref 6.0–8.5)
eGFR: 50 mL/min/{1.73_m2} — ABNORMAL LOW (ref >59)

## 2023-02-21 LAB — LIPID PANEL
Chol/HDL Ratio: 4.4 ratio (ref 0.0–5.0)
Cholesterol, Total: 142 mg/dL (ref 100–199)
HDL: 32 mg/dL — ABNORMAL LOW (ref >39)
LDL Chol Calc (NIH): 88 mg/dL (ref 0–99)
Triglycerides: 122 mg/dL (ref 0–149)
VLDL Cholesterol Cal: 22 mg/dL (ref 5–40)

## 2023-02-27 ENCOUNTER — Ambulatory Visit (INDEPENDENT_AMBULATORY_CARE_PROVIDER_SITE_OTHER): Payer: Managed Care, Other (non HMO) | Admitting: Family Medicine

## 2023-02-27 ENCOUNTER — Encounter: Payer: Self-pay | Admitting: Family Medicine

## 2023-02-27 VITALS — BP 120/80 | HR 76 | Temp 97.9°F | Resp 16 | Wt 327.0 lb

## 2023-02-27 DIAGNOSIS — E1159 Type 2 diabetes mellitus with other circulatory complications: Secondary | ICD-10-CM

## 2023-02-27 DIAGNOSIS — E785 Hyperlipidemia, unspecified: Secondary | ICD-10-CM

## 2023-02-27 DIAGNOSIS — E1169 Type 2 diabetes mellitus with other specified complication: Secondary | ICD-10-CM

## 2023-02-27 DIAGNOSIS — E1122 Type 2 diabetes mellitus with diabetic chronic kidney disease: Secondary | ICD-10-CM

## 2023-02-27 DIAGNOSIS — E118 Type 2 diabetes mellitus with unspecified complications: Secondary | ICD-10-CM | POA: Diagnosis not present

## 2023-02-27 DIAGNOSIS — N183 Chronic kidney disease, stage 3 unspecified: Secondary | ICD-10-CM

## 2023-02-27 DIAGNOSIS — I152 Hypertension secondary to endocrine disorders: Secondary | ICD-10-CM

## 2023-02-27 LAB — POCT GLYCOSYLATED HEMOGLOBIN (HGB A1C): Hemoglobin A1C: 6.9 % — AB (ref 4.0–5.6)

## 2023-02-27 MED ORDER — TIRZEPATIDE 15 MG/0.5ML ~~LOC~~ SOAJ: 15.0000 mg | SUBCUTANEOUS | 1 refills | Status: DC

## 2023-02-27 NOTE — Progress Notes (Signed)
Subjective:    Patient ID: Vincent Cantu, male    DOB: 1971/08/07, 52 y.o.   MRN: [id number]  Vincent Cantu is a 52 y.o. male who presents for follow-up of Type 2 diabetes mellitus.  Home blood sugar records: fasting range: 115-120 Current symptoms/problems include none and have been stable. Daily foot checks: YES   Any foot concerns: NO How often blood sugars checked:daily Exercise: Home exercise routine includes walking. Diet: well balanced He does have the freestyle libre 2 not bring the device with him.  He states that the numbers all are usually below 180.  He is now taking 15 mg of Mounjaro, he continues on metformin.  He is also on amlodipine, Edarbyclor and metoprolol.  He is having no difficulty with any of these.  He does have intermittent difficulty with left arm numbness in his thumb but thinks that it is related to his shoulder.  He says the symptoms go away relatively quickly with minimal change in his body habitus.  The following portions of the patient's history were reviewed and updated as appropriate: allergies, current medications, past medical history, past social history and problem list.  ROS as in subjective above.     Objective:    Physical Exam Alert and in no distress otherwise not examined. Hemoglobin A1c is 6.9 Blood pressure 120/80, pulse 76, temperature 97.9 F (36.6 C), temperature source Oral, resp. rate 16, weight (!) 327 lb (148.3 kg), SpO2 97 %.  Lab Review    Latest Ref Rng & Units 02/27/2023    9:29 AM 02/20/2023    8:59 AM 12/31/2022    4:22 PM 10/22/2022    2:11 PM 09/22/2022    3:54 AM  Diabetic Labs  HbA1c 4.0 - 5.6 % 6.9    6.8    Chol 100 - 199 mg/dL  428      HDL >76 mg/dL  32      Calc LDL 0 - 99 mg/dL  88      Triglycerides 0 - 149 mg/dL  811      Creatinine 5.72 - 1.27 mg/dL  6.20  3.55   9.74       02/27/2023    9:17 AM 02/20/2023    8:22 AM 02/06/2023   10:43 AM 12/31/2022    7:15 PM 12/31/2022    4:15 PM  BP/Weight  Systolic  BP 120 163 128 137   Diastolic BP 80 82 76 96   Wt. (Lbs) 327 325 322  318  BMI 40.87 kg/m2 40.62 kg/m2 40.25 kg/m2  39.75 kg/m2      Latest Ref Rng & Units 01/24/2022   12:00 AM 06/19/2021    1:30 PM  Foot/eye exam completion dates  Eye Exam No Retinopathy No Retinopathy       Foot Form Completion   Done     This result is from an external source.    Vincent Cantu  reports that he has never smoked. He has never used smokeless tobacco. He reports that he does not drink alcohol and does not use drugs.     Assessment & Plan:    Type 2 diabetes with complication - Plan: POCT glycosylated hemoglobin (Hb A1C)  Hypertension associated with diabetes  Hyperlipidemia associated with type 2 diabetes mellitus  CKD stage 3 due to type 2 diabetes mellitus  Morbid obesity (HCC)  He is to bring his machine in with him the next time so we can hook it into our system.  Also  discussed cutting back on carbohydrates.  He will continue on his present medication regimen.  Briefly discussed the shoulder issue and since it goes away relatively quickly I would not pursue this.  He was comfortable with that.

## 2023-03-05 ENCOUNTER — Ambulatory Visit (HOSPITAL_COMMUNITY): Payer: Managed Care, Other (non HMO) | Attending: Cardiology

## 2023-03-05 DIAGNOSIS — I77810 Thoracic aortic ectasia: Secondary | ICD-10-CM

## 2023-03-05 DIAGNOSIS — I5030 Unspecified diastolic (congestive) heart failure: Secondary | ICD-10-CM | POA: Diagnosis not present

## 2023-03-05 DIAGNOSIS — R072 Precordial pain: Secondary | ICD-10-CM | POA: Diagnosis present

## 2023-03-05 DIAGNOSIS — I517 Cardiomegaly: Secondary | ICD-10-CM | POA: Diagnosis not present

## 2023-03-05 LAB — ECHOCARDIOGRAM COMPLETE
Area-P 1/2: 4.52 cm2
S' Lateral: 3.2 cm

## 2023-03-06 ENCOUNTER — Telehealth: Payer: Self-pay

## 2023-03-06 DIAGNOSIS — I712 Thoracic aortic aneurysm, without rupture, unspecified: Secondary | ICD-10-CM

## 2023-03-06 NOTE — Telephone Encounter (Signed)
-----   Message from Garwin Brothers, MD sent at 03/06/2023  9:51 AM EDT ----- Echo is fine but aortic root diameter is mildly dilated.  Needs CT scan.  Copy primary care Garwin Brothers, MD 03/06/2023 9:51 AM

## 2023-03-06 NOTE — Telephone Encounter (Signed)
Results reviewed with pt as per Dr. Revankar's note.  Pt verbalized understanding and had no additional questions. Routed to PCP.  

## 2023-03-12 ENCOUNTER — Ambulatory Visit (HOSPITAL_BASED_OUTPATIENT_CLINIC_OR_DEPARTMENT_OTHER)
Admission: RE | Admit: 2023-03-12 | Discharge: 2023-03-12 | Disposition: A | Discharge status: Home / Self Care | Payer: Managed Care, Other (non HMO) | Source: Ambulatory Visit | Attending: Cardiology | Admitting: Cardiology

## 2023-03-12 DIAGNOSIS — I712 Thoracic aortic aneurysm, without rupture, unspecified: Secondary | ICD-10-CM | POA: Diagnosis present

## 2023-04-11 ENCOUNTER — Other Ambulatory Visit: Payer: Self-pay | Admitting: Family Medicine

## 2023-04-11 DIAGNOSIS — I152 Hypertension secondary to endocrine disorders: Secondary | ICD-10-CM

## 2023-04-11 DIAGNOSIS — E118 Type 2 diabetes mellitus with unspecified complications: Secondary | ICD-10-CM

## 2023-06-17 ENCOUNTER — Other Ambulatory Visit: Payer: Self-pay | Admitting: Family Medicine

## 2023-06-17 DIAGNOSIS — E1159 Type 2 diabetes mellitus with other circulatory complications: Secondary | ICD-10-CM

## 2023-07-02 ENCOUNTER — Ambulatory Visit (INDEPENDENT_AMBULATORY_CARE_PROVIDER_SITE_OTHER): Payer: Managed Care, Other (non HMO) | Admitting: Family Medicine

## 2023-07-02 ENCOUNTER — Encounter: Payer: Self-pay | Admitting: Family Medicine

## 2023-07-02 VITALS — BP 110/86 | HR 92 | Temp 97.9°F | Resp 16 | Wt 322.0 lb

## 2023-07-02 DIAGNOSIS — I152 Hypertension secondary to endocrine disorders: Secondary | ICD-10-CM

## 2023-07-02 DIAGNOSIS — N183 Chronic kidney disease, stage 3 unspecified: Secondary | ICD-10-CM

## 2023-07-02 DIAGNOSIS — E1159 Type 2 diabetes mellitus with other circulatory complications: Secondary | ICD-10-CM | POA: Diagnosis not present

## 2023-07-02 DIAGNOSIS — E785 Hyperlipidemia, unspecified: Secondary | ICD-10-CM

## 2023-07-02 DIAGNOSIS — E118 Type 2 diabetes mellitus with unspecified complications: Secondary | ICD-10-CM

## 2023-07-02 DIAGNOSIS — E1122 Type 2 diabetes mellitus with diabetic chronic kidney disease: Secondary | ICD-10-CM | POA: Diagnosis not present

## 2023-07-02 DIAGNOSIS — Z23 Encounter for immunization: Secondary | ICD-10-CM | POA: Diagnosis not present

## 2023-07-02 DIAGNOSIS — E1169 Type 2 diabetes mellitus with other specified complication: Secondary | ICD-10-CM | POA: Diagnosis not present

## 2023-07-02 LAB — POCT GLYCOSYLATED HEMOGLOBIN (HGB A1C): Hemoglobin A1C: 7 % — AB (ref 4.0–5.6)

## 2023-07-02 MED ORDER — ROSUVASTATIN CALCIUM 40 MG PO TABS: 40.0000 mg | ORAL_TABLET | Freq: Every day | ORAL | 3 refills | Status: DC

## 2023-07-02 MED ORDER — TIRZEPATIDE 12.5 MG/0.5ML ~~LOC~~ SOAJ: 12.5000 mg | SUBCUTANEOUS | 1 refills | Status: DC

## 2023-07-02 NOTE — Progress Notes (Signed)
Subjective:    Patient ID: Vincent Cantu, male    DOB: October 19, 1971, 52 y.o.   MRN: [id number]  Vincent Cantu is a 52 y.o. male who presents for follow-up of Type 2 diabetes mellitus.  Home blood sugar records: fasting range: average 125 Current symptoms/problems include none and have been stable. Daily foot checks: yes   Any foot concerns: no How often blood sugars checked: daily Exercise: Home exercise routine includes walking 30 minutes daily. Diet: Regular He does have a freestyle libre and uses a sensor but did not bring it in.  He states that the numbers have not gone above roughly 160.  He is on [facility] and does note that it causes some upper abdominal discomfort and a feeling of fullness.  He has had recent cardiac evaluation which did show evidence of aortic atherosclerosis.  There was also a nodule in the lung but there was concerned about.  He has no risk factors for lung disease.  He would like a shingles shot.  The following portions of the patient's history were reviewed and updated as appropriate: allergies, current medications, past medical history, past social history and problem list.  ROS as in subjective above.     Objective:    Physical Exam Alert and in no distress otherwise not examined. Hemoglobin A1c is 7.0 Blood pressure 110/86, pulse 92, temperature 97.9 F (36.6 C), temperature source Oral, resp. rate 16, weight (!) 322 lb (146.1 kg), SpO2 97%.  Lab Review    Latest Ref Rng & Units 02/27/2023    9:29 AM 02/20/2023    8:59 AM 12/31/2022    4:22 PM 10/22/2022    2:11 PM 09/22/2022    3:54 AM  Diabetic Labs  HbA1c 4.0 - 5.6 % 6.9    6.8    Chol 100 - 199 mg/dL  469      HDL >62 mg/dL  32      Calc LDL 0 - 99 mg/dL  88      Triglycerides 0 - 149 mg/dL  952      Creatinine 8.41 - 1.27 mg/dL  3.24  4.01   0.27       07/02/2023    8:46 AM 02/27/2023    9:17 AM 02/20/2023    8:22 AM 02/06/2023   10:43 AM 12/31/2022    7:15 PM  BP/Weight  Systolic BP 110  253 130 128 137  Diastolic BP 86 80 82 76 96  Wt. (Lbs) 322 327 325 322   BMI 40.25 kg/m2 40.87 kg/m2 40.62 kg/m2 40.25 kg/m2       Latest Ref Rng & Units 01/24/2022   12:00 AM 06/19/2021    1:30 PM  Foot/eye exam completion dates  Eye Exam No Retinopathy No Retinopathy       Foot Form Completion   Done     This result is from an external source.    Vincent Cantu  reports that he has never smoked. He has never used smokeless tobacco. He reports that he does not drink alcohol and does not use drugs.     Assessment & Plan:    CKD stage 3 due to type 2 diabetes mellitus (HCC)  Hyperlipidemia associated with type 2 diabetes mellitus (HCC)  Hypertension associated with diabetes (HCC)  Type 2 diabetes with complication (HCC) - Plan: POCT glycosylated hemoglobin (Hb A1C)  He has lost roughly 5 pounds and I congratulated him with that.  I will decrease his Mounjaro to 12.5 mg  to see if that will help with his symptoms.  He will keep me informed.  Discussed the nodule with him and since he has no risk factors I do not think further evaluation is needed.  His freestyle Vincent Cantu is now being set up for use on his cell phone.

## 2023-07-04 ENCOUNTER — Other Ambulatory Visit: Payer: Self-pay | Admitting: Family Medicine

## 2023-07-04 DIAGNOSIS — E118 Type 2 diabetes mellitus with unspecified complications: Secondary | ICD-10-CM

## 2023-07-04 DIAGNOSIS — E1159 Type 2 diabetes mellitus with other circulatory complications: Secondary | ICD-10-CM

## 2023-07-10 ENCOUNTER — Telehealth: Payer: Self-pay | Admitting: Family Medicine

## 2023-07-10 NOTE — Telephone Encounter (Signed)
PA Case ID #: [id number] Rx #: [id number] Need Help? Call us at [phone] Outcome Approved today by [employer] 2017 CaseId:[id number];Status:Approved;Review Type:Prior Auth;Coverage Start Date:06/10/2023;Coverage End Date:07/09/2024; Authorization Expiration Date: 07/08/2024 Drug Mounjaro 12.5MG /0.5ML pen-injectors ePA cloud logo Form [employer] Electronic PA Form, sent [employer]

## 2023-08-15 ENCOUNTER — Ambulatory Visit: Payer: Managed Care, Other (non HMO) | Attending: Cardiology | Admitting: Cardiology

## 2023-08-15 ENCOUNTER — Encounter: Payer: Self-pay | Admitting: Cardiology

## 2023-08-15 VITALS — BP 104/64 | HR 92 | Ht 75.0 in | Wt 325.1 lb

## 2023-08-15 DIAGNOSIS — E1122 Type 2 diabetes mellitus with diabetic chronic kidney disease: Secondary | ICD-10-CM | POA: Diagnosis not present

## 2023-08-15 DIAGNOSIS — I700 Atherosclerosis of aorta: Secondary | ICD-10-CM | POA: Diagnosis not present

## 2023-08-15 DIAGNOSIS — N183 Chronic kidney disease, stage 3 unspecified: Secondary | ICD-10-CM

## 2023-08-15 DIAGNOSIS — E1159 Type 2 diabetes mellitus with other circulatory complications: Secondary | ICD-10-CM

## 2023-08-15 DIAGNOSIS — I152 Hypertension secondary to endocrine disorders: Secondary | ICD-10-CM

## 2023-08-15 NOTE — Patient Instructions (Signed)

## 2023-08-15 NOTE — Progress Notes (Signed)
Cardiology Office Note:    Date:  08/15/2023   ID:  Vincent Cantu, DOB 13-Aug-1971, MRN [id number]  PCP:  Vincent Nian, MD  Cardiologist:  Vincent Brothers, MD   Referring MD: Vincent Nian, MD    ASSESSMENT:    1. Aortic atherosclerosis (HCC)   2. Hypertension associated with diabetes (HCC)   3. Morbid obesity (HCC)   4. CKD stage 3 due to type 2 diabetes mellitus (HCC)    PLAN:    In order of problems listed above:  Primary prevention stressed with the patient.  Importance of compliance with diet medication stressed and patient verbalized standing. Hypertension: Blood pressure stable and diet was emphasized.  He denies any history of postural hypotension or any such issues. Mixed dyslipidemia: On lipid-lowering medications followed by primary care.  Lipids reviewed. Diabetes mellitus: Hemoglobin A1c is elevated.  He tells me that he is taking appropriate measures to do better. Morbid obesity: Weight reduction stressed diet emphasized and he promises to do better.  Risks of obesity explained. Renal insufficiency: I told him to get established at least be evaluated by a nephrologist in view of significant renal dysfunction and he will talk to his primary care about this. Patient will be seen in follow-up appointment in 9 months or earlier if the patient has any concerns.    Medication Adjustments/Labs and Tests Ordered: Current medicines are reviewed at length with the patient today.  Concerns regarding medicines are outlined above.  No orders of the defined types were placed in this encounter.  No orders of the defined types were placed in this encounter.    No chief complaint on file.    History of Present Illness:    Vincent Cantu is a 52 y.o. male.  Patient has past medical history of aortic atherosclerosis essential hypertension, mixed dyslipidemia, diabetes mellitus and morbid obesity.  He denies any problems at this time and takes care of activities of  daily living.  No chest pain orthopnea or PND.  He has renal insufficiency.  At the time of my evaluation, the patient is alert awake oriented and in no distress.  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   Hematuria 02/06/2023   History of COVID-19 06/19/2021   June 2022   Hyperlipidemia associated with type 2 diabetes mellitus (HCC) 06/11/2007   Qualifier: Diagnosis of   By: Mayford Knife, LPN, Bonnye M      Hypertension associated with diabetes (HCC) 06/11/2007   Qualifier: Diagnosis of   By: Mayford Knife, LPN, Domenic Polite      Morbid obesity (HCC) 06/22/2013   Personal history of colonic polyps 02/06/2023   Type 2 diabetes with complication (HCC) 03/23/2015    Past Surgical History:  Procedure Laterality Date   arthroscopic to knee for meniscus tear     INCISIONAL HERNIA REPAIR N/A 09/26/2021   Procedure: LAPAROSCOPIC  INCISIONAL HERNIA REPAIR;  Surgeon: Vincent Miyamoto, MD;  Location: [facility] OR;  Service: General;  Laterality: N/A;   INSERTION OF MESH N/A 09/26/2021   Procedure: INSERTION OF MESH;  Surgeon: Vincent Miyamoto, MD;  Location: [facility] OR;  Service: General;  Laterality: N/A;   LUMBAR FUSION  LUMBAR LAMINECTOMY     umbilical hernia repair with mesh x 2     VENTRAL HERNIA REPAIR N/A 08/28/2014   Procedure: HERNIA REPAIR VENTRAL ADULT;  Surgeon: Vincent Schmidt, MD;  Location: [facility] OR;  Service: General;  Laterality: N/A;    Current Medications: Current Meds  Medication Sig   amLODipine (NORVASC) 10 MG tablet TAKE 1 TABLET BY MOUTH EVERY DAY   aspirin EC 81 MG tablet Take 1 tablet (81 mg total) by mouth daily. Swallow whole.   Blood Glucose Monitoring Suppl DEVI Pt needs a one touch ultra meter   cetirizine (ZYRTEC)  10 MG tablet Take 10 mg by mouth at bedtime.   Cholecalciferol (VITAMIN D3) 125 MCG (5000 UT) CAPS Take 5,000 Units by mouth daily.   Continuous Blood Gluc Receiver (FREESTYLE LIBRE 3 READER) DEVI 1 each by Does not apply route daily.   Continuous Glucose Sensor (FREESTYLE LIBRE 3 SENSOR) MISC PLACE 1 SENSOR ON THE SKIN EVERY 14 DAYS. USE TO CHECK GLUCOSE CONTINUOUSLY   diclofenac (VOLTAREN) 75 MG EC tablet Take 1 tablet (75 mg total) by mouth 2 (two) times daily.   EDARBYCLOR 40-25 MG TABS TAKE 1 TABLET BY MOUTH ONCE DAILY   GARLIC PO Take 1 capsule by mouth daily.   glucose blood test strip Test once a day. Uses one touch ultra meter   Melatonin 10 MG TABS Take 10 mg by mouth at bedtime.   meloxicam (MOBIC) 15 MG tablet Take 1 tablet (15 mg total) by mouth daily.   metFORMIN (GLUCOPHAGE) 1000 MG tablet Take 1 tablet (1,000 mg total) by mouth 2 (two) times daily.   metoprolol (TOPROL-XL) 200 MG 24 hr tablet TAKE 1 TABLET BY MOUTH EVERY DAY   nitroGLYCERIN (NITROSTAT) 0.4 MG SL tablet Place 0.4 mg under the tongue every 5 (five) minutes as needed for chest pain.   ondansetron (ZOFRAN) 4 MG tablet Take 1 tablet (4 mg total) by mouth every 6 (six) hours.   ONETOUCH DELICA LANCETS FINE MISC Test once a day   OVER THE COUNTER MEDICATION Take 1 capsule by mouth daily. Sea Moss with Beet Root   pantoprazole (PROTONIX) 40 MG tablet Take 1 tablet (40 mg total) by mouth daily.   rosuvastatin (CRESTOR) 40 MG tablet Take 1 tablet (40 mg total) by mouth daily.   tadalafil (CIALIS) 20 MG tablet Take 1 tablet (20 mg total) by mouth daily as needed for erectile dysfunction.   terazosin (HYTRIN) 2 MG capsule TAKE 1 CAPSULE BY MOUTH EVERYDAY AT BEDTIME   tirzepatide (MOUNJARO) 12.5 MG/0.5ML Pen Inject 12.5 mg into the skin once a week.   tirzepatide (MOUNJARO) 7.5 MG/0.5ML Pen INJECT 7.5 MG SUBCUTANEOUSLY WEEKLY   traMADol (ULTRAM) 50 MG tablet Take 1-2 tablets (50-100 mg total) by mouth every 6 (six) hours as  needed.     Allergies:   Patient has no known allergies.   Social History   Socioeconomic History   Marital status: Married    Spouse name: Vincent Cantu   Number of children: 5   Years of education: Not on file   Highest education level: Not on file  Occupational History   Not on file  Tobacco Use   Smoking status: Never   Smokeless tobacco: Never  Vaping Use   Vaping status: Never Used  Substance and Sexual Activity   Alcohol use: No    Alcohol/week: 0.0 standard drinks of alcohol   Drug use: No   Sexual activity: Yes  Other Topics Concern  Not on file  Social History Narrative      Social Determinants of Health   Financial Resource Strain: Not on file  Food Insecurity: Not on file  Transportation Needs: Not on file  Physical Activity: Not on file  Stress: Not on file  Social Connections: Not on file     Family History: The patient's family history includes Alcohol abuse in his father; Diabetes in his maternal grandmother, mother, and another family member; Hypertension in his father, mother, and another family member; Seizures in an other family member.  ROS:   Please see the history of present illness.    All other systems reviewed and are negative.  EKGs/Labs/Other Studies Reviewed:    The following studies were reviewed today: I discussed my findings with the patient at length   Recent Labs: 12/31/2022: Hemoglobin 13.1; Platelets 258 02/20/2023: ALT 21; BUN 16; Creatinine, Ser 1.62; Potassium 3.9; Sodium 140  Recent Lipid Panel    Component Value Date/Time   CHOL 142 02/20/2023 0859   TRIG 122 02/20/2023 0859   HDL 32 (L) 02/20/2023 0859   CHOLHDL 4.4 02/20/2023 0859   CHOLHDL 4.9 07/30/2017 1046   VLDL 31 (H) 07/10/2016 0001   LDLCALC 88 02/20/2023 0859   LDLCALC 91 07/30/2017 1046   LDLDIRECT 103.5 01/20/2012 0843    Physical Exam:    VS:  BP 104/64   Pulse 92   Ht 6\' 3"  (1.905 m)   Wt (!) 325 lb 1.3 oz (147.5 kg)   SpO2 95%   BMI 40.63  kg/m     Wt Readings from Last 3 Encounters:  08/15/23 (!) 325 lb 1.3 oz (147.5 kg)  07/02/23 (!) 322 lb (146.1 kg)  02/27/23 (!) 327 lb (148.3 kg)     GEN: Patient is in no acute distress HEENT: Normal NECK: No JVD; No carotid bruits LYMPHATICS: No lymphadenopathy CARDIAC: Hear sounds regular, 2/6 systolic murmur at the apex. RESPIRATORY:  Clear to auscultation without rales, wheezing or rhonchi  ABDOMEN: Soft, non-tender, non-distended MUSCULOSKELETAL:  No edema; No deformity  SKIN: Warm and dry NEUROLOGIC:  Alert and oriented x 3 PSYCHIATRIC:  Normal affect   Signed, Vincent Brothers, MD  08/15/2023 9:38 AM    [facility]

## 2023-09-19 ENCOUNTER — Other Ambulatory Visit: Payer: Self-pay | Admitting: Podiatry

## 2023-09-23 ENCOUNTER — Ambulatory Visit (INDEPENDENT_AMBULATORY_CARE_PROVIDER_SITE_OTHER): Payer: Managed Care, Other (non HMO) | Admitting: Medical

## 2023-09-23 VITALS — BP 120/80 | HR 84 | Wt 320.0 lb

## 2023-09-23 DIAGNOSIS — E162 Hypoglycemia, unspecified: Secondary | ICD-10-CM | POA: Diagnosis not present

## 2023-09-23 DIAGNOSIS — E118 Type 2 diabetes mellitus with unspecified complications: Secondary | ICD-10-CM

## 2023-09-23 MED ORDER — TIRZEPATIDE 5 MG/0.5ML ~~LOC~~ SOAJ: 5.0000 mg | SUBCUTANEOUS | 1 refills | Status: DC

## 2023-09-23 MED ORDER — GVOKE HYPOPEN 1-PACK 1 MG/0.2ML ~~LOC~~ SOAJ: 1.0000 mg | Freq: Two times a day (BID) | SUBCUTANEOUS | 1 refills | Status: DC | PRN

## 2023-09-23 NOTE — Progress Notes (Signed)
Subjective:  Vincent Cantu is a 52 y.o. male who presents for Chief Complaint  Patient presents with   low blood sugars    Low sugar readings into the 70s for the 2-3 days Just ate salad before visit and now is 104     Here for concerns about blood sugars being low.  Normally sees Dr. Susann Givens here.  Uses freestyle libre.  Metformin was listed but he has not been taking this.  He has been doing Mounjaro 7.5 mg since August.  He was on a higher dose but he was having GI upset with that.  He is using the freestyle libre and he has his numbers with him on his phone.  In the last 2 weeks he has had 3 episodes of readings around 70 at night or in the early hours of the morning.  No readings in the 60s or 50s or lower.  His 90-day average is 127 and his 30 and 60-day average are also in the same range at goal.  He is eating 3 times a day, not skipping meals.  He works 2 jobs.  He tries to eat pretty healthy including salads at lunch.  His biggest meal is nighttime.  Compliant with his other medicines as usual  No other aggravating or relieving factors.    No other c/o.  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   Hematuria 02/06/2023   History of COVID-19 06/19/2021   June 2022   Hyperlipidemia associated with type 2 diabetes mellitus (HCC) 06/11/2007   Qualifier: Diagnosis of   By: Mayford Knife, LPN, Bonnye M      Hypertension associated with diabetes (HCC) 06/11/2007   Qualifier: Diagnosis of   By: Mayford Knife, LPN, Domenic Polite      Morbid obesity (HCC) 06/22/2013   Personal history of colonic polyps 02/06/2023   Type 2 diabetes with complication (HCC) 03/23/2015   Current Outpatient Medications on File Prior to Visit   Medication Sig Dispense Refill   amLODipine (NORVASC) 10 MG tablet TAKE 1 TABLET BY MOUTH EVERY DAY 90 tablet 1   aspirin EC 81 MG tablet Take 1 tablet (81 mg total) by mouth daily. Swallow whole. 90 tablet 3   cetirizine (ZYRTEC) 10 MG tablet Take 10 mg by mouth at bedtime.     Cholecalciferol (VITAMIN D3) 125 MCG (5000 UT) CAPS Take 5,000 Units by mouth daily.     Continuous Blood Gluc Receiver (FREESTYLE LIBRE 3 READER) DEVI 1 each by Does not apply route daily. 1 each 1   EDARBYCLOR 40-25 MG TABS TAKE 1 TABLET BY MOUTH ONCE DAILY 90 tablet 0   GARLIC PO Take 1 capsule by mouth daily.     Melatonin 10 MG TABS Take 10 mg by mouth at bedtime.     meloxicam (MOBIC) 15 MG tablet Take 1 tablet (15 mg total) by mouth daily. 30 tablet 3   metoprolol (TOPROL-XL) 200 MG 24 hr tablet TAKE 1 TABLET BY MOUTH EVERY DAY 90 tablet 1   ondansetron (ZOFRAN) 4 MG tablet Take 1 tablet (4 mg total) by mouth every 6 (six) hours. 12 tablet 0   pantoprazole (PROTONIX) 40 MG tablet Take 1 tablet (40  mg total) by mouth daily. 21 tablet 0   rosuvastatin (CRESTOR) 40 MG tablet Take 1 tablet (40 mg total) by mouth daily. 90 tablet 3   tadalafil (CIALIS) 20 MG tablet Take 1 tablet (20 mg total) by mouth daily as needed for erectile dysfunction. 10 tablet 5   terazosin (HYTRIN) 2 MG capsule TAKE 1 CAPSULE BY MOUTH EVERYDAY AT BEDTIME 90 capsule 1   traMADol (ULTRAM) 50 MG tablet Take 1-2 tablets (50-100 mg total) by mouth every 6 (six) hours as needed. 25 tablet 0   Blood Glucose Monitoring Suppl DEVI Pt needs a one touch ultra meter 1 each 0   Continuous Glucose Sensor (FREESTYLE LIBRE 3 SENSOR) MISC PLACE 1 SENSOR ON THE SKIN EVERY 14 DAYS. USE TO CHECK GLUCOSE CONTINUOUSLY 2 each 2   glucose blood test strip Test once a day. Uses one touch ultra meter 100 each 5   metFORMIN (GLUCOPHAGE) 1000 MG tablet Take 1 tablet (1,000 mg total) by mouth 2 (two) times daily. (Patient not taking: Reported on 09/23/2023) 180 tablet 1    nitroGLYCERIN (NITROSTAT) 0.4 MG SL tablet Place 0.4 mg under the tongue every 5 (five) minutes as needed for chest pain.     ONETOUCH DELICA LANCETS FINE MISC Test once a day 100 each 5   OVER THE COUNTER MEDICATION Take 1 capsule by mouth daily. Sea Moss with Beet Root     No current facility-administered medications on file prior to visit.     The following portions of the patient's history were reviewed and updated as appropriate: allergies, current medications, past family history, past medical history, past social history, past surgical history and problem list.  ROS Otherwise as in subjective above  Objective: BP 120/80   Pulse 84   Wt (!) 320 lb (145.2 kg)   BMI 40.00 kg/m   Wt Readings from Last 3 Encounters:  09/23/23 (!) 320 lb (145.2 kg)  08/15/23 (!) 325 lb 1.3 oz (147.5 kg)  07/02/23 (!) 322 lb (146.1 kg)   BP Readings from Last 3 Encounters:  09/23/23 120/80  08/15/23 104/64  07/02/23 110/86   General appearance: alert, no distress, well developed, well nourished    Assessment: Encounter Diagnoses  Name Primary?   Type 2 diabetes with complication (HCC) Yes   Low blood glucose measurement      Plan: I reviewed his freestyle libre numbers on his device.  90-day glucose average 127.  In the last 2 weeks he has had 3 readings in the 70s in the early hours in the morning that trigger the low glucose alarm.  Nothing lower than that though.  He is eating pretty healthy.  He has not been taking metformin.  Since he has been on 7.5 mg Mounjaro I will drop him down to the 5 mg Mounjaro weekly.  Counseled on diet and exercise.  We discussed steps to take if he gets hypoglycemic readings under 70.  We also discussed the Gvoke pen if he gets readings as low as 50 or less  Continue rest of medicines as usual  Eliazar was seen today for low blood sugars.  Diagnoses and all orders for this visit:  Type 2 diabetes with complication (HCC)  Low blood glucose  measurement  Other orders -     tirzepatide [facility]) 5 MG/0.5ML Pen; Inject 5 mg into the skin once a week. -     Glucagon (GVOKE HYPOPEN 1-PACK) 1 MG/0.2ML SOAJ; Inject 1 mg into the skin 3 times/day  as needed-between meals & bedtime.   Follow up: in December with PCP here as planned

## 2023-09-30 ENCOUNTER — Other Ambulatory Visit: Payer: Self-pay | Admitting: Medical

## 2023-10-01 ENCOUNTER — Telehealth: Payer: Self-pay | Admitting: Family Medicine

## 2023-10-01 NOTE — Telephone Encounter (Signed)
Pt requesting a refill on metoprolol to  [id number] - [place], [place] - [address]  He is currently out.

## 2023-10-07 ENCOUNTER — Other Ambulatory Visit: Payer: Self-pay | Admitting: Family Medicine

## 2023-10-07 DIAGNOSIS — I152 Hypertension secondary to endocrine disorders: Secondary | ICD-10-CM

## 2023-11-04 ENCOUNTER — Ambulatory Visit
Admission: RE | Admit: 2023-11-04 | Discharge: 2023-11-04 | Disposition: A | Discharge status: Home / Self Care | Payer: Managed Care, Other (non HMO) | Source: Ambulatory Visit | Attending: Family Medicine | Admitting: Family Medicine

## 2023-11-04 ENCOUNTER — Ambulatory Visit (INDEPENDENT_AMBULATORY_CARE_PROVIDER_SITE_OTHER): Payer: Managed Care, Other (non HMO) | Admitting: Family Medicine

## 2023-11-04 VITALS — BP 138/84 | HR 76 | Ht 75.0 in | Wt 321.8 lb

## 2023-11-04 DIAGNOSIS — E1159 Type 2 diabetes mellitus with other circulatory complications: Secondary | ICD-10-CM | POA: Diagnosis not present

## 2023-11-04 DIAGNOSIS — Z23 Encounter for immunization: Secondary | ICD-10-CM | POA: Diagnosis not present

## 2023-11-04 DIAGNOSIS — E118 Type 2 diabetes mellitus with unspecified complications: Secondary | ICD-10-CM | POA: Diagnosis not present

## 2023-11-04 DIAGNOSIS — E1169 Type 2 diabetes mellitus with other specified complication: Secondary | ICD-10-CM

## 2023-11-04 DIAGNOSIS — I152 Hypertension secondary to endocrine disorders: Secondary | ICD-10-CM

## 2023-11-04 DIAGNOSIS — E785 Hyperlipidemia, unspecified: Secondary | ICD-10-CM

## 2023-11-04 DIAGNOSIS — M542 Cervicalgia: Secondary | ICD-10-CM

## 2023-11-04 LAB — POCT GLYCOSYLATED HEMOGLOBIN (HGB A1C): Hemoglobin A1C: 6.5 % — AB (ref 4.0–5.6)

## 2023-11-04 LAB — POCT UA - MICROALBUMIN
Albumin/Creatinine Ratio, Urine, POC: 2.2
Creatinine, POC: 339.2 mg/dL
Microalbumin Ur, POC: 7.4 mg/L

## 2023-11-04 MED ORDER — TIRZEPATIDE 10 MG/0.5ML ~~LOC~~ SOAJ: 10.0000 mg | SUBCUTANEOUS | 1 refills | Status: DC

## 2023-11-04 NOTE — Progress Notes (Signed)
   Subjective:    Patient ID: Vincent Cantu, male    DOB: June 06, 1971, 52 y.o.   MRN: [id number]  HPI He is here for follow-up on his diabetes.  He has made some dietary changes and has seen a nutritionist in the past.  He also has been exercising regularly.  Taking Edarbychlor without difficulty.  He is also he has had difficulty with Mounjaro causing unacceptable side effects.  Apparently 15 mg was too strong.  He has been taking the combination equivalent to about 12.5 mg.  He continues on Hytrin, metoprolol and Edarbychlor.  Also taking Hytrin.  He now also complains of continued difficulty with neck pain but does now note over the last month or 2 radiation from his neck down to his hands into his fingers.  He notes this when he rotates his head.  The symptoms are intermittent in nature.   Review of Systems     Objective:    Physical Exam Alert and in no distress.  Negative Spurling test.  Sensation is normal.  DTRs are difficult to obtain in the arms.  Does show motor weakness of the left deltoid.       Assessment & Plan:  Type 2 diabetes with complication (HCC) - Plan: POCT UA - Microalbumin, HgB A1c, tirzepatide (MOUNJARO) 10 MG/0.5ML Pen  Morbid obesity (HCC)  Hyperlipidemia associated with type 2 diabetes mellitus (HCC)  Hypertension associated with diabetes (HCC)  Need for influenza vaccination - Plan: Flu vaccine trivalent PF, 6mos and older(Flulaval,Afluria,Fluarix,Fluzone)  Need for shingles vaccine - Plan: Zoster Recombinant (Shingrix )  Neck pain - Plan: DG Cervical Spine Complete He will continue on his present medication regimen.  Immunizations given.  I will also get a C-spine x-ray with anticipation of doing an MRI since he is now showing some motor weakness of probable C3-4 or possibly 4- 5

## 2023-11-07 ENCOUNTER — Other Ambulatory Visit: Payer: Self-pay | Admitting: Family Medicine

## 2023-11-07 DIAGNOSIS — E118 Type 2 diabetes mellitus with unspecified complications: Secondary | ICD-10-CM

## 2023-11-14 ENCOUNTER — Encounter: Payer: Self-pay | Admitting: Family Medicine

## 2023-11-14 NOTE — Addendum Note (Signed)
Addended by: Ronnald Nian on: 11/14/2023 02:36 PM   Modules accepted: Orders

## 2023-11-16 ENCOUNTER — Other Ambulatory Visit: Payer: Managed Care, Other (non HMO)

## 2023-11-17 ENCOUNTER — Other Ambulatory Visit: Payer: Self-pay | Admitting: Family Medicine

## 2023-11-17 ENCOUNTER — Other Ambulatory Visit: Payer: Self-pay | Admitting: Podiatry

## 2023-11-17 DIAGNOSIS — I152 Hypertension secondary to endocrine disorders: Secondary | ICD-10-CM

## 2023-12-06 ENCOUNTER — Other Ambulatory Visit: Payer: Self-pay | Admitting: Family Medicine

## 2023-12-06 ENCOUNTER — Other Ambulatory Visit: Payer: Self-pay | Admitting: Medical

## 2023-12-06 DIAGNOSIS — I152 Hypertension secondary to endocrine disorders: Secondary | ICD-10-CM

## 2023-12-12 ENCOUNTER — Telehealth: Payer: Self-pay

## 2023-12-12 NOTE — Telephone Encounter (Signed)
Pt called stating MRI was cancelled by insurance. Pt spoke with his insurance because the PA was denied. Insurance states he needs to go to hysical therapy for 6 weeks first. If he has 3/5 weekness or less after then he can get the MRI.   Ok to send in referral to PT for neck pain?

## 2023-12-15 ENCOUNTER — Other Ambulatory Visit: Payer: Self-pay

## 2023-12-15 DIAGNOSIS — M542 Cervicalgia: Secondary | ICD-10-CM

## 2023-12-15 NOTE — Telephone Encounter (Signed)
Referral ordered and pt sent MyChart message.

## 2023-12-23 ENCOUNTER — Other Ambulatory Visit: Payer: Self-pay | Admitting: Family Medicine

## 2023-12-23 ENCOUNTER — Other Ambulatory Visit: Payer: Self-pay | Admitting: Medical

## 2023-12-23 DIAGNOSIS — E1159 Type 2 diabetes mellitus with other circulatory complications: Secondary | ICD-10-CM

## 2024-01-06 ENCOUNTER — Other Ambulatory Visit: Payer: Self-pay | Admitting: Family Medicine

## 2024-01-06 DIAGNOSIS — E1159 Type 2 diabetes mellitus with other circulatory complications: Secondary | ICD-10-CM

## 2024-01-13 ENCOUNTER — Encounter: Payer: Self-pay | Admitting: Internal Medicine

## 2024-01-19 ENCOUNTER — Other Ambulatory Visit: Payer: Self-pay | Admitting: Family Medicine

## 2024-01-19 DIAGNOSIS — E118 Type 2 diabetes mellitus with unspecified complications: Secondary | ICD-10-CM

## 2024-02-01 ENCOUNTER — Other Ambulatory Visit: Payer: Self-pay | Admitting: Family Medicine

## 2024-02-01 DIAGNOSIS — I152 Hypertension secondary to endocrine disorders: Secondary | ICD-10-CM

## 2024-02-18 ENCOUNTER — Telehealth: Payer: Self-pay | Admitting: Family Medicine

## 2024-02-18 DIAGNOSIS — I152 Hypertension secondary to endocrine disorders: Secondary | ICD-10-CM

## 2024-02-18 DIAGNOSIS — E1169 Type 2 diabetes mellitus with other specified complication: Secondary | ICD-10-CM

## 2024-02-18 MED ORDER — METOPROLOL SUCCINATE ER 200 MG PO TB24: 200.0000 mg | ORAL_TABLET | Freq: Every day | ORAL | 1 refills | Status: DC

## 2024-02-18 MED ORDER — ROSUVASTATIN CALCIUM 40 MG PO TABS: 40.0000 mg | ORAL_TABLET | Freq: Every day | ORAL | 1 refills | Status: DC

## 2024-02-18 MED ORDER — TERAZOSIN HCL 2 MG PO CAPS: ORAL_CAPSULE | ORAL | 1 refills | Status: DC

## 2024-02-18 MED ORDER — AMLODIPINE BESYLATE 10 MG PO TABS: 10.0000 mg | ORAL_TABLET | Freq: Every day | ORAL | 1 refills | Status: DC

## 2024-02-18 NOTE — Telephone Encounter (Signed)
 [facility]  Atorvastatin 20 mg 90 day supply

## 2024-02-18 NOTE — Telephone Encounter (Signed)
 Also requesting refills on  Terazosin 2 mg Amlodipine Besylate 10 mg Rosuvastatin 40 mg Metroprolol succinate  200 mg

## 2024-02-18 NOTE — Telephone Encounter (Signed)
 refilled

## 2024-03-24 ENCOUNTER — Other Ambulatory Visit: Payer: Self-pay | Admitting: Family Medicine

## 2024-03-24 ENCOUNTER — Other Ambulatory Visit: Payer: Self-pay | Admitting: Medical

## 2024-03-24 DIAGNOSIS — E118 Type 2 diabetes mellitus with unspecified complications: Secondary | ICD-10-CM

## 2024-03-24 MED ORDER — TIRZEPATIDE 10 MG/0.5ML ~~LOC~~ SOAJ: 10.0000 mg | SUBCUTANEOUS | 1 refills | Status: AC

## 2024-03-24 NOTE — Telephone Encounter (Signed)
 Copied from CRM [id number]. Topic: Clinical - Medication Refill >> Mar 24, 2024  4:46 PM Vincent Cantu wrote: Medication: tirzepatide  (MOUNJARO ) 10 MG/0.5ML Pen [[id number]]  Has the patient contacted their pharmacy? Yes (Agent: If no, request that the patient contact the pharmacy for the refill. If patient does not wish to contact the pharmacy document the reason why and proceed with request.) (Agent: If yes, when and what did the pharmacy advise?)  This is the patient's preferred pharmacy:  [id number] - [place], [place] - [address] AT [facility] [address] [place] [address] Phone: [phone] Fax: [phone]   Is this the correct pharmacy for this prescription? Yes If no, delete pharmacy and type the correct one.   Has the prescription been filled recently? No  Is the patient out of the medication? Yes  Has the patient been seen for an appointment in the last year OR does the patient have an upcoming appointment? Yes  Can we respond through MyChart? No  Agent: Please be advised that Rx refills may take up to 3 business days. We ask that you follow-up with your pharmacy.

## 2024-03-24 NOTE — Telephone Encounter (Signed)
 Looks like pt is on 10mg  per med list

## 2024-03-24 NOTE — Telephone Encounter (Signed)
 Last Fill: 11/04/23  Last OV: 11/04/23 Next OV: None Scheduled  Routing to provider for review/authorization.

## 2024-04-11 ENCOUNTER — Other Ambulatory Visit: Payer: Self-pay | Admitting: Family Medicine

## 2024-04-11 DIAGNOSIS — E118 Type 2 diabetes mellitus with unspecified complications: Secondary | ICD-10-CM

## 2024-04-27 ENCOUNTER — Other Ambulatory Visit: Payer: Self-pay | Admitting: Family Medicine

## 2024-04-27 DIAGNOSIS — E1169 Type 2 diabetes mellitus with other specified complication: Secondary | ICD-10-CM

## 2024-05-01 ENCOUNTER — Other Ambulatory Visit: Payer: Self-pay

## 2024-05-01 ENCOUNTER — Emergency Department (HOSPITAL_BASED_OUTPATIENT_CLINIC_OR_DEPARTMENT_OTHER)
Admission: EM | Admit: 2024-05-01 | Discharge: 2024-05-01 | Disposition: A | Discharge status: Home / Self Care | Attending: Emergency Medicine | Admitting: Emergency Medicine

## 2024-05-01 ENCOUNTER — Encounter (HOSPITAL_BASED_OUTPATIENT_CLINIC_OR_DEPARTMENT_OTHER): Payer: Self-pay

## 2024-05-01 DIAGNOSIS — I129 Hypertensive chronic kidney disease with stage 1 through stage 4 chronic kidney disease, or unspecified chronic kidney disease: Secondary | ICD-10-CM | POA: Diagnosis not present

## 2024-05-01 DIAGNOSIS — R300 Dysuria: Secondary | ICD-10-CM | POA: Diagnosis present

## 2024-05-01 DIAGNOSIS — Z79899 Other long term (current) drug therapy: Secondary | ICD-10-CM | POA: Insufficient documentation

## 2024-05-01 DIAGNOSIS — N390 Urinary tract infection, site not specified: Secondary | ICD-10-CM | POA: Insufficient documentation

## 2024-05-01 DIAGNOSIS — E1122 Type 2 diabetes mellitus with diabetic chronic kidney disease: Secondary | ICD-10-CM | POA: Diagnosis not present

## 2024-05-01 DIAGNOSIS — Z7982 Long term (current) use of aspirin: Secondary | ICD-10-CM | POA: Insufficient documentation

## 2024-05-01 DIAGNOSIS — N189 Chronic kidney disease, unspecified: Secondary | ICD-10-CM | POA: Diagnosis not present

## 2024-05-01 LAB — COMPREHENSIVE METABOLIC PANEL WITH GFR
ALT: 20 U/L (ref 0–44)
AST: 25 U/L (ref 15–41)
Albumin: 4.1 g/dL (ref 3.5–5.0)
Alkaline Phosphatase: 65 U/L (ref 38–126)
Anion gap: 17 — ABNORMAL HIGH (ref 5–15)
BUN: 22 mg/dL — ABNORMAL HIGH (ref 6–20)
CO2: 21 mmol/L — ABNORMAL LOW (ref 22–32)
Calcium: 9.5 mg/dL (ref 8.9–10.3)
Chloride: 101 mmol/L (ref 98–111)
Creatinine, Ser: 1.93 mg/dL — ABNORMAL HIGH (ref 0.61–1.24)
GFR, Estimated: 40 mL/min — ABNORMAL LOW (ref >60)
Glucose, Bld: 133 mg/dL — ABNORMAL HIGH (ref 70–99)
Potassium: 3.4 mmol/L — ABNORMAL LOW (ref 3.5–5.1)
Sodium: 139 mmol/L (ref 135–145)
Total Bilirubin: 1.6 mg/dL — ABNORMAL HIGH (ref 0.0–1.2)
Total Protein: 7.8 g/dL (ref 6.5–8.1)

## 2024-05-01 LAB — URINALYSIS, ROUTINE W REFLEX MICROSCOPIC
Bilirubin Urine: NEGATIVE
Glucose, UA: NEGATIVE mg/dL
Ketones, ur: NEGATIVE mg/dL
Nitrite: NEGATIVE
Specific Gravity, Urine: 1.026 (ref 1.005–1.030)
WBC, UA: >50 WBC/hpf (ref 0–5)
pH: 5.5 (ref 5.0–8.0)

## 2024-05-01 LAB — CBC WITH DIFFERENTIAL/PLATELET
Abs Immature Granulocytes: 0.17 10*3/uL — ABNORMAL HIGH (ref 0.00–0.07)
Basophils Absolute: 0 10*3/uL (ref 0.0–0.1)
Basophils Relative: 0 %
Eosinophils Absolute: 0 10*3/uL (ref 0.0–0.5)
Eosinophils Relative: 0 %
HCT: 40.3 % (ref 39.0–52.0)
Hemoglobin: 13 g/dL (ref 13.0–17.0)
Immature Granulocytes: 1 %
Lymphocytes Relative: 12 %
Lymphs Abs: 1.8 10*3/uL (ref 0.7–4.0)
MCH: 26.1 pg (ref 26.0–34.0)
MCHC: 32.3 g/dL (ref 30.0–36.0)
MCV: 80.9 fL (ref 80.0–100.0)
Monocytes Absolute: 1 10*3/uL (ref 0.1–1.0)
Monocytes Relative: 7 %
Neutro Abs: 11.9 10*3/uL — ABNORMAL HIGH (ref 1.7–7.7)
Neutrophils Relative %: 80 %
Platelets: 161 10*3/uL (ref 150–400)
RBC: 4.98 MIL/uL (ref 4.22–5.81)
RDW: 14.9 % (ref 11.5–15.5)
WBC: 15 10*3/uL — ABNORMAL HIGH (ref 4.0–10.5)
nRBC: 0 % (ref 0.0–0.2)

## 2024-05-01 LAB — LACTIC ACID, PLASMA: Lactic Acid, Venous: 1.7 mmol/L (ref 0.5–1.9)

## 2024-05-01 MED ORDER — ONDANSETRON 4 MG PO TBDP: 4.0000 mg | ORAL_TABLET | Freq: Three times a day (TID) | ORAL | 0 refills | Status: AC | PRN

## 2024-05-01 MED ORDER — SODIUM CHLORIDE 0.9 % IV BOLUS
1000.0000 mL | Freq: Once | INTRAVENOUS | Status: AC
  Administered 2024-05-01: 1000 mL via INTRAVENOUS

## 2024-05-01 MED ORDER — CIPROFLOXACIN HCL 500 MG PO TABS: 500.0000 mg | ORAL_TABLET | Freq: Two times a day (BID) | ORAL | 0 refills | Status: AC

## 2024-05-01 MED ORDER — CEFTRIAXONE SODIUM 2 G IJ SOLR
2.0000 g | Freq: Once | INTRAMUSCULAR | Status: AC
  Filled 2024-05-01: qty 20

## 2024-05-01 NOTE — ED Triage Notes (Signed)
 Pt c/o painful stinging urination, malodorous urine, fever yesterday (102Tmax). States he called PCP & was advised to come for eval.

## 2024-05-01 NOTE — Discharge Instructions (Signed)
 Please read and follow all provided instructions.  Your diagnoses today include:  1. Complicated UTI (urinary tract infection)     Tests performed today include: Complete blood cell count: Your white blood cell count was high in response to infection Comprehensive metabolic panel: Blood sugar was 133, creatinine was slightly higher than your baseline at 1.93, no sign of DKA or other diabetic complication Lactic acid: Test for severe sepsis was normal Urinalysis (urine test): Urine test is suggestive of infection Vital signs. See below for your results today.   Medications prescribed:  Ciprofloxacin - antibiotic  You have been prescribed an antibiotic medicine: take the entire course of medicine even if you are feeling better. Stopping early can cause the antibiotic not to work.  Zofran  (ondansetron ) - for nausea and vomiting  Take any prescribed medications only as directed.  Home care instructions:  Follow any educational materials contained in this packet.  BE VERY CAREFUL not to take multiple medicines containing Tylenol  (also called acetaminophen ). Doing so can lead to an overdose which can damage your liver and cause liver failure and possibly death.   Follow-up instructions: Please follow-up with your primary care provider in the next 3 days for further evaluation of your symptoms.   Return instructions:  Please return to the Emergency Department if you experience worsening symptoms.  You will need to return if you have a persistently high fever not improving with Tylenol , development of pain in the abdomen or flanks, uncontrolled nausea or vomiting especially with inability to hydrate or take your medications Please return if you have any other emergent concerns.  Additional Information:  Your vital signs today were: BP 123/77   Pulse 87   Temp 98.5 F (36.9 C)   Resp 16   SpO2 99%  If your blood pressure (BP) was elevated above 135/85 this visit, please have this  repeated by your doctor within one month. --------------

## 2024-05-01 NOTE — ED Notes (Signed)
 Dc instructions reviewed with patient. Patient voiced understanding. Dc with belongings.

## 2024-05-01 NOTE — ED Notes (Signed)
 Pt noted to be hypotensive, moved to a room w bed, EDP notified

## 2024-05-01 NOTE — ED Provider Notes (Signed)
 [name] Provider Note   CSN: [id number] Arrival date & time: 05/01/24  1447     Patient presents with: Dysuria   Vincent Cantu is a 53 y.o. male.    Patient with history of hypertension, diabetes, chronic kidney disease -- presents to the emergency department for dysuria starting yesterday.  Patient reports remote history of UTI 1 time, but does not have a chronic issue with this.  Last night he developed chills and has had fevers to 102 F, persisting to this afternoon.  Patient took aspirin  at home.  No abdominal pain or flank pain.  No history of kidney stones.  No chest pain, shortness of breath or cough.       Prior to Admission medications   Medication Sig Start Date End Date Taking? Authorizing Provider  amLODipine  (NORVASC ) 10 MG tablet Take 1 tablet (10 mg total) by mouth daily. 02/18/24   Watson Hacking, MD  aspirin  EC 81 MG tablet Take 1 tablet (81 mg total) by mouth daily. Swallow whole. 02/13/23   Revankar, Micael Adas, MD  Blood Glucose Monitoring Suppl DEVI Pt needs a one touch ultra meter Patient not taking: Reported on 11/04/2023 08/20/17   Watson Hacking, MD  cetirizine (ZYRTEC) 10 MG tablet Take 10 mg by mouth at bedtime.    [provider]  Cholecalciferol (VITAMIN D3) 125 MCG (5000 UT) CAPS Take 5,000 Units by mouth daily.    [provider]  Continuous Blood Gluc Receiver (FREESTYLE LIBRE 3 READER) DEVI 1 each by Does not apply route daily. 12/26/22   Watson Hacking, MD  Continuous Glucose Sensor (FREESTYLE LIBRE 3 PLUS SENSOR) MISC PLACE 1 SENSOR ON THE SKIN EVERY 14 DAYS. USE TO CHECK GLUCOSE CONTINUOUSLY 04/13/24   Watson Hacking, MD  diclofenac  (VOLTAREN ) 75 MG EC tablet TAKE 1 TABLET BY MOUTH TWICE A DAY 11/17/23   Brandt Cake, DPM  EDARBYCLOR  40-25 MG TABS TAKE 1 TABLET BY MOUTH ONCE DAILY 01/06/24   Watson Hacking, MD  GARLIC PO Take 1 capsule by mouth daily.    [provider]   Glucagon  (GVOKE HYPOPEN  2-PACK) 1 MG/0.2ML SOAJ INJECT 1 MG INTO THE SKIN 3 TIMES/DAY AS NEEDED-BETWEEN MEALS & BEDTIME. 12/08/23   Tysinger, Christiane Cowing, PA-C  glucose blood test strip Test once a day. Uses one touch ultra meter Patient not taking: Reported on 11/04/2023 08/20/17   Lalonde, John C, MD  Melatonin 10 MG TABS Take 10 mg by mouth at bedtime.    [provider]  meloxicam  (MOBIC ) 15 MG tablet Take 1 tablet (15 mg total) by mouth daily. Patient not taking: Reported on 11/04/2023 06/13/22   Clemetine Cypher, DPM  metoprolol  (TOPROL -XL) 200 MG 24 hr tablet Take 1 tablet (200 mg total) by mouth daily. 02/18/24   Watson Hacking, MD  nitroGLYCERIN  (NITROSTAT ) 0.4 MG SL tablet Place 0.4 mg under the tongue every 5 (five) minutes as needed for chest pain. Patient not taking: Reported on 11/04/2023    [provider]  Raeford Bullion LANCETS FINE MISC Test once a day Patient not taking: Reported on 11/04/2023 08/20/17   Watson Hacking, MD  OVER THE COUNTER MEDICATION Take 1 capsule by mouth daily. Sea Moss with Beet Root    [provider]  pantoprazole  (PROTONIX ) 40 MG tablet Take 1 tablet (40 mg total) by mouth daily. 12/31/22   Trish Furl, MD  rosuvastatin  (CRESTOR ) 40 MG tablet  TAKE 1 TABLET BY MOUTH EVERY DAY 04/27/24   Watson Hacking, MD  tadalafil  (CIALIS ) 20 MG tablet Take 1 tablet (20 mg total) by mouth daily as needed for erectile dysfunction. Patient not taking: Reported on 11/04/2023 06/25/22   Watson Hacking, MD  terazosin  (HYTRIN ) 2 MG capsule TAKE 1 CAPSULE BY MOUTH EVERYDAY AT BEDTIME 02/18/24   Watson Hacking, MD  tirzepatide  (MOUNJARO ) 10 MG/0.5ML Pen Inject 10 mg into the skin once a week. 03/24/24   Tysinger, Christiane Cowing, PA-C    Allergies: Patient has no known allergies.    Review of Systems  Updated Vital Signs BP (!) 97/57   Pulse 89   Temp 98.1 F (36.7 C)   Resp 16   SpO2 97%   Physical Exam Vitals and nursing note reviewed.  Constitutional:       General: He is not in acute distress.    Appearance: He is well-developed.  HENT:     Head: Normocephalic and atraumatic.     Nose: Nose normal.     Mouth/Throat:     Mouth: Mucous membranes are moist.   Eyes:     General:        Right eye: No discharge.        Left eye: No discharge.     Conjunctiva/sclera: Conjunctivae normal.    Cardiovascular:     Rate and Rhythm: Normal rate and regular rhythm.     Heart sounds: Normal heart sounds.  Pulmonary:     Effort: Pulmonary effort is normal.     Breath sounds: Normal breath sounds.  Abdominal:     Palpations: Abdomen is soft.     Tenderness: There is no abdominal tenderness. There is no guarding or rebound.     Comments: Abdomen is soft and nontender   Musculoskeletal:     Cervical back: Normal range of motion and neck supple.   Skin:    General: Skin is warm and dry.   Neurological:     Mental Status: He is alert.    ED Course  Patient seen and examined. History obtained directly from patient.   Labs/EKG: Reviewed and interpreted UA ordered in triage, concerning for infection.  I have added on CBC, CMP, urine culture, lactate.  Patient was hypertensive at time of initial exam.  Mentation normal.  Imaging: Will consider CT imaging depending upon lab workup  Medications/Fluids: Ordered: IV fluid bolus, IV Rocephin.   Most recent vital signs reviewed and are as follows: BP (!) 97/57   Pulse 89   Temp 98.1 F (36.7 C)   Resp 16   SpO2 97%   Initial impression: Complicated UTI, possible sepsis  5:46 PM Reassessment performed. Patient appears stable, states that he is feeling better.  Blood pressure is improved.  Orthostatics are okay.  He remains afebrile.  Orthostatic VS for the past 24 hrs:  BP- Lying Pulse- Lying BP- Sitting Pulse- Sitting BP- Standing at 0 minutes Pulse- Standing at 0 minutes  05/01/24 1718 105/71 84 120/74 86 112/67 85    Labs personally reviewed and interpreted including: CBC with  elevated white blood cell count of 15,000 with absolute neutrophils increased; CMP, creatinine 1.93 with a BUN of 22, this is mildly elevated from baseline over the past 2 years of 1.5-1.8; lactate normal at 1.7.  Reviewed pertinent lab work and imaging with patient at bedside. Questions answered.   Most current vital signs reviewed and are as follows: BP 105/66   Pulse  77   Temp 98.5 F (36.9 C)   Resp 16   SpO2 98%   Plan: Patient trending towards improvement, will discuss outpatient versus inpatient therapy.  Overall patient looks well.  If he is not spiking a fever or having issues with oral medications, likely warrants trial of outpatient therapy.  6:39 PM Reassessment performed. Patient appears stable, continuing to do very well.  I discussed case earlier with Dr. Efraim Grange.  Reviewed pertinent lab work and imaging with patient at bedside.  I again reviewed everything with patient at bedside.  Patient's family member was also on the phone.  We discussed risks and benefits of discharge and admission.  Patient feels comfortable going home at this time.  I think that this is reasonable given his well appearance as well.  Family member to monitor him closely.  He states that he is willing to return if he gets worse.  Most current vital signs reviewed and are as follows: BP 123/77   Pulse 87   Temp 98.5 F (36.9 C)   Resp 16   SpO2 99%   Plan: Discharge to home.   Prescriptions written for: Cipro twice daily x 10 days, Zofran   Other home care instructions discussed: Maintain good hydration  ED return instructions discussed: Discussed need to return with high persistent fever, persistent vomiting, development of pain in the abdomen or flank, new or worsening symptoms  Follow-up instructions discussed: Patient encouraged to follow-up with their PCP in 3 days.  He is established with [facility].   (all labs ordered are listed, but only abnormal results are displayed) Labs  Reviewed  URINALYSIS, ROUTINE W REFLEX MICROSCOPIC - Abnormal; Notable for the following components:      Result Value   Color, Urine ORANGE (*)    APPearance HAZY (*)    Hgb urine dipstick TRACE (*)    Protein, ur TRACE (*)    Leukocytes,Ua LARGE (*)    Bacteria, UA FEW (*)    All other components within normal limits  CBC WITH DIFFERENTIAL/PLATELET - Abnormal; Notable for the following components:   WBC 15.0 (*)    Neutro Abs 11.9 (*)    Abs Immature Granulocytes 0.17 (*)    All other components within normal limits  COMPREHENSIVE METABOLIC PANEL WITH GFR - Abnormal; Notable for the following components:   Potassium 3.4 (*)    CO2 21 (*)    Glucose, Bld 133 (*)    BUN 22 (*)    Creatinine, Ser 1.93 (*)    Total Bilirubin 1.6 (*)    GFR, Estimated 40 (*)    Anion gap 17 (*)    All other components within normal limits  URINE CULTURE  LACTIC ACID, PLASMA    EKG: None  Radiology: No results found.   Procedures   Medications Ordered in the ED  sodium chloride  0.9 % bolus 1,000 mL (1,000 mLs Intravenous New Bag/Given 05/01/24 1623)  cefTRIAXone (ROCEPHIN) 2 g in sodium chloride  0.9 % 100 mL IVPB (2 g Intravenous New Bag/Given 05/01/24 1624)                                    Medical Decision Making Amount and/or Complexity of Data Reviewed Labs: ordered.   Patient presents with dysuria and fever at home.  Patient was hypotensive during blood draw here.  Patient was evaluated given associated comorbidities of chronic kidney disease and diabetes.  Patient responded well to IV fluids.  He received 2 g Rocephin for his UTI.  At this time, no recurrence of fever.  No flank pain or abdominal pain on exam.  Possible sources of infection include pyelonephritis, cystitis, prostatitis.  Patient has not been vomiting.  Lactate is normal.  White blood cell count elevated as expected.  Mildly elevated creatinine above baseline, but only mildly so.  Patient looks well, nontoxic.  Low  concern for severe sepsis at this time.  Given clinical improvement and well appearance, we agreed for trial of treatment at home.  He seems reliable to return if worsening.  Family member also involved in the decision-making process and agrees.  The patient's vital signs, pertinent lab work and imaging were reviewed and interpreted as discussed in the ED course. Hospitalization was considered for further testing, treatments, or serial exams/observation. However as patient is well-appearing, has a stable exam, and reassuring studies today, I do not feel that they warrant admission at this time. This plan was discussed with the patient who verbalizes agreement and comfort with this plan and seems reliable and able to return to the Emergency Department with worsening or changing symptoms.       Final diagnoses:  Complicated UTI (urinary tract infection)    ED Discharge Orders          Ordered    ciprofloxacin (CIPRO) 500 MG tablet  Every 12 hours        05/01/24 1834    ondansetron  (ZOFRAN -ODT) 4 MG disintegrating tablet  Every 8 hours PRN        05/01/24 1834               Corneilus Heggie, PA-C 05/01/24 1844    Ninetta Basket, MD 05/04/24 1655

## 2024-05-01 NOTE — ED Notes (Signed)
 Pt drank gingerale, tolerated well.

## 2024-05-02 ENCOUNTER — Other Ambulatory Visit: Payer: Self-pay | Admitting: Family Medicine

## 2024-05-02 DIAGNOSIS — I152 Hypertension secondary to endocrine disorders: Secondary | ICD-10-CM

## 2024-05-04 LAB — URINE CULTURE: Culture: >=100000 — AB

## 2024-05-05 ENCOUNTER — Telehealth (HOSPITAL_BASED_OUTPATIENT_CLINIC_OR_DEPARTMENT_OTHER): Payer: Self-pay

## 2024-05-05 NOTE — Telephone Encounter (Signed)
 Post ED Visit - Positive Culture Follow-up  Culture report reviewed by antimicrobial stewardship pharmacist: [facility] [x]  [place], [place].D. []  Skeet Duke, Pharm.D., BCPS AQ-ID []  Leslee Rase, Pharm.D., BCPS []  Garland Junk, Pharm.D., BCPS []  [place], [address].D., BCPS, AAHIVP []  Alcide Aly, Pharm.D., BCPS, AAHIVP []  Jerri Morale, PharmD, BCPS []  Graham Laws, PharmD, BCPS []  Cleda Curly, PharmD, BCPS []  Tamar Fairly, PharmD []  Ballard Levels, PharmD, BCPS []  Ollen Beverage, PharmD  [facility] []  Arlyne Bering, PharmD []  Sherryle Don, PharmD []  Van Gelinas, PharmD []  Delila Felty, Rph []  [place]) Cleora Daft, PharmD []  Augustina Block, PharmD []  Arie Kurtz, PharmD []  Sharlyn Deaner, PharmD []  Agnes Hose, PharmD []  Kendall Pauls, PharmD []  Gladstone Lamer, PharmD []  Armanda Bern, PharmD []  Tera Fellows, PharmD   Positive urine culture Treated with Ciprofloxacin, organism sensitive to the same and no further patient follow-up is required at this time.  Delena Feil 05/05/2024, 9:01 AM

## 2024-05-11 ENCOUNTER — Other Ambulatory Visit: Payer: Self-pay | Admitting: Family Medicine

## 2024-05-11 DIAGNOSIS — I152 Hypertension secondary to endocrine disorders: Secondary | ICD-10-CM

## 2024-06-02 LAB — HM DIABETES EYE EXAM

## 2024-06-09 ENCOUNTER — Other Ambulatory Visit: Payer: Self-pay | Admitting: Medical

## 2024-06-09 NOTE — Telephone Encounter (Signed)
Pt was increased

## 2024-06-14 ENCOUNTER — Telehealth: Payer: Self-pay

## 2024-06-14 ENCOUNTER — Other Ambulatory Visit (HOSPITAL_COMMUNITY): Payer: Self-pay

## 2024-06-14 NOTE — Telephone Encounter (Signed)
 Pharmacy Patient Advocate Encounter   Received notification from CoverMyMeds that prior authorization for Mounjaro  12.5MG /0.5ML auto-injectors is required/requested.   Insurance verification completed.   The patient is insured through [employer] .   Per test claim: PA required; PA submitted to above mentioned insurance via CoverMyMeds Key/confirmation #/EOC (Key: [id number])   Status is pending

## 2024-06-15 ENCOUNTER — Other Ambulatory Visit (HOSPITAL_COMMUNITY): Payer: Self-pay

## 2024-06-15 NOTE — Telephone Encounter (Signed)
 Pharmacy Patient Advocate Encounter  Received notification from [facility] that Prior Authorization for Mounjaro  12.5MG /0.5ML auto-injectors has been APPROVED from 6.29.25 to 7.29.26. Ran test claim, Copay is $0.00. This test claim was processed through [facility]- copay amounts may vary at other pharmacies due to pharmacy/plan contracts, or as the patient moves through the different stages of their insurance plan.   PA #/Case ID/Reference #: (Key: [id number])

## 2024-07-24 ENCOUNTER — Other Ambulatory Visit: Payer: Self-pay | Admitting: Family Medicine

## 2024-07-24 ENCOUNTER — Other Ambulatory Visit: Payer: Self-pay | Admitting: Medical

## 2024-07-24 DIAGNOSIS — E118 Type 2 diabetes mellitus with unspecified complications: Secondary | ICD-10-CM

## 2024-07-26 NOTE — Telephone Encounter (Signed)
Dose was increased to 10 mg

## 2024-08-10 ENCOUNTER — Other Ambulatory Visit: Payer: Self-pay | Admitting: Family Medicine

## 2024-08-10 ENCOUNTER — Ambulatory Visit: Admitting: Cardiology

## 2024-08-10 DIAGNOSIS — E1159 Type 2 diabetes mellitus with other circulatory complications: Secondary | ICD-10-CM

## 2024-08-12 ENCOUNTER — Other Ambulatory Visit

## 2024-08-19 ENCOUNTER — Other Ambulatory Visit (INDEPENDENT_AMBULATORY_CARE_PROVIDER_SITE_OTHER)

## 2024-08-19 DIAGNOSIS — Z23 Encounter for immunization: Secondary | ICD-10-CM | POA: Diagnosis not present

## 2024-09-14 ENCOUNTER — Encounter (INDEPENDENT_AMBULATORY_CARE_PROVIDER_SITE_OTHER): Payer: Self-pay

## 2024-10-10 ENCOUNTER — Other Ambulatory Visit: Payer: Self-pay | Admitting: Family Medicine

## 2024-10-10 DIAGNOSIS — E118 Type 2 diabetes mellitus with unspecified complications: Secondary | ICD-10-CM

## 2024-10-11 ENCOUNTER — Ambulatory Visit: Payer: Self-pay | Admitting: Family Medicine

## 2024-10-11 ENCOUNTER — Encounter: Payer: Self-pay | Admitting: Family Medicine

## 2024-10-11 VITALS — BP 124/84 | HR 75 | Ht 75.0 in | Wt 324.2 lb

## 2024-10-11 DIAGNOSIS — Z23 Encounter for immunization: Secondary | ICD-10-CM

## 2024-10-11 DIAGNOSIS — E785 Hyperlipidemia, unspecified: Secondary | ICD-10-CM

## 2024-10-11 DIAGNOSIS — E1169 Type 2 diabetes mellitus with other specified complication: Secondary | ICD-10-CM | POA: Diagnosis not present

## 2024-10-11 DIAGNOSIS — Z0000 Encounter for general adult medical examination without abnormal findings: Secondary | ICD-10-CM | POA: Diagnosis not present

## 2024-10-11 DIAGNOSIS — E1159 Type 2 diabetes mellitus with other circulatory complications: Secondary | ICD-10-CM | POA: Diagnosis not present

## 2024-10-11 DIAGNOSIS — I152 Hypertension secondary to endocrine disorders: Secondary | ICD-10-CM

## 2024-10-11 DIAGNOSIS — E118 Type 2 diabetes mellitus with unspecified complications: Secondary | ICD-10-CM | POA: Diagnosis not present

## 2024-10-11 DIAGNOSIS — M25511 Pain in right shoulder: Secondary | ICD-10-CM

## 2024-10-11 DIAGNOSIS — Z7985 Long-term (current) use of injectable non-insulin antidiabetic drugs: Secondary | ICD-10-CM

## 2024-10-11 DIAGNOSIS — M25512 Pain in left shoulder: Secondary | ICD-10-CM

## 2024-10-11 DIAGNOSIS — G8929 Other chronic pain: Secondary | ICD-10-CM

## 2024-10-11 LAB — POCT GLYCOSYLATED HEMOGLOBIN (HGB A1C): Hemoglobin A1C: 6.8 % — AB (ref 4.0–5.6)

## 2024-10-11 LAB — LIPID PANEL

## 2024-10-11 MED ORDER — TIRZEPATIDE 12.5 MG/0.5ML ~~LOC~~ SOAJ
12.5000 mg | SUBCUTANEOUS | 2 refills | Status: AC
Start: 2024-10-11 — End: ?

## 2024-10-11 NOTE — Progress Notes (Signed)
 Name: Vincent Cantu   Date of Visit: 10/11/24   Date of last visit with me: Visit date not found   CHIEF COMPLAINT:  Chief Complaint  Patient presents with   Annual Exam    Cpe. Fasting. Getting bloated and gas pains.        HPI:  Discussed the use of AI scribe software for clinical note transcription with the patient, who gave verbal consent to proceed.  History of Present Illness   Vincent Cantu is a 53 year old male who presents with gas trapping and intermittent stabbing abdominal pain.  He experiences gas trapping on both sides of his abdomen, leading to intermittent stabbing pain. The pain is random and not associated with any specific triggers. He drinks about 1.5 liters of water daily, though his intake is less consistent on weekends. He has not been using any fiber supplements for these symptoms, but he does have Miralax  at home.  He is currently using Mounjaro  for weight management but has not experienced any weight loss. He notes a change in his eating habits, eating less frequently and only when hungry. He has previously been on a higher dose of Mounjaro , which affected his blood sugar levels.  He engages in regular physical activity, walking between 7,000 to 8,000 steps daily, with a goal to increase this to 10,000 steps. His family includes his wife, Jasiyah Paulding, and his children, Mikolaj Woolstenhulme and Jordan Green.         OBJECTIVE:       10/11/2024    8:10 AM  Depression screen PHQ 2/9  Decreased Interest 0  Down, Depressed, Hopeless 0  PHQ - 2 Score 0     BP Readings from Last 3 Encounters:  10/11/24 124/84  05/01/24 123/77  11/04/23 138/84    BP 124/84   Pulse 75   Ht 6' 3 (1.905 m)   Wt (!) 324 lb 3.2 oz (147.1 kg)   SpO2 97%   BMI 40.52 kg/m    Physical Exam          Physical Exam Constitutional:      Appearance: Normal appearance.  Neurological:     General: No focal deficit present.     Mental Status: He is alert and  oriented to person, place, and time. Mental status is at baseline.     ASSESSMENT/PLAN:   Assessment & Plan Annual physical exam  Type 2 diabetes with complication (HCC)  Hypertension associated with diabetes (HCC)  Hyperlipidemia associated with type 2 diabetes mellitus (HCC)  Morbid obesity (HCC)  Need for pneumococcal 20-valent conjugate vaccination  Chronic pain of both shoulders    Assessment and Plan    Adult Wellness Visit Routine visit with well-controlled blood pressure and overall good health. Weight management is a concern. - Ordered basic labs including A1c. - Encouraged increasing daily steps to 10,000. - Comprehensive annual physical exam completed today. Reviewed interval history, current medical issues, medications, allergies, and preventive care needs. Addressed all patient questions and concerns. Discussed lifestyle factors including diet, exercise, sleep, and stress management. Reviewed recommended age-appropriate screenings, labs, and vaccinations. Counseling provided on healthy habits and routine health maintenance. Follow-up as indicated based on findings and results. - PCV today   Constipation Intermittent gas and stabbing pain likely due to constipation. Current water intake is below recommended levels. No fiber supplements taken. - Increase water intake to 1.75 liters per day. - Use Miralax  morning and evening for 3 days, then daily. -  Increase dietary fiber intake to 20-30 grams per day.  Type 2 diabetes mellitus Type 2 diabetes managed with Mounjaro . Current dose is 10 mg, with sensitivity to higher doses. Blood sugar levels stable, but no weight loss. Discussed Mounjaro  mechanism and necessity-based eating. - Increased Mounjaro  dose to 12.5 mg after completing current 10 mg supply. - Monitor for tolerance to new dose and adjust if necessary.  Morbid obesity Weight loss not achieved despite treatment. Discussed dietary habits and physical activity  importance. - Encouraged reducing eating frequency and increasing physical activity. - Encouraged aiming for 10,000 steps per day.   Bilateral shoulder pain - Patient with history of bilateral shoulder pain, pain may be related to cervical radiculopathy but patient does note some dull aching pain that is specified to the shoulder. - Patient also notes that he has dislocated both shoulders in the past. - Will go get bilateral x-rays of the shoulders to evaluate for any arthritic possibility.        Taisley Mordan A. Vita MD [facility]

## 2024-10-12 ENCOUNTER — Ambulatory Visit: Payer: Self-pay | Admitting: Family Medicine

## 2024-10-12 DIAGNOSIS — M5412 Radiculopathy, cervical region: Secondary | ICD-10-CM

## 2024-10-12 LAB — COMPREHENSIVE METABOLIC PANEL WITH GFR
ALT: 28 IU/L (ref 0–44)
AST: 30 IU/L (ref 0–40)
Albumin: 4.3 g/dL (ref 3.8–4.9)
Alkaline Phosphatase: 76 IU/L (ref 47–123)
BUN/Creatinine Ratio: 11 (ref 9–20)
BUN: 16 mg/dL (ref 6–24)
Bilirubin Total: 0.5 mg/dL (ref 0.0–1.2)
CO2: 22 mmol/L (ref 20–29)
Calcium: 9.7 mg/dL (ref 8.7–10.2)
Chloride: 103 mmol/L (ref 96–106)
Creatinine, Ser: 1.5 mg/dL — ABNORMAL HIGH (ref 0.76–1.27)
Globulin, Total: 3 g/dL (ref 1.5–4.5)
Glucose: 141 mg/dL — ABNORMAL HIGH (ref 70–99)
Potassium: 3.5 mmol/L (ref 3.5–5.2)
Sodium: 141 mmol/L (ref 134–144)
Total Protein: 7.3 g/dL (ref 6.0–8.5)
eGFR: 55 mL/min/1.73 — ABNORMAL LOW (ref >59)

## 2024-10-12 LAB — CBC WITH DIFFERENTIAL/PLATELET
Basophils Absolute: 0.1 x10E3/uL (ref 0.0–0.2)
Basos: 1 %
EOS (ABSOLUTE): 0.1 x10E3/uL (ref 0.0–0.4)
Eos: 2 %
Hematocrit: 41.3 % (ref 37.5–51.0)
Hemoglobin: 13.2 g/dL (ref 13.0–17.7)
Immature Grans (Abs): 0 x10E3/uL (ref 0.0–0.1)
Immature Granulocytes: 0 %
Lymphocytes Absolute: 3.1 x10E3/uL (ref 0.7–3.1)
Lymphs: 44 %
MCH: 26.6 pg (ref 26.6–33.0)
MCHC: 32 g/dL (ref 31.5–35.7)
MCV: 83 fL (ref 79–97)
Monocytes Absolute: 0.8 x10E3/uL (ref 0.1–0.9)
Monocytes: 11 %
Neutrophils Absolute: 3 x10E3/uL (ref 1.4–7.0)
Neutrophils: 42 %
Platelets: 197 x10E3/uL (ref 150–450)
RBC: 4.97 x10E6/uL (ref 4.14–5.80)
RDW: 14 % (ref 11.6–15.4)
WBC: 7.1 x10E3/uL (ref 3.4–10.8)

## 2024-10-12 LAB — LIPID PANEL
Cholesterol, Total: 104 mg/dL (ref 100–199)
HDL: 33 mg/dL — AB (ref >39)
LDL CALC COMMENT:: 3.2 ratio (ref 0.0–5.0)
LDL Chol Calc (NIH): 52 mg/dL (ref 0–99)
Triglycerides: 102 mg/dL (ref 0–149)
VLDL Cholesterol Cal: 19 mg/dL (ref 5–40)

## 2024-10-12 LAB — HIV ANTIBODY (ROUTINE TESTING W REFLEX): HIV Screen 4th Generation wRfx: NONREACTIVE

## 2024-10-13 ENCOUNTER — Other Ambulatory Visit: Payer: Self-pay | Admitting: Family Medicine

## 2024-10-13 DIAGNOSIS — E1169 Type 2 diabetes mellitus with other specified complication: Secondary | ICD-10-CM

## 2024-10-13 DIAGNOSIS — I152 Hypertension secondary to endocrine disorders: Secondary | ICD-10-CM

## 2024-11-02 ENCOUNTER — Other Ambulatory Visit: Payer: Self-pay | Admitting: Family Medicine

## 2024-11-02 DIAGNOSIS — I152 Hypertension secondary to endocrine disorders: Secondary | ICD-10-CM

## 2024-11-19 ENCOUNTER — Ambulatory Visit
Admission: RE | Admit: 2024-11-19 | Discharge: 2024-11-19 | Disposition: A | Discharge status: Home / Self Care | Source: Ambulatory Visit | Attending: Family Medicine | Admitting: Family Medicine

## 2024-11-19 DIAGNOSIS — G8929 Other chronic pain: Secondary | ICD-10-CM

## 2024-12-18 ENCOUNTER — Other Ambulatory Visit

## 2024-12-23 ENCOUNTER — Other Ambulatory Visit: Payer: Self-pay | Admitting: Medical

## 2024-12-23 DIAGNOSIS — E118 Type 2 diabetes mellitus with unspecified complications: Secondary | ICD-10-CM

## 2024-12-23 NOTE — Telephone Encounter (Signed)
 Med was sent in already for 12.5mg  after he finished 10mg 

## 2024-12-25 ENCOUNTER — Other Ambulatory Visit

## 2025-01-12 ENCOUNTER — Ambulatory Visit: Admitting: Cardiology

## 2025-10-12 ENCOUNTER — Encounter: Admitting: Family Medicine
